# Patient Record
Sex: Male | Born: 1990 | Race: Black or African American | Hispanic: No | Marital: Single
Health system: Southern US, Community
[De-identification: ages and names within clinical notes are randomized; demographics above are authoritative.]

## PROBLEM LIST (undated history)

## (undated) DIAGNOSIS — F1910 Other psychoactive substance abuse, uncomplicated: Secondary | ICD-10-CM

## (undated) DIAGNOSIS — I469 Cardiac arrest, cause unspecified: Secondary | ICD-10-CM

---

## 2019-07-25 ENCOUNTER — Inpatient Hospital Stay (HOSPITAL_COMMUNITY)
Admission: EM | Admit: 2019-07-25 | Discharge: 2019-09-13 | DRG: 004 | Disposition: A | Discharge status: Home / Self Care | Payer: Self-pay | Attending: Internal Medicine | Admitting: Internal Medicine

## 2019-07-25 ENCOUNTER — Inpatient Hospital Stay (HOSPITAL_COMMUNITY): Payer: Self-pay

## 2019-07-25 ENCOUNTER — Emergency Department (HOSPITAL_COMMUNITY): Payer: Self-pay

## 2019-07-25 DIAGNOSIS — A419 Sepsis, unspecified organism: Secondary | ICD-10-CM | POA: Diagnosis present

## 2019-07-25 DIAGNOSIS — T50901A Poisoning by unspecified drugs, medicaments and biological substances, accidental (unintentional), initial encounter: Secondary | ICD-10-CM | POA: Diagnosis present

## 2019-07-25 DIAGNOSIS — Z781 Physical restraint status: Secondary | ICD-10-CM

## 2019-07-25 DIAGNOSIS — Z0189 Encounter for other specified special examinations: Secondary | ICD-10-CM

## 2019-07-25 DIAGNOSIS — N170 Acute kidney failure with tubular necrosis: Secondary | ICD-10-CM | POA: Diagnosis present

## 2019-07-25 DIAGNOSIS — Z20822 Contact with and (suspected) exposure to covid-19: Secondary | ICD-10-CM | POA: Diagnosis present

## 2019-07-25 DIAGNOSIS — J9690 Respiratory failure, unspecified, unspecified whether with hypoxia or hypercapnia: Secondary | ICD-10-CM

## 2019-07-25 DIAGNOSIS — F1210 Cannabis abuse, uncomplicated: Secondary | ICD-10-CM | POA: Diagnosis present

## 2019-07-25 DIAGNOSIS — R4587 Impulsiveness: Secondary | ICD-10-CM | POA: Diagnosis not present

## 2019-07-25 DIAGNOSIS — R159 Full incontinence of feces: Secondary | ICD-10-CM | POA: Diagnosis not present

## 2019-07-25 DIAGNOSIS — J9811 Atelectasis: Secondary | ICD-10-CM | POA: Diagnosis present

## 2019-07-25 DIAGNOSIS — R410 Disorientation, unspecified: Secondary | ICD-10-CM | POA: Diagnosis not present

## 2019-07-25 DIAGNOSIS — Z22322 Carrier or suspected carrier of Methicillin resistant Staphylococcus aureus: Secondary | ICD-10-CM

## 2019-07-25 DIAGNOSIS — Z9911 Dependence on respirator [ventilator] status: Secondary | ICD-10-CM

## 2019-07-25 DIAGNOSIS — D62 Acute posthemorrhagic anemia: Secondary | ICD-10-CM | POA: Diagnosis not present

## 2019-07-25 DIAGNOSIS — R1310 Dysphagia, unspecified: Secondary | ICD-10-CM | POA: Diagnosis present

## 2019-07-25 DIAGNOSIS — E870 Hyperosmolality and hypernatremia: Secondary | ICD-10-CM | POA: Diagnosis present

## 2019-07-25 DIAGNOSIS — Z4659 Encounter for fitting and adjustment of other gastrointestinal appliance and device: Secondary | ICD-10-CM

## 2019-07-25 DIAGNOSIS — Z9689 Presence of other specified functional implants: Secondary | ICD-10-CM

## 2019-07-25 DIAGNOSIS — I468 Cardiac arrest due to other underlying condition: Secondary | ICD-10-CM | POA: Diagnosis present

## 2019-07-25 DIAGNOSIS — Z930 Tracheostomy status: Secondary | ICD-10-CM

## 2019-07-25 DIAGNOSIS — M6282 Rhabdomyolysis: Secondary | ICD-10-CM | POA: Diagnosis present

## 2019-07-25 DIAGNOSIS — E860 Dehydration: Secondary | ICD-10-CM | POA: Diagnosis not present

## 2019-07-25 DIAGNOSIS — I5030 Unspecified diastolic (congestive) heart failure: Secondary | ICD-10-CM | POA: Diagnosis present

## 2019-07-25 DIAGNOSIS — F1020 Alcohol dependence, uncomplicated: Secondary | ICD-10-CM | POA: Diagnosis present

## 2019-07-25 DIAGNOSIS — I4891 Unspecified atrial fibrillation: Secondary | ICD-10-CM | POA: Diagnosis present

## 2019-07-25 DIAGNOSIS — D72829 Elevated white blood cell count, unspecified: Secondary | ICD-10-CM | POA: Diagnosis present

## 2019-07-25 DIAGNOSIS — J939 Pneumothorax, unspecified: Secondary | ICD-10-CM

## 2019-07-25 DIAGNOSIS — R680 Hypothermia, not associated with low environmental temperature: Secondary | ICD-10-CM | POA: Diagnosis present

## 2019-07-25 DIAGNOSIS — D6489 Other specified anemias: Secondary | ICD-10-CM | POA: Diagnosis not present

## 2019-07-25 DIAGNOSIS — E874 Mixed disorder of acid-base balance: Secondary | ICD-10-CM | POA: Diagnosis present

## 2019-07-25 DIAGNOSIS — F1910 Other psychoactive substance abuse, uncomplicated: Secondary | ICD-10-CM | POA: Diagnosis present

## 2019-07-25 DIAGNOSIS — F17210 Nicotine dependence, cigarettes, uncomplicated: Secondary | ICD-10-CM | POA: Diagnosis present

## 2019-07-25 DIAGNOSIS — G931 Anoxic brain damage, not elsewhere classified: Secondary | ICD-10-CM | POA: Diagnosis present

## 2019-07-25 DIAGNOSIS — N179 Acute kidney failure, unspecified: Secondary | ICD-10-CM | POA: Diagnosis present

## 2019-07-25 DIAGNOSIS — R6521 Severe sepsis with septic shock: Secondary | ICD-10-CM | POA: Diagnosis present

## 2019-07-25 DIAGNOSIS — M25511 Pain in right shoulder: Secondary | ICD-10-CM | POA: Diagnosis not present

## 2019-07-25 DIAGNOSIS — J9621 Acute and chronic respiratory failure with hypoxia: Secondary | ICD-10-CM | POA: Diagnosis not present

## 2019-07-25 DIAGNOSIS — K7200 Acute and subacute hepatic failure without coma: Secondary | ICD-10-CM | POA: Diagnosis present

## 2019-07-25 DIAGNOSIS — I469 Cardiac arrest, cause unspecified: Secondary | ICD-10-CM

## 2019-07-25 DIAGNOSIS — J690 Pneumonitis due to inhalation of food and vomit: Secondary | ICD-10-CM | POA: Diagnosis present

## 2019-07-25 DIAGNOSIS — E876 Hypokalemia: Secondary | ICD-10-CM | POA: Diagnosis not present

## 2019-07-25 DIAGNOSIS — T40602A Poisoning by unspecified narcotics, intentional self-harm, initial encounter: Principal | ICD-10-CM | POA: Diagnosis present

## 2019-07-25 DIAGNOSIS — K2970 Gastritis, unspecified, without bleeding: Secondary | ICD-10-CM | POA: Diagnosis present

## 2019-07-25 DIAGNOSIS — Z818 Family history of other mental and behavioral disorders: Secondary | ICD-10-CM

## 2019-07-25 DIAGNOSIS — K5900 Constipation, unspecified: Secondary | ICD-10-CM | POA: Diagnosis not present

## 2019-07-25 DIAGNOSIS — I10 Essential (primary) hypertension: Secondary | ICD-10-CM | POA: Clinically undetermined

## 2019-07-25 DIAGNOSIS — J189 Pneumonia, unspecified organism: Secondary | ICD-10-CM

## 2019-07-25 DIAGNOSIS — E162 Hypoglycemia, unspecified: Secondary | ICD-10-CM | POA: Diagnosis present

## 2019-07-25 DIAGNOSIS — I959 Hypotension, unspecified: Secondary | ICD-10-CM | POA: Diagnosis present

## 2019-07-25 DIAGNOSIS — J95811 Postprocedural pneumothorax: Secondary | ICD-10-CM | POA: Diagnosis present

## 2019-07-25 DIAGNOSIS — J156 Pneumonia due to other aerobic Gram-negative bacteria: Secondary | ICD-10-CM | POA: Diagnosis present

## 2019-07-25 DIAGNOSIS — Z91010 Allergy to peanuts: Secondary | ICD-10-CM

## 2019-07-25 DIAGNOSIS — I472 Ventricular tachycardia: Secondary | ICD-10-CM | POA: Diagnosis present

## 2019-07-25 DIAGNOSIS — R1319 Other dysphagia: Secondary | ICD-10-CM | POA: Diagnosis not present

## 2019-07-25 DIAGNOSIS — I110 Hypertensive heart disease with heart failure: Secondary | ICD-10-CM | POA: Diagnosis present

## 2019-07-25 DIAGNOSIS — F919 Conduct disorder, unspecified: Secondary | ICD-10-CM | POA: Diagnosis not present

## 2019-07-25 DIAGNOSIS — E875 Hyperkalemia: Secondary | ICD-10-CM

## 2019-07-25 DIAGNOSIS — T17890A Other foreign object in other parts of respiratory tract causing asphyxiation, initial encounter: Secondary | ICD-10-CM | POA: Diagnosis present

## 2019-07-25 DIAGNOSIS — Z452 Encounter for adjustment and management of vascular access device: Secondary | ICD-10-CM

## 2019-07-25 DIAGNOSIS — D6959 Other secondary thrombocytopenia: Secondary | ICD-10-CM | POA: Diagnosis present

## 2019-07-25 DIAGNOSIS — F4321 Adjustment disorder with depressed mood: Secondary | ICD-10-CM | POA: Diagnosis present

## 2019-07-25 DIAGNOSIS — J9383 Other pneumothorax: Secondary | ICD-10-CM

## 2019-07-25 DIAGNOSIS — J9601 Acute respiratory failure with hypoxia: Secondary | ICD-10-CM | POA: Diagnosis present

## 2019-07-25 DIAGNOSIS — F1410 Cocaine abuse, uncomplicated: Secondary | ICD-10-CM | POA: Diagnosis present

## 2019-07-25 DIAGNOSIS — R109 Unspecified abdominal pain: Secondary | ICD-10-CM

## 2019-07-25 DIAGNOSIS — G9341 Metabolic encephalopathy: Secondary | ICD-10-CM | POA: Diagnosis present

## 2019-07-25 DIAGNOSIS — R509 Fever, unspecified: Secondary | ICD-10-CM | POA: Diagnosis not present

## 2019-07-25 HISTORY — DX: Other psychoactive substance abuse, uncomplicated: F19.10

## 2019-07-25 HISTORY — DX: Cardiac arrest, cause unspecified: I46.9

## 2019-07-25 LAB — I-STAT VENOUS BLOOD GAS, ED
Acid-base deficit: 18 mmol/L — ABNORMAL HIGH (ref 0.0–2.0)
Bicarbonate: 16.9 mmol/L — ABNORMAL LOW (ref 20.0–28.0)
Calcium, Ion: 0.87 mmol/L — CL (ref 1.15–1.40)
HCT: 56 % — ABNORMAL HIGH (ref 39.0–52.0)
Hemoglobin: 19 g/dL — ABNORMAL HIGH (ref 13.0–17.0)
O2 Saturation: 71 %
Potassium: 6.6 mmol/L (ref 3.5–5.1)
Sodium: 140 mmol/L (ref 135–145)
TCO2: 19 mmol/L — ABNORMAL LOW (ref 22–32)
pCO2, Ven: 78 mmHg (ref 44.0–60.0)
pH, Ven: 6.943 — CL (ref 7.250–7.430)
pO2, Ven: 60 mmHg — ABNORMAL HIGH (ref 32.0–45.0)

## 2019-07-25 LAB — SODIUM, URINE, RANDOM: Sodium, Ur: 76 mmol/L

## 2019-07-25 LAB — BASIC METABOLIC PANEL
Anion gap: 9 (ref 5–15)
Anion gap: 12 (ref 5–15)
BUN: 21 mg/dL — ABNORMAL HIGH (ref 6–20)
BUN: 22 mg/dL — ABNORMAL HIGH (ref 6–20)
CO2: 29 mmol/L (ref 22–32)
CO2: 28 mmol/L (ref 22–32)
Calcium: 6.2 mg/dL — CL (ref 8.9–10.3)
Calcium: 6.6 mg/dL — ABNORMAL LOW (ref 8.9–10.3)
Chloride: 104 mmol/L (ref 98–111)
Chloride: 100 mmol/L (ref 98–111)
Creatinine, Ser: 3.16 mg/dL — ABNORMAL HIGH (ref 0.61–1.24)
Creatinine, Ser: 2.9 mg/dL — ABNORMAL HIGH (ref 0.61–1.24)
GFR calc Af Amer: 29 mL/min — ABNORMAL LOW (ref >60)
GFR calc Af Amer: 33 mL/min — ABNORMAL LOW (ref >60)
GFR calc non Af Amer: 25 mL/min — ABNORMAL LOW (ref >60)
GFR calc non Af Amer: 28 mL/min — ABNORMAL LOW (ref >60)
Glucose, Bld: 154 mg/dL — ABNORMAL HIGH (ref 70–99)
Glucose, Bld: 90 mg/dL (ref 70–99)
Potassium: >7.5 mmol/L (ref 3.5–5.1)
Potassium: 5.7 mmol/L — ABNORMAL HIGH (ref 3.5–5.1)
Sodium: 142 mmol/L (ref 135–145)
Sodium: 140 mmol/L (ref 135–145)

## 2019-07-25 LAB — CBC
HCT: 58.2 % — ABNORMAL HIGH (ref 39.0–52.0)
Hemoglobin: 17.1 g/dL — ABNORMAL HIGH (ref 13.0–17.0)
MCH: 29.3 pg (ref 26.0–34.0)
MCHC: 29.4 g/dL — ABNORMAL LOW (ref 30.0–36.0)
MCV: 99.8 fL (ref 80.0–100.0)
Platelets: 262 10*3/uL (ref 150–400)
RBC: 5.83 MIL/uL — ABNORMAL HIGH (ref 4.22–5.81)
RDW: 16.9 % — ABNORMAL HIGH (ref 11.5–15.5)
WBC: 19 10*3/uL — ABNORMAL HIGH (ref 4.0–10.5)
nRBC: 1.5 % — ABNORMAL HIGH (ref 0.0–0.2)

## 2019-07-25 LAB — URINALYSIS, ROUTINE W REFLEX MICROSCOPIC
Bilirubin Urine: NEGATIVE
Glucose, UA: 50 mg/dL — AB
Ketones, ur: NEGATIVE mg/dL
Leukocytes,Ua: NEGATIVE
Nitrite: NEGATIVE
Protein, ur: >=300 mg/dL — AB
Specific Gravity, Urine: 1.014 (ref 1.005–1.030)
pH: 5 (ref 5.0–8.0)

## 2019-07-25 LAB — HEPATIC FUNCTION PANEL
ALT: 783 U/L — ABNORMAL HIGH (ref 0–44)
AST: 2136 U/L — ABNORMAL HIGH (ref 15–41)
Albumin: 2.7 g/dL — ABNORMAL LOW (ref 3.5–5.0)
Alkaline Phosphatase: 89 U/L (ref 38–126)
Bilirubin, Direct: 0.7 mg/dL — ABNORMAL HIGH (ref 0.0–0.2)
Indirect Bilirubin: 1.7 mg/dL — ABNORMAL HIGH (ref 0.3–0.9)
Total Bilirubin: 2.4 mg/dL — ABNORMAL HIGH (ref 0.3–1.2)
Total Protein: 5.4 g/dL — ABNORMAL LOW (ref 6.5–8.1)

## 2019-07-25 LAB — BLOOD GAS, ARTERIAL
Acid-base deficit: 0.9 mmol/L (ref 0.0–2.0)
Bicarbonate: 29.3 mmol/L — ABNORMAL HIGH (ref 20.0–28.0)
Drawn by: 441371
FIO2: 100
O2 Saturation: 95.1 %
Patient temperature: 35.1
pCO2 arterial: 107 mmHg (ref 32.0–48.0)
pH, Arterial: 7.052 — CL (ref 7.350–7.450)
pO2, Arterial: 83 mmHg (ref 83.0–108.0)

## 2019-07-25 LAB — LACTIC ACID, PLASMA
Lactic Acid, Venous: 4.8 mmol/L (ref 0.5–1.9)
Lactic Acid, Venous: 4.6 mmol/L (ref 0.5–1.9)

## 2019-07-25 LAB — PROTIME-INR
INR: 1.4 — ABNORMAL HIGH (ref 0.8–1.2)
Prothrombin Time: 16.7 seconds — ABNORMAL HIGH (ref 11.4–15.2)

## 2019-07-25 LAB — I-STAT CHEM 8, ED
BUN: 24 mg/dL — ABNORMAL HIGH (ref 6–20)
Calcium, Ion: 0.89 mmol/L — CL (ref 1.15–1.40)
Chloride: 107 mmol/L (ref 98–111)
Creatinine, Ser: 4.8 mg/dL — ABNORMAL HIGH (ref 0.61–1.24)
Glucose, Bld: 55 mg/dL — ABNORMAL LOW (ref 70–99)
HCT: 55 % — ABNORMAL HIGH (ref 39.0–52.0)
Hemoglobin: 18.7 g/dL — ABNORMAL HIGH (ref 13.0–17.0)
Potassium: 6.5 mmol/L (ref 3.5–5.1)
Sodium: 141 mmol/L (ref 135–145)
TCO2: 20 mmol/L — ABNORMAL LOW (ref 22–32)

## 2019-07-25 LAB — GLUCOSE, CAPILLARY
Glucose-Capillary: 189 mg/dL — ABNORMAL HIGH (ref 70–99)
Glucose-Capillary: 142 mg/dL — ABNORMAL HIGH (ref 70–99)
Glucose-Capillary: 85 mg/dL (ref 70–99)

## 2019-07-25 LAB — MAGNESIUM: Magnesium: 2.1 mg/dL (ref 1.7–2.4)

## 2019-07-25 LAB — MRSA PCR SCREENING: MRSA by PCR: POSITIVE — AB

## 2019-07-25 LAB — RAPID URINE DRUG SCREEN, HOSP PERFORMED
Amphetamines: NOT DETECTED
Barbiturates: NOT DETECTED
Benzodiazepines: POSITIVE — AB
Cocaine: POSITIVE — AB
Opiates: NOT DETECTED
Tetrahydrocannabinol: POSITIVE — AB

## 2019-07-25 LAB — TROPONIN I (HIGH SENSITIVITY)
Troponin I (High Sensitivity): 487 ng/L (ref <18)
Troponin I (High Sensitivity): 637 ng/L (ref <18)

## 2019-07-25 LAB — VANCOMYCIN, RANDOM: Vancomycin Rm: <4

## 2019-07-25 LAB — SARS CORONAVIRUS 2 BY RT PCR (HOSPITAL ORDER, PERFORMED IN ~~LOC~~ HOSPITAL LAB): SARS Coronavirus 2: NEGATIVE

## 2019-07-25 LAB — CBG MONITORING, ED: Glucose-Capillary: 137 mg/dL — ABNORMAL HIGH (ref 70–99)

## 2019-07-25 LAB — CK: Total CK: 16274 U/L — ABNORMAL HIGH (ref 49–397)

## 2019-07-25 LAB — APTT: aPTT: 32 seconds (ref 24–36)

## 2019-07-25 LAB — ETHANOL: Alcohol, Ethyl (B): <10 mg/dL (ref <10)

## 2019-07-25 LAB — HIV ANTIBODY (ROUTINE TESTING W REFLEX): HIV Screen 4th Generation wRfx: NONREACTIVE

## 2019-07-25 LAB — CREATININE, URINE, RANDOM: Creatinine, Urine: 96.84 mg/dL

## 2019-07-25 MED ORDER — MIDAZOLAM HCL 2 MG/2ML IJ SOLN
1.0000 mg | INTRAMUSCULAR | Status: DC | PRN
  Administered 2019-07-26: 1 mg via INTRAVENOUS

## 2019-07-25 MED ORDER — HEPARIN BOLUS VIA INFUSION (CRRT)
1000.0000 [IU] | INTRAVENOUS | Status: DC | PRN
  Filled 2019-07-25: qty 1000

## 2019-07-25 MED ORDER — ETOMIDATE 2 MG/ML IV SOLN
INTRAVENOUS | Status: DC | PRN
  Administered 2019-07-25: 25 mg via INTRAVENOUS

## 2019-07-25 MED ORDER — SODIUM BICARBONATE 8.4 % IV SOLN
50.0000 meq | Freq: Once | INTRAVENOUS | Status: AC
  Administered 2019-07-25: 50 meq via INTRAVENOUS
  Filled 2019-07-25: qty 150

## 2019-07-25 MED ORDER — SODIUM CHLORIDE 0.9% FLUSH
3.0000 mL | Freq: Once | INTRAVENOUS | Status: AC
  Administered 2019-07-25: 3 mL via INTRAVENOUS

## 2019-07-25 MED ORDER — INSULIN ASPART 100 UNIT/ML ~~LOC~~ SOLN
10.0000 [IU] | Freq: Once | SUBCUTANEOUS | Status: AC
  Administered 2019-07-25: 10 [IU] via SUBCUTANEOUS

## 2019-07-25 MED ORDER — POLYETHYLENE GLYCOL 3350 17 G PO PACK: 17.0000 g | PACK | Freq: Every day | ORAL | Status: DC | PRN

## 2019-07-25 MED ORDER — HEPARIN (PORCINE) 2000 UNITS/L FOR CRRT: INTRAVENOUS_CENTRAL | Status: DC | PRN

## 2019-07-25 MED ORDER — CALCIUM GLUCONATE-NACL 1-0.675 GM/50ML-% IV SOLN
1.0000 g | Freq: Once | INTRAVENOUS | Status: AC
  Administered 2019-07-25: 1000 mg via INTRAVENOUS
  Filled 2019-07-25: qty 50

## 2019-07-25 MED ORDER — DEXTROSE 50 % IV SOLN
INTRAVENOUS | Status: AC
  Filled 2019-07-25: qty 50

## 2019-07-25 MED ORDER — FENTANYL CITRATE (PF) 100 MCG/2ML IJ SOLN: 50.0000 ug | Freq: Once | INTRAMUSCULAR | Status: DC

## 2019-07-25 MED ORDER — ROCURONIUM BROMIDE 50 MG/5ML IV SOLN
INTRAVENOUS | Status: DC | PRN
  Administered 2019-07-25: 70 mg via INTRAVENOUS

## 2019-07-25 MED ORDER — CHLORHEXIDINE GLUCONATE 0.12% ORAL RINSE (MEDLINE KIT)
15.0000 mL | Freq: Two times a day (BID) | OROMUCOSAL | Status: DC
  Administered 2019-07-25 – 2019-08-21 (×54): 15 mL via OROMUCOSAL

## 2019-07-25 MED ORDER — SODIUM BICARBONATE 8.4 % IV SOLN
INTRAVENOUS | Status: AC
  Administered 2019-07-25: 50 meq
  Filled 2019-07-25: qty 50

## 2019-07-25 MED ORDER — FENTANYL 2500MCG IN NS 250ML (10MCG/ML) PREMIX INFUSION
50.0000 ug/h | INTRAVENOUS | Status: DC
  Administered 2019-07-25 – 2019-07-26 (×2): 100 ug/h via INTRAVENOUS
  Administered 2019-07-27: 200 ug/h via INTRAVENOUS
  Administered 2019-07-27: 300 ug/h via INTRAVENOUS
  Administered 2019-07-28: 400 ug/h via INTRAVENOUS
  Filled 2019-07-25 (×5): qty 250

## 2019-07-25 MED ORDER — NOREPINEPHRINE 16 MG/250ML-% IV SOLN
0.0000 ug/min | INTRAVENOUS | Status: DC
  Administered 2019-07-25: 40 ug/min via INTRAVENOUS
  Administered 2019-07-26: 30 ug/min via INTRAVENOUS
  Administered 2019-07-27: 2 ug/min via INTRAVENOUS
  Administered 2019-07-28: 10 ug/min via INTRAVENOUS
  Administered 2019-07-28: 2 ug/min via INTRAVENOUS
  Filled 2019-07-25 (×4): qty 250

## 2019-07-25 MED ORDER — FENTANYL BOLUS VIA INFUSION
50.0000 ug | INTRAVENOUS | Status: DC | PRN
  Administered 2019-07-25 – 2019-08-03 (×8): 50 ug via INTRAVENOUS
  Filled 2019-07-25: qty 50

## 2019-07-25 MED ORDER — PANTOPRAZOLE SODIUM 40 MG IV SOLR
40.0000 mg | Freq: Every day | INTRAVENOUS | Status: DC
  Administered 2019-07-25: 40 mg via INTRAVENOUS
  Filled 2019-07-25: qty 40

## 2019-07-25 MED ORDER — HEPARIN SODIUM (PORCINE) 1000 UNIT/ML DIALYSIS
1000.0000 [IU] | INTRAMUSCULAR | Status: DC | PRN
  Administered 2019-07-26: 3000 [IU] via INTRAVENOUS_CENTRAL
  Filled 2019-07-25 (×2): qty 6
  Filled 2019-07-25: qty 3

## 2019-07-25 MED ORDER — VANCOMYCIN HCL 1500 MG/300ML IV SOLN
1500.0000 mg | Freq: Once | INTRAVENOUS | Status: AC
  Administered 2019-07-25: 1500 mg via INTRAVENOUS
  Filled 2019-07-25: qty 300

## 2019-07-25 MED ORDER — ONDANSETRON HCL 4 MG/2ML IJ SOLN
4.0000 mg | Freq: Four times a day (QID) | INTRAMUSCULAR | Status: DC | PRN
  Administered 2019-07-27 – 2019-08-17 (×5): 4 mg via INTRAVENOUS
  Filled 2019-07-25 (×5): qty 2

## 2019-07-25 MED ORDER — LACTATED RINGERS IV BOLUS: 1000.0000 mL | Freq: Once | INTRAVENOUS | Status: DC

## 2019-07-25 MED ORDER — ONDANSETRON HCL 4 MG/2ML IJ SOLN
4.0000 mg | Freq: Once | INTRAMUSCULAR | Status: AC
  Administered 2019-07-25: 4 mg via INTRAVENOUS

## 2019-07-25 MED ORDER — HEPARIN SODIUM (PORCINE) 5000 UNIT/ML IJ SOLN
5000.0000 [IU] | Freq: Three times a day (TID) | INTRAMUSCULAR | Status: DC
  Administered 2019-07-25 – 2019-07-28 (×9): 5000 [IU] via SUBCUTANEOUS
  Filled 2019-07-25 (×8): qty 1

## 2019-07-25 MED ORDER — ALTEPLASE 2 MG IJ SOLR
2.0000 mg | Freq: Once | INTRAMUSCULAR | Status: DC
  Filled 2019-07-25: qty 2

## 2019-07-25 MED ORDER — MIDAZOLAM 50MG/50ML (1MG/ML) PREMIX INFUSION
0.5000 mg/h | INTRAVENOUS | Status: DC
  Administered 2019-07-25: 0.5 mg/h via INTRAVENOUS
  Administered 2019-07-26: 1 mg/h via INTRAVENOUS
  Administered 2019-07-28: 2 mg/h via INTRAVENOUS
  Filled 2019-07-25 (×4): qty 50

## 2019-07-25 MED ORDER — SODIUM ZIRCONIUM CYCLOSILICATE 10 G PO PACK
10.0000 g | PACK | Freq: Four times a day (QID) | ORAL | Status: DC
  Administered 2019-07-25: 10 g via ORAL
  Filled 2019-07-25 (×2): qty 1

## 2019-07-25 MED ORDER — PRISMASOL BGK 4/2.5 32-4-2.5 MEQ/L REPLACEMENT SOLN
Status: DC
  Filled 2019-07-25 (×2): qty 5000

## 2019-07-25 MED ORDER — CALCIUM GLUCONATE-NACL 2-0.675 GM/100ML-% IV SOLN
2.0000 g | Freq: Once | INTRAVENOUS | Status: AC
  Administered 2019-07-25: 2000 mg via INTRAVENOUS
  Filled 2019-07-25: qty 100

## 2019-07-25 MED ORDER — STERILE WATER FOR INJECTION IV SOLN
INTRAVENOUS | Status: DC
  Filled 2019-07-25 (×3): qty 850

## 2019-07-25 MED ORDER — SODIUM CHLORIDE 0.9 % IV SOLN
250.0000 [IU]/h | INTRAVENOUS | Status: DC
  Administered 2019-07-25: 500 [IU]/h via INTRAVENOUS_CENTRAL
  Administered 2019-07-26: 700 [IU]/h via INTRAVENOUS_CENTRAL
  Filled 2019-07-25: qty 2

## 2019-07-25 MED ORDER — NOREPINEPHRINE 4 MG/250ML-% IV SOLN
0.0000 ug/min | INTRAVENOUS | Status: DC
  Administered 2019-07-25: 2 ug/min via INTRAVENOUS
  Filled 2019-07-25: qty 250

## 2019-07-25 MED ORDER — SODIUM CHLORIDE 0.9 % IV BOLUS
1000.0000 mL | Freq: Once | INTRAVENOUS | Status: AC
  Administered 2019-07-25: 1000 mL via INTRAVENOUS

## 2019-07-25 MED ORDER — VASOPRESSIN 20 UNIT/ML IV SOLN
0.0300 [IU]/min | INTRAVENOUS | Status: DC
  Administered 2019-07-26: 0.03 [IU]/min via INTRAVENOUS
  Filled 2019-07-25: qty 2

## 2019-07-25 MED ORDER — SODIUM CHLORIDE 0.9 % IV SOLN: INTRAVENOUS | Status: DC

## 2019-07-25 MED ORDER — LACTATED RINGERS IV SOLN: INTRAVENOUS | Status: DC

## 2019-07-25 MED ORDER — PRISMASOL BGK 4/2.5 32-4-2.5 MEQ/L IV SOLN
INTRAVENOUS | Status: DC
  Filled 2019-07-25 (×12): qty 5000

## 2019-07-25 MED ORDER — ORAL CARE MOUTH RINSE
15.0000 mL | OROMUCOSAL | Status: DC
  Administered 2019-07-25 – 2019-08-21 (×265): 15 mL via OROMUCOSAL

## 2019-07-25 MED ORDER — PRISMASOL BGK 4/2.5 32-4-2.5 MEQ/L REPLACEMENT SOLN
Status: DC
  Filled 2019-07-25 (×4): qty 5000

## 2019-07-25 MED ORDER — ALBUTEROL SULFATE (2.5 MG/3ML) 0.083% IN NEBU
2.5000 mg | INHALATION_SOLUTION | RESPIRATORY_TRACT | Status: DC | PRN
  Administered 2019-08-10 – 2019-08-11 (×4): 2.5 mg via RESPIRATORY_TRACT
  Filled 2019-07-25 (×4): qty 3

## 2019-07-25 MED ORDER — DEXTROSE 50 % IV SOLN
50.0000 mL | Freq: Once | INTRAVENOUS | Status: DC
  Filled 2019-07-25: qty 50

## 2019-07-25 MED ORDER — DEXTROSE 50 % IV SOLN
INTRAVENOUS | Status: AC
  Administered 2019-07-25: 50 mL
  Filled 2019-07-25: qty 50

## 2019-07-25 MED ORDER — ASPIRIN 300 MG RE SUPP
300.0000 mg | RECTAL | Status: AC
  Administered 2019-07-25: 300 mg via RECTAL
  Filled 2019-07-25: qty 1

## 2019-07-25 MED ORDER — SODIUM CHLORIDE 0.9 % IV SOLN
2.0000 g | INTRAVENOUS | Status: DC
  Administered 2019-07-25: 2 g via INTRAVENOUS
  Filled 2019-07-25: qty 2

## 2019-07-25 MED ORDER — DOCUSATE SODIUM 100 MG PO CAPS: 100.0000 mg | ORAL_CAPSULE | Freq: Two times a day (BID) | ORAL | Status: DC | PRN

## 2019-07-25 MED FILL — Medication: Qty: 1 | Status: AC

## 2019-07-25 NOTE — Progress Notes (Signed)
CRITICAL VALUE ALERT  Critical Value:  Troponin 637  Date & Time Notied:  07/25/19 1630  Provider Notified: Dr. Myrla Halsted  Orders Received/Actions taken: No new orders. Erick Blinks, RN

## 2019-07-25 NOTE — ED Provider Notes (Signed)
[facility] Provider Note   CSN: [id number] Arrival date & time: 07/25/19  1222     History Chief Complaint  Patient presents with  . Cardiac Arrest    George Kelly is a 29 y.o. male w/ unknown medical hx presenting s/p cardiac arrest in the field.  EMS reports they were called to a house as the patient was unresponsive.  He reportedly had been "drinking and partying" per EMS talking to people in the house, although they state "no one really knew him, or anything about him."  On arrival EMS reported patient had pinpoint pupils, agonal breathing, but has pulses.  They gave narcan.  Subsequently the patient went into PEA arrest and had a code run for about 5 minutes, involving chest compressions, 3 rounds of IV epinephrine, with the patient returning pulses in V Tach, at which point he was defibrillated back into sinus rhythm.  An attempt was made to place a king LMA in the field but the patient "bit down and chewed through it," so it was removed.  A nasal trumpet was placed in the field.  An IO was placed in the patient's right tibia, in addition to his 20 gauge IV in the arm.   He reportedly vomited en route after receiving bag mask ventilations.  On arrival the patient was unresponsive, and there was no immediate history further available.  HPI     No past medical history on file.  Patient Active Problem List   Diagnosis Date Noted  . Cardiac arrest ([place]) 07/25/2019    No family history on file.  Social History   Tobacco Use  . Smoking status: Not on file  Substance Use Topics  . Alcohol use: Not on file  . Drug use: Not on file    Home Medications Prior to Admission medications   Not on File    Allergies    Patient has no allergy information on record.  Review of Systems   Review of Systems  Unable to perform ROS: Acuity of condition (Unresponsive)    Physical Exam Updated Vital Signs BP 116/77   Pulse (!) 106   Temp  (!) 97 F (36.1 C) (Bladder)   Resp (!) 24   Ht '6\' 1"'  (1.854 m)   SpO2 100%   Physical Exam Constitutional:      Comments: Unresponsive, breathing heavily  HENT:     Head:     Comments: Dried blood or vomitus on lower face surrounding mouth Teeth clenched, no visible tongue laceration Pupils 3 mm and reactive Neck:     Comments: C spine collar placed on arrival Cardiovascular:     Rate and Rhythm: Regular rhythm. Tachycardia present.     Pulses: Normal pulses.  Pulmonary:     Comments: 95% with bag mask ventilation Coarse breath sounds bilaterally with rales in lower lung fields  Abdominal:     General: Abdomen is flat.  Musculoskeletal:        General: No swelling or deformity.  Skin:    General: Skin is warm and dry.  Neurological:     GCS: GCS eye subscore is 1. GCS verbal subscore is 1. GCS motor subscore is 1.     ED Results / Procedures / Treatments   Labs (all labs ordered are listed, but only abnormal results are displayed) Labs Reviewed  MRSA PCR SCREENING - Abnormal; Notable for the following components:      Result Value   MRSA by PCR  POSITIVE (*)    All other components within normal limits  CBC - Abnormal; Notable for the following components:   WBC 19.0 (*)    RBC 5.83 (*)    Hemoglobin 17.1 (*)    HCT 58.2 (*)    MCHC 29.4 (*)    RDW 16.9 (*)    nRBC 1.5 (*)    All other components within normal limits  RAPID URINE DRUG SCREEN, HOSP PERFORMED - Abnormal; Notable for the following components:   Cocaine POSITIVE (*)    Benzodiazepines POSITIVE (*)    Tetrahydrocannabinol POSITIVE (*)    All other components within normal limits  URINALYSIS, ROUTINE W REFLEX MICROSCOPIC - Abnormal; Notable for the following components:   APPearance CLOUDY (*)    Glucose, UA 50 (*)    Hgb urine dipstick LARGE (*)    Protein, ur >=300 (*)    Bacteria, UA FEW (*)    All other components within normal limits  LACTIC ACID, PLASMA - Abnormal; Notable for the  following components:   Lactic Acid, Venous 4.8 (*)    All other components within normal limits  PROTIME-INR - Abnormal; Notable for the following components:   Prothrombin Time 16.7 (*)    INR 1.4 (*)    All other components within normal limits  BLOOD GAS, ARTERIAL - Abnormal; Notable for the following components:   pH, Arterial 7.052 (*)    pCO2 arterial 107 (*)    Bicarbonate 29.3 (*)    All other components within normal limits  GLUCOSE, CAPILLARY - Abnormal; Notable for the following components:   Glucose-Capillary 189 (*)    All other components within normal limits  I-STAT VENOUS BLOOD GAS, ED - Abnormal; Notable for the following components:   pH, Ven 6.943 (*)    pCO2, Ven 78.0 (*)    pO2, Ven 60.0 (*)    Bicarbonate 16.9 (*)    TCO2 19 (*)    Acid-base deficit 18.0 (*)    Potassium 6.6 (*)    Calcium, Ion 0.87 (*)    HCT 56.0 (*)    Hemoglobin 19.0 (*)    All other components within normal limits  I-STAT CHEM 8, ED - Abnormal; Notable for the following components:   Potassium 6.5 (*)    BUN 24 (*)    Creatinine, Ser 4.80 (*)    Glucose, Bld 55 (*)    Calcium, Ion 0.89 (*)    TCO2 20 (*)    Hemoglobin 18.7 (*)    HCT 55.0 (*)    All other components within normal limits  CBG MONITORING, ED - Abnormal; Notable for the following components:   Glucose-Capillary 137 (*)    All other components within normal limits  TROPONIN I (HIGH SENSITIVITY) - Abnormal; Notable for the following components:   Troponin I (High Sensitivity) 487 (*)    All other components within normal limits  TROPONIN I (HIGH SENSITIVITY) - Abnormal; Notable for the following components:   Troponin I (High Sensitivity) 637 (*)    All other components within normal limits  SARS CORONAVIRUS 2 BY RT PCR (HOSPITAL ORDER, [place])  CULTURE, BLOOD (ROUTINE X 2)  URINE CULTURE  ETHANOL  HIV ANTIBODY (ROUTINE TESTING W REFLEX)  BASIC METABOLIC PANEL  HEPATIC FUNCTION  PANEL  MAGNESIUM  SODIUM, URINE, RANDOM  CREATININE, URINE, RANDOM  APTT  LACTIC ACID, PLASMA  BASIC METABOLIC PANEL  BASIC METABOLIC PANEL  VANCOMYCIN, RANDOM  BASIC METABOLIC PANEL  CK  BASIC METABOLIC PANEL    EKG None  Radiology CT ABDOMEN PELVIS WO CONTRAST  Result Date: 07/25/2019 CLINICAL DATA:  Chest trauma, mod-severe found down, unknown hx, level 2 trauma - please do not wait on Cr; Abdominal trauma Technologist notes state witnessed cardiac arrest. EXAM: CT CHEST, ABDOMEN AND PELVIS WITHOUT CONTRAST TECHNIQUE: Multidetector CT imaging of the chest, abdomen and pelvis was performed following the standard protocol without IV contrast. COMPARISON:  Chest radiograph earlier this day. FINDINGS: CT CHEST FINDINGS Cardiovascular: The thoracic aorta is normal in caliber. Heart is upper normal in size. No pericardial effusion. There is air in the intravascular structures. Mediastinum/Nodes: Endotracheal tube tip just above the carina. Enteric tube in place with tip in the stomach, however fluid distended esophagus. No evidence of mediastinal hematoma. No obvious adenopathy allowing for lack of IV contrast. Lungs/Pleura: Small anterior right pneumothorax, less than 10%. Diffuse tree in bud and nodular opacities throughout both lungs, pattern typical of aspiration. There is debris within the bilateral mainstem bronchi with diffuse bronchial thickening. Areas of bronchial occlusion and mucous plugging in the lower lobes. No significant pleural effusion. Musculoskeletal: No acute fracture of the ribs, sternum, included clavicles or shoulder girdles. No fracture of the thoracic spine. CT ABDOMEN PELVIS FINDINGS Hepatobiliary: No obvious hepatic injury allowing for lack of IV contrast. There is no perihepatic hematoma. Questionable intraluminal sludge in the gallbladder. No pericholecystic inflammation. Pancreas: No ductal dilatation or inflammation. Spleen: No obvious splenic injury allowing for lack  contrast. No perisplenic hematoma. Adrenals/Urinary Tract: No adrenal nodule or hemorrhage. No hydronephrosis or evidence of renal injury. Kidneys are unremarkable. The urinary bladder is decompressed by Foley catheter. No perivesicular fluid to suggest injury. Stomach/Bowel: Enteric tube tip in the stomach, intraluminal fluid dependently. No gastric thickening. Normal positioning of the ligament of Treitz. No obvious bowel injury or inflammation. No obstruction. Normal appendix. No visualized mesenteric hematoma. Vascular/Lymphatic: No retroperitoneal fluid. Normal caliber abdominal aorta. No bulky adenopathy. Reproductive: Prostate is unremarkable. Other: No free fluid or free air. Musculoskeletal: No fracture of the pelvis or lumbar spine. Hemi transitional lumbosacral anatomy. IMPRESSION: 1. Small anterior right pneumothorax, less than 10%. 2. Diffuse tree in bud and nodular opacities throughout both lungs, pattern typical of aspiration. There is debris within the bilateral mainstem bronchi with diffuse bronchial thickening. Areas of bronchial occlusion and mucous plugging in the lower lobes. 3. No evidence of acute traumatic injury to the abdomen or pelvis allowing for lack of IV contrast. 4. Endotracheal tube tip just above the carina. Enteric tube tip in the stomach, however fluid distended esophagus. Critical Value/emergent results were called by telephone at the time of interpretation on 07/25/2019 at 2:31 pm to Dr Octaviano Glow , who verbally acknowledged these results. Electronically Signed   By: Keith Rake M.D.   On: 07/25/2019 14:31   CT Head Wo Contrast  Result Date: 07/25/2019 CLINICAL DATA:  Witnessed cardiac arrest EXAM: CT HEAD WITHOUT CONTRAST CT CERVICAL SPINE WITHOUT CONTRAST TECHNIQUE: Multidetector CT imaging of the head and cervical spine was performed following the standard protocol without intravenous contrast. Multiplanar CT image reconstructions of the cervical spine were also  generated. COMPARISON:  None. FINDINGS: CT HEAD FINDINGS Brain: No evidence of acute infarction, hemorrhage, hydrocephalus, extra-axial collection or mass lesion/mass effect. Vascular: No hyperdense vessel or unexpected calcification. Skull: Normal. Negative for fracture or focal lesion. Sinuses/Orbits: Air-fluid levels are noted within the sphenoid sinus. Increased mucus is noted in the posterior nasopharynx. Other: None CT CERVICAL  SPINE FINDINGS Alignment: Within normal limits. Skull base and vertebrae: 7 cervical segments are well visualized. Vertebral body height is well maintained. No acute fracture or acute facet abnormality is noted. Endotracheal tube and gastric catheter are noted. The odontoid is within normal limits. Soft tissues and spinal canal: Surrounding soft tissue structures are within normal limits without focal hematoma. Upper chest: There is air within the left innominate vein as well as the right jugular vein likely related to recent IV start. Visualized lung fields demonstrate patchy airspace opacity bilaterally slightly greater on the right than the left this would be better evaluated on upcoming CT of the chest. Other: None IMPRESSION: CT of the head: No acute intracranial abnormality is noted. Air-fluid levels in the sphenoid sinus. CT of cervical spine: No acute bony abnormality is noted. Patchy airspace opacities bilaterally which will be better evaluated on CT of the chest. Electronically Signed   By: Inez Catalina M.D.   On: 07/25/2019 14:26   CT CHEST WO CONTRAST  Result Date: 07/25/2019 CLINICAL DATA:  Chest trauma, mod-severe found down, unknown hx, level 2 trauma - please do not wait on Cr; Abdominal trauma Technologist notes state witnessed cardiac arrest. EXAM: CT CHEST, ABDOMEN AND PELVIS WITHOUT CONTRAST TECHNIQUE: Multidetector CT imaging of the chest, abdomen and pelvis was performed following the standard protocol without IV contrast. COMPARISON:  Chest radiograph earlier  this day. FINDINGS: CT CHEST FINDINGS Cardiovascular: The thoracic aorta is normal in caliber. Heart is upper normal in size. No pericardial effusion. There is air in the intravascular structures. Mediastinum/Nodes: Endotracheal tube tip just above the carina. Enteric tube in place with tip in the stomach, however fluid distended esophagus. No evidence of mediastinal hematoma. No obvious adenopathy allowing for lack of IV contrast. Lungs/Pleura: Small anterior right pneumothorax, less than 10%. Diffuse tree in bud and nodular opacities throughout both lungs, pattern typical of aspiration. There is debris within the bilateral mainstem bronchi with diffuse bronchial thickening. Areas of bronchial occlusion and mucous plugging in the lower lobes. No significant pleural effusion. Musculoskeletal: No acute fracture of the ribs, sternum, included clavicles or shoulder girdles. No fracture of the thoracic spine. CT ABDOMEN PELVIS FINDINGS Hepatobiliary: No obvious hepatic injury allowing for lack of IV contrast. There is no perihepatic hematoma. Questionable intraluminal sludge in the gallbladder. No pericholecystic inflammation. Pancreas: No ductal dilatation or inflammation. Spleen: No obvious splenic injury allowing for lack contrast. No perisplenic hematoma. Adrenals/Urinary Tract: No adrenal nodule or hemorrhage. No hydronephrosis or evidence of renal injury. Kidneys are unremarkable. The urinary bladder is decompressed by Foley catheter. No perivesicular fluid to suggest injury. Stomach/Bowel: Enteric tube tip in the stomach, intraluminal fluid dependently. No gastric thickening. Normal positioning of the ligament of Treitz. No obvious bowel injury or inflammation. No obstruction. Normal appendix. No visualized mesenteric hematoma. Vascular/Lymphatic: No retroperitoneal fluid. Normal caliber abdominal aorta. No bulky adenopathy. Reproductive: Prostate is unremarkable. Other: No free fluid or free air.  Musculoskeletal: No fracture of the pelvis or lumbar spine. Hemi transitional lumbosacral anatomy. IMPRESSION: 1. Small anterior right pneumothorax, less than 10%. 2. Diffuse tree in bud and nodular opacities throughout both lungs, pattern typical of aspiration. There is debris within the bilateral mainstem bronchi with diffuse bronchial thickening. Areas of bronchial occlusion and mucous plugging in the lower lobes. 3. No evidence of acute traumatic injury to the abdomen or pelvis allowing for lack of IV contrast. 4. Endotracheal tube tip just above the carina. Enteric tube tip in the stomach, however fluid  distended esophagus. Critical Value/emergent results were called by telephone at the time of interpretation on 07/25/2019 at 2:31 pm to Dr Octaviano Glow , who verbally acknowledged these results. Electronically Signed   By: Keith Rake M.D.   On: 07/25/2019 14:31   CT Cervical Spine Wo Contrast  Result Date: 07/25/2019 CLINICAL DATA:  Witnessed cardiac arrest EXAM: CT HEAD WITHOUT CONTRAST CT CERVICAL SPINE WITHOUT CONTRAST TECHNIQUE: Multidetector CT imaging of the head and cervical spine was performed following the standard protocol without intravenous contrast. Multiplanar CT image reconstructions of the cervical spine were also generated. COMPARISON:  None. FINDINGS: CT HEAD FINDINGS Brain: No evidence of acute infarction, hemorrhage, hydrocephalus, extra-axial collection or mass lesion/mass effect. Vascular: No hyperdense vessel or unexpected calcification. Skull: Normal. Negative for fracture or focal lesion. Sinuses/Orbits: Air-fluid levels are noted within the sphenoid sinus. Increased mucus is noted in the posterior nasopharynx. Other: None CT CERVICAL SPINE FINDINGS Alignment: Within normal limits. Skull base and vertebrae: 7 cervical segments are well visualized. Vertebral body height is well maintained. No acute fracture or acute facet abnormality is noted. Endotracheal tube and gastric  catheter are noted. The odontoid is within normal limits. Soft tissues and spinal canal: Surrounding soft tissue structures are within normal limits without focal hematoma. Upper chest: There is air within the left innominate vein as well as the right jugular vein likely related to recent IV start. Visualized lung fields demonstrate patchy airspace opacity bilaterally slightly greater on the right than the left this would be better evaluated on upcoming CT of the chest. Other: None IMPRESSION: CT of the head: No acute intracranial abnormality is noted. Air-fluid levels in the sphenoid sinus. CT of cervical spine: No acute bony abnormality is noted. Patchy airspace opacities bilaterally which will be better evaluated on CT of the chest. Electronically Signed   By: Inez Catalina M.D.   On: 07/25/2019 14:26   DG CHEST PORT 1 VIEW  Result Date: 07/25/2019 CLINICAL DATA:  Central line placement. Right chest tube placement. EXAM: PORTABLE CHEST 1 VIEW COMPARISON:  CT and radiograph earlier this day. FINDINGS: Right subclavian central line tip in the lower SVC. New right chest tube with tip directed towards the apex. No visualized pneumothorax. Small amount subcutaneous emphysema in the right chest wall. Endotracheal tube tip remains at the clavicular heads. Enteric tube tip below the diaphragm in the stomach. Patchy reticulonodular opacities throughout both lungs with seen on CT. No significant pleural effusion. Unchanged heart size and mediastinal contours. IMPRESSION: 1. New right chest tube with tip directed towards the apex. No visualized pneumothorax. 2. Right subclavian central line tip in the lower SVC. 3. Patchy reticulonodular opacities throughout both lungs, progressed from radiographs earlier today. Electronically Signed   By: Keith Rake M.D.   On: 07/25/2019 16:25   DG Chest Portable 1 View  Result Date: 07/25/2019 CLINICAL DATA:  ETT placement. EXAM: PORTABLE CHEST 1 VIEW COMPARISON:  Single-view  of the chest earlier today. FINDINGS: Endotracheal tube is in place with the tip in good position just below the clavicular heads. Additional tube overlying the base of the neck seen on the prior examination is no longer present. Lungs clear. Heart size normal. Defibrillator pad in place. No pneumothorax or pleural effusion. IMPRESSION: ETT in good position. Lungs clear. Electronically Signed   By: Inge Rise M.D.   On: 07/25/2019 13:47   DG Chest Portable 1 View  Result Date: 07/25/2019 CLINICAL DATA:  Status post intubation. EXAM: PORTABLE CHEST 1 VIEW  COMPARISON:  None. FINDINGS: Endotracheal tube is in place with the tip in good position at the level the clavicular heads. A second tube is identified projecting over the upper neck. The tube is looped. Defibrillator pad also noted. Lungs clear. No pneumothorax or pleural effusion. Heart size normal. No acute or focal bony abnormality. IMPRESSION: Endotracheal tube in good position. Looped tube in the upper neck could be a malpositioned NG tube. Lungs clear. Electronically Signed   By: Inge Rise M.D.   On: 07/25/2019 13:02    Procedures Procedure Name: Intubation Date/Time: 07/25/2019 5:58 PM Performed by: Wyvonnia Dusky, MD Pre-anesthesia Checklist: Patient identified, Patient being monitored, Emergency Drugs available, Timeout performed and Suction available Oxygen Delivery Method: Non-rebreather mask Preoxygenation: Pre-oxygenation with 100% oxygen Induction Type: Rapid sequence, Cricoid Pressure applied and IV induction Ventilation: Mask ventilation with difficulty Laryngoscope Size: Glidescope Tube size: 7.5 mm Number of attempts: 1 Airway Equipment and Method: Video-laryngoscopy Placement Confirmation: ETT inserted through vocal cords under direct vision,  CO2 detector,  Breath sounds checked- equal and bilateral and Positive ETCO2 Secured at: 23 cm Tube secured with: ETT holder Dental Injury: Bloody posterior oropharynx      Needle decompression  Date/Time: 07/25/2019 5:59 PM Performed by: Wyvonnia Dusky, MD Authorized by: Wyvonnia Dusky, MD  Consent: The procedure was performed in an emergent situation. Site marked: the operative site was marked Required items: required blood products, implants, devices, and special equipment available Preparation: Patient was prepped and draped in the usual sterile fashion. Comments: Attempted emergent needle decompression in right mid-clavicular line with concern for possible pneuomothorax, poor ventilation and severe acidosis on ventilator.  Small amount of bloody return through 14 gauge needle port without significant release of air.    .Critical Care Performed by: Wyvonnia Dusky, MD Authorized by: Wyvonnia Dusky, MD   Critical care provider statement:    Critical care time (minutes):  55   Critical care was necessary to treat or prevent imminent or life-threatening deterioration of the following conditions:  Cardiac failure, respiratory failure and metabolic crisis   Critical care was time spent personally by me on the following activities:  Discussions with consultants, evaluation of patient's response to treatment, examination of patient, ordering and performing treatments and interventions, ordering and review of laboratory studies, ordering and review of radiographic studies, pulse oximetry, re-evaluation of patient's condition, obtaining history from patient or surrogate and review of old charts Comments:     Ventilator management, hyperkalemia treatment   (including critical care time)  Medications Ordered in ED Medications  fentaNYL (SUBLIMAZE) injection 50 mcg (has no administration in time range)  fentaNYL 2578mg in NS 2545m(1022mml) infusion-PREMIX (0 mcg/hr Intravenous Stopped 07/25/19 1542)  fentaNYL (SUBLIMAZE) bolus via infusion 50 mcg (has no administration in time range)  dextrose 50 % solution 50 mL (50 mLs Intravenous Not Given  07/25/19 1701)  dextrose 50 % solution (  Not Given 07/25/19 1702)  docusate sodium (COLACE) capsule 100 mg (has no administration in time range)  polyethylene glycol (MIRALAX / GLYCOLAX) packet 17 g (has no administration in time range)  heparin injection 5,000 Units (5,000 Units Subcutaneous Given 07/25/19 1726)  pantoprazole (PROTONIX) injection 40 mg (has no administration in time range)  ondansetron (ZOFRAN) injection 4 mg (has no administration in time range)  albuterol (PROVENTIL) (2.5 MG/3ML) 0.083% nebulizer solution 2.5 mg (has no administration in time range)  etomidate (AMIDATE) injection (25 mg Intravenous Given 07/25/19 1225)  rocuronium (ZEMURON) injection (70  mg Intravenous Given 07/25/19 1522)  norepinephrine (LEVOPHED) 16 mg in 241m premix infusion (30 mcg/min Intravenous Rate/Dose Verify 07/25/19 1700)  sodium bicarbonate 150 mEq in sterile water 1,000 mL infusion ( Intravenous New Bag/Given 07/25/19 1712)  sodium zirconium cyclosilicate (LOKELMA) packet 10 g (10 g Oral Given 07/25/19 1734)  vancomycin (VANCOREADY) IVPB 1500 mg/300 mL (1,500 mg Intravenous New Bag/Given 07/25/19 1701)  ceFEPIme (MAXIPIME) 2 g in sodium chloride 0.9 % 100 mL IVPB (2 g Intravenous New Bag/Given 07/25/19 1744)  chlorhexidine gluconate (MEDLINE KIT) (PERIDEX) 0.12 % solution 15 mL (has no administration in time range)  MEDLINE mouth rinse (15 mLs Mouth Rinse Given 07/25/19 1733)  sodium chloride flush (NS) 0.9 % injection 3 mL (3 mLs Intravenous Given 07/25/19 1306)  ondansetron (ZOFRAN) injection 4 mg (4 mg Intravenous Given 07/25/19 1255)  calcium gluconate 1 g/ 50 mL sodium chloride IVPB (0 mg Intravenous Stopped 07/25/19 1400)  sodium chloride 0.9 % bolus 1,000 mL (0 mLs Intravenous Stopped 07/25/19 1417)  insulin aspart (novoLOG) injection 10 Units (10 Units Subcutaneous Given 07/25/19 1323)  sodium bicarbonate injection 50 mEq (50 mEq Intravenous Given 07/25/19 1623)  dextrose 50 % solution (50 mLs  Given 07/25/19 1325)   sodium bicarbonate 1 mEq/mL injection (50 mEq  Given 07/25/19 1326)  aspirin suppository 300 mg (300 mg Rectal Given 07/25/19 1638)    ED Course  I have reviewed the triage vital signs and the nursing notes.  Pertinent labs & imaging results that were available during my care of the patient were reviewed by me and considered in my medical decision making (see chart for details).  29yo male presenting s/p PEA arrest in the field.  Unknown medical history as patient is unresponsive and we have no prior records.  There was concern about opioid or drug use with reported pinpoint pupils and agonal breathing on EMS's arrival.  Patient received epi x 3 in the field and defibrillated out of V Tach rhythm into NSR.  On arrival he has GCS 3, and the decision was made to intubate for airway protection.  Etomidate and rocuronium was used for intubation.  The 7.5 cuffed ETT was easily passed through the vocal cords, and positioning was confirmed above the carina on initial xray of the chest.    Subsequently, the patient had difficulties with mechanical ventilation, with Volume Control set at 600 and patient producing less than half this, breath volume output of 200.  I increased his RR from 16 bpm to 28 bpm in an effort to improve his acidosis (pH 6.9 on VBG).  We then  re-visualized the ETT tube in the correct anatomical location and noted that he was oxygenating well at 95%.  His Peak pressure was 40 with a plateau pressure in the mid 20's.  I performed a bedside ultrasound which showed absent lung sliding in the right lung field vis-a-vis the left field, which had normal sliding.  At that point the patient's I-stat venous gas demonstrated a pH of 6.9, and I felt this could very well be a pneumothorax that developed or expanded after intubation.  I attempted a bedside needle decompression with 14 gauge hollow needle at the 3rd intercostal space mid-clavicular, right side.  Small amount of bloody bubbling from  needle without release or hiss of air.  Subsequent xray showed no evidence of PTX.  I then called the ICU and requested an urgent bedside consult.  I was unsure if the patient was stable to travel to CT  with this tenuous ventilation status.    Additionally, his ECG showed peaked T waves and his K+ was elevated on I-Stat, and so I ordered IV calcium gluconate for cardiac stabilization.    The ICU team arrived as noted below and assumed direct care of the patient.  Efforts were being made by ED staff to further identify the patient and family contacts.  No family contacts were available to me at the time of his ED presentation.  Clinical Course as of Jul 24 1808  Wed Jul 25, 2019  1312 ICU team and attending now at bedside.  Tidal volumes remain around 300.  Ppressure was 39, plateau pressure 24.  On bedside ultrasound I did not see lung sliding in the right lung fields (compared to left which had clear sliding).  His pH is 6.9 with pCO2 70's, I felt this was emergent and subsequently attempted a needle decompression for suspected PTX.  Repeat xray showed no developed interval PTX and ETT in correct anatomical location (no left mainstem intubation).  RR increased from 16 bpm to 28 bpm.  ICU team now assuming care, planning to transport to CT.  ED team still attempting to find contact/family information for patient.  He remains sedated on fentanyl   [MT]    Clinical Course User Index [MT] Fintan Grater, Carola Rhine, MD    Final Clinical Impression(s) / ED Diagnoses Final diagnoses:  Acute renal failure (ARF) ([place])  Encounter for central line placement    Rx / DC Orders ED Discharge Orders    None       Wyvonnia Dusky, MD 07/25/19 1811

## 2019-07-25 NOTE — Progress Notes (Deleted)
eLink Physician-Brief Progress Note Patient Name: George Kelly DOB: 06/15/1990 MRN: [id number]   Date of Service  07/25/2019  HPI/Events of Note  20 seconds of seizure-like abnormal movements, bedside RN also requesting a foley catheter for closer monitoring of urine output.  eICU Interventions  Overnight EEG with video ordered, order to insert foley catheter entered.     Intervention Category Major Interventions: Hypotension - evaluation and management;Shock - evaluation and management  Migdalia Dk 07/25/2019, 11:52 PM

## 2019-07-25 NOTE — ED Notes (Signed)
Code cool called. Target temp 36 degreess

## 2019-07-25 NOTE — Procedures (Signed)
°  Procedure: Right femoral Central line insertion/dialysis line/trialysis catheter Indication: Hyperkalemia  Findings: Due to emergency in no contact and under a septic technique and full barrier right femoral dialysis catheter was inserted (due to patient having neck collar)  from first stick there was good blood flow from all 3 ports.  Dialysis catheter was flushed and was sutured in place there is no complications there was no bleeding.Marland Kitchen

## 2019-07-25 NOTE — Progress Notes (Signed)
EEG complete - results pending 

## 2019-07-25 NOTE — Procedures (Addendum)
Patient Name: George Kelly  MRN: [id number]  Epilepsy Attending: Charlsie Quest  Referring Physician/Provider: Raymon Mutton, NP Date: 07/25/2019 Duration: 21.20 mins  Patient history: 29 year old male presented after cardiac arrest,  now on TTM.  EEG to evaluate for seizures.  Level of alertness: Comatose  AEDs during EEG study: Versed  Technical aspects: This EEG study was done with scalp electrodes positioned according to the 10-20 International system of electrode placement. Electrical activity was acquired at a sampling rate of 500Hz  and reviewed with a high frequency filter of 70Hz  and a low frequency filter of 1Hz . EEG data were recorded continuously and digitally stored.   Description: EEG showed continuous generalized 3 to 6 Hz theta-delta slowing. Reactivity was not tested during this study. Hyperventilation and photic stimulation were not performed.     ABNORMALITY -Continuous slow, generalized  IMPRESSION: This study is suggestive of severe diffuse encephalopathy, nonspecific etiology. No seizures or epileptiform discharges were seen throughout the recording.  [name] 
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
## 2019-07-25 NOTE — ED Notes (Signed)
  Pt transported to ct 

## 2019-07-25 NOTE — ED Notes (Signed)
Ccm at bedside to troubleshoot airway.

## 2019-07-25 NOTE — Progress Notes (Signed)
eLink Physician-Brief Progress Note Patient Name: George Kelly DOB: 06/15/1990 MRN: [id number]   Date of Service  07/25/2019  HPI/Events of Note  Pt needs a dialysis catheter for emergent dialysis for hyperkalemia of 7.5 that is resistant to more conservative measures.  eICU Interventions  Bedside informed and will insert catheter.        Thomasene Lot [name] 07/25/2019, 7:48 PM

## 2019-07-25 NOTE — Progress Notes (Signed)
Patient transported to CT, back to trauma A and then to 3M07.

## 2019-07-25 NOTE — Progress Notes (Signed)
Critical ABG values given to Dr. Myrla Halsted.     Results for George Kelly, George Kelly (MRN [id number]) as of 07/25/2019 17:43  Ref. Range 07/25/2019 16:24  pH, Arterial Latest Ref Range: 7.350 - 7.450  7.052 (LL)  pCO2 arterial Latest Ref Range: 32.0 - 48.0 mmHg 107 (HH)  pO2, Arterial Latest Ref Range: 83.0 - 108.0 mmHg 83.0  Acid-base deficit Latest Ref Range: 0.0 - 2.0 mmol/L 0.9  Bicarbonate Latest Ref Range: 20.0 - 28.0 mmol/L 29.3 (H)  O2 Saturation Latest Units: % 95.1  Patient temperature Unknown 35.1

## 2019-07-25 NOTE — Progress Notes (Signed)
CRITICAL VALUE ALERT  Critical Value:  Lactic acid 4.8  Date & Time Notied:  07/25/19 1621  Provider Notified: Dr. Myrla Halsted  Orders Received/Actions taken: No new orders.   Erick Blinks, RN

## 2019-07-25 NOTE — Procedures (Signed)
Arterial Catheter Insertion Procedure Note James Senn [id number] 06/15/1990  Procedure: Insertion of Arterial Catheter  Indications: Blood pressure monitoring  Procedure Details Consent: Unable to obtain consent because of emergent medical necessity. Time Out: Verified patient identification, verified procedure, site/side was marked, verified correct patient position, special equipment/implants available, medications/allergies/relevent history reviewed, required imaging and test results available.  Performed  Maximum sterile technique was used including antiseptics, cap, gloves, gown, hand hygiene, mask and sheet. Skin prep: Chlorhexidine; local anesthetic administered 20 gauge catheter was inserted into left radial artery using the Seldinger technique. ULTRASOUND GUIDANCE USED: YES Evaluation Blood flow good; BP tracing good. Complications: No apparent complications.   Lorin Glass 07/25/2019

## 2019-07-25 NOTE — ED Notes (Signed)
Pt bp dropped to 70's at ct.  Fentanyl stopped.  .9 ns bolus started.  MD notified and pt started on levo drip immediately upon return to room.

## 2019-07-25 NOTE — Progress Notes (Signed)
eLink Physician-Brief Progress Note Patient Name: George Kelly DOB: 06/15/1990 MRN: [id number]   Date of Service  07/25/2019  HPI/Events of Note  Patient is a code cool on Fentanyl alone for sedation due to hypotension on high dose of pressors and need for interval neurological assessment, however he is agitated and dyssynchronous on the ventilator with sub-optimal sedation.  eICU Interventions  Low dose Versed infusion 0.5 - 2 mg / hour ordered with 1-2 mg iv Q 4 hour boluses prn agitation and sub-optimal sedation, hopefully this produces mild to moderate sedation while allowing interval neurological assessment, a secondary consideration was hemodynamic stability with  Propofol of Precedex judged too risky right now.        Thomasene Lot Maleko Greulich 07/25/2019, 9:59 PM

## 2019-07-25 NOTE — Progress Notes (Signed)
CRITICAL VALUE ALERT  Critical Value:  Calcium 6.2 and K > than 7  Date & Time Notied:  07/25/19 1855  Provider Notified: Dr. Katrinka Blazing and Dr. Allena Katz  Orders Received/Actions taken: calcium gluconate 2g ordered.   Erick Blinks, RN

## 2019-07-25 NOTE — Progress Notes (Signed)
Pharmacy Antibiotic Note  George Kelly is a 29 y.o. male admitted on 07/25/2019 with sepsis.  Pharmacy has been consulted for vancomycin and cefepime dosing.  Orders to start CRRT tonight.  Plan: Vancomycin 1500 mg x 1 now. Cefepime 2g IV q 24 hrs F/u in AM - if tolerating CRRT well can increase cefepime dose. Vancomycin random level in AM for determining further dosing.  Height: 6\' 1"  (185.4 cm) Weight: 86.9 kg (191 lb 9.3 oz) IBW/kg (Calculated) : 79.9  Temp (24hrs), Avg:96 F (35.6 C), Min:95 F (35 C), Max:97.2 F (36.2 C)  Recent Labs  Lab 07/25/19 1234 07/25/19 1240 07/25/19 1540 07/25/19 1643 07/25/19 1715 07/25/19 1810  WBC 19.0*  --   --   --   --   --   CREATININE  --  4.80* QUESTIONABLE RESULTS, RECOMMEND RECOLLECT TO VERIFY  --   --  3.16*  LATICACIDVEN  --   --  4.8*  --  4.6*  --   VANCORANDOM  --   --   --  <4  --   --     Estimated Creatinine Clearance: 39.3 mL/min (A) (by C-G formula based on SCr of 3.16 mg/dL (H)).    Not on File  Antimicrobials this admission:  Vancomycin 6/9>  Cefepime 6/9>   Dose adjustments this admission:   Microbiology results:  6/9 BCx x 2:  6/9 COID - Neg 6/9 MRSA PCR:   Thank you for allowing pharmacy to be a part of this patient's care.  8/9, Reece Leader, BCCP Clinical Pharmacist  07/25/2019 8:51 PM   [facility] pharmacy phone numbers are listed on [email or web address]

## 2019-07-25 NOTE — H&P (Addendum)
NAME:  George Kelly, MRN:  [id number], DOB:  06/15/1990, LOS: 0 ADMISSION DATE:  07/25/2019, CONSULTATION DATE:  07/25/2019 REFERRING MD:  Dr. Renaye Rakers, CHIEF COMPLAINT:  Cardiac arrest    Brief History   29 year old male initially found unresponsive in the field and received Narcan due to concerns of drug overdose after which patient suffered a cardiac arrest. Presented with nasal trumpet in place being bagged.   History of present illness   George Kelly is a 29yo male with no known past medical history that presented to ED after witnessed cardiac arrest. Estimated down time of . Per EMS called was made after patient was found unresponsive at a house party. On arrival he was found unresponsive but patient has a heart rate, due to concern for drug overdose patient received narcan at which time he suffered a cardiac arrest. Unknown rhythm at time of arrest. Estimated downtime was 15 mins.   Full workup pending at time of admission but STAT labs revealed potassium of 6.5, creatinine 4.80, ionized calcium 0.89, and hgb 18.7. All other labs currently pending. ABG on arrival 6.9 / 78.0 / 60 / 16.9. EKG with sinus tachycardiac with wide complex QRS. UDS positive for Benzodiazepines, cocaine, and TSH.  PCCM consulted on arrival for further management with concern for ventilations due to mismatch of return volumes. On arrival EDP hade preformed needle decompression due to concerns of a right pneumothorax. Patient placed on pressure control ventilation with slight improvement in return volumes and urgently taken to CT.     Past Medical History  Substance abuse   Significant Hospital Events   Admitted after cardiac arrest 6/9  Consults:  Cardiology  Nephrology  Procedures:  HD cath pending 6/9 A-line pending 6/9  Significant Diagnostic Tests:    Micro Data:  COVID 6/9 > Blood culture 6/9 > Urine culture 6/9 >  Antimicrobials:     Interim history/subjective:  Unresponsive on  vent  Objective   Height 6\' 1"  (1.854 m).       No intake or output data in the 24 hours ending 07/25/19 1321 There were no vitals filed for this visit.  Examination: General: Adult male unresponsive on vent n NAD HEENT: ETT, MM pink/moist, PERRL, sclera non-icteric  Neuro: Unresponsive  CV: s1s2 regular rate and rhythm, no murmur, rubs, or gallops,  PULM:  Clear to ascultation bilaterally, needle decompression site with 3 way dressing in place, tolerating vent but continues with mismatched return volumes  GI: soft, bowel sounds active in all 4 quadrants, non-tender, non-distended Extremities: warm/dry, no edema  Skin: no rashes or lesions   Resolved Hospital Problem list     Assessment & Plan:  Cardiac arrest -likely in the setting of cardiac arrest   Circulatory shock -15 minutes downtime, witnessed arrest with immediate CPR started   P: Start TTM, goal 36 degrees. Place CVL, arterial line. Start levophed PRN, goal MAP > 65 Assess CVP's, echo, UDS. Trend troponin, lactate. Will consult cardiology   Acute hypoxic respiratory failure  - In the setting of cardiac arrest  P: Continue ventilator support with lung protective strategies  Wean PEEP and FiO2 for sats greater than 90%. Head of bed elevated 30 degrees. Plateau pressures less than 30 cm H20.  Follow intermittent chest x-ray and ABG.   Hold SBT until off NMB. Ensure adequate pulmonary hygiene  Follow cultures  VAP bundle in place  PAD protocol  Acute Kidney Injury  -in the setting of cardiac arrest. Baseline creatinine is unknown ,  creatinine on admission 4.80 P: Consult cardiology  Follow renal function / urine output Trend Bmet Avoid nephrotoxins, ensure adequate renal perfusion  IV hydration Obtain urine lytes  Renal ultrasound   At risk for anoxic encephalopathy. -However patient had short down time with immediate invitation of CPR  P: Sedation Propofol and PRN Fentanyl as needed  RASS goal 0  to -1 Assess EEG Neuro consult once rewarmed   At risk for multiple metabolic derangements during cooling. P: NS @ 100 Correct electrolytes as indicated. BMP q12hrs.  Supplement as needed     At risk for hyperglycemia during cooling. P: ICU hyperglycemia protocol. SSI CBG ACHS   Polysubstance abuse  -Found down at house party with high suspicion for substance abuse  -UDS positive for Benzodiazepines, cocaine, and TSH. P: Supportive care  Cessation education when appropriate   Best practice:  Diet: NPO Pain/Anxiety/Delirium protocol (if indicated): PRN Fentamyl VAP protocol (if indicated): In place DVT prophylaxis: Subq heparin  GI prophylaxis: PPI Glucose control: SSI Mobility: Bedrest  Code Status: Full Family Communication: None at bedside will update once identified  Disposition: ICU   Labs   CBC: Recent Labs  Lab 07/25/19 1240 07/25/19 1242  HGB 18.7* 19.0*  HCT 55.0* 56.0*    Basic Metabolic Panel: Recent Labs  Lab 07/25/19 1240 07/25/19 1242  NA 141 140  K 6.5* 6.6*  CL 107  --   GLUCOSE 55*  --   BUN 24*  --   CREATININE 4.80*  --    GFR: CrCl cannot be calculated (Unknown ideal weight.). No results for input(s): PROCALCITON, WBC, LATICACIDVEN in the last 168 hours.  Liver Function Tests: No results for input(s): AST, ALT, ALKPHOS, BILITOT, PROT, ALBUMIN in the last 168 hours. No results for input(s): LIPASE, AMYLASE in the last 168 hours. No results for input(s): AMMONIA in the last 168 hours.  ABG    Component Value Date/Time   HCO3 16.9 (L) 07/25/2019 1242   TCO2 19 (L) 07/25/2019 1242   ACIDBASEDEF 18.0 (H) 07/25/2019 1242   O2SAT 71.0 07/25/2019 1242     Coagulation Profile: No results for input(s): INR, PROTIME in the last 168 hours.  Cardiac Enzymes: No results for input(s): CKTOTAL, CKMB, CKMBINDEX, TROPONINI in the last 168 hours.  HbA1C: No results found for: HGBA1C  CBG: No results for input(s): GLUCAP in the last  168 hours.  Review of Systems:   Unresponsive   Past Medical History  He,  has no past medical history on file.   Surgical History      Social History      Family History   His family history is not on file.   Allergies Not on File   Home Medications  Prior to Admission medications   Not on File     Critical care time:    Performed by: Johnsie Cancel  Total critical care time: 50 minutes  Critical care time was exclusive of separately billable procedures and treating other patients.  Critical care was necessary to treat or prevent imminent or life-threatening deterioration.  Critical care was time spent personally by me on the following activities: development of treatment plan with patient and/or surrogate as well as nursing, discussions with consultants, evaluation of patient's response to treatment, examination of patient, obtaining history from patient or surrogate, ordering and performing treatments and interventions, ordering and review of laboratory studies, ordering and review of radiographic studies, pulse oximetry and re-evaluation of patient's condition.  Johnsie Cancel,  NP-C [facility] Contact / Pager information can be found on Amion  07/25/2019, 2:18 PM

## 2019-07-25 NOTE — Progress Notes (Signed)
vLTM started used same leads for routine and vLTM. Neurology notified.  Event button tested.

## 2019-07-25 NOTE — Procedures (Signed)
Central Venous Catheter Insertion Procedure Note George Kelly [id number] 06/15/1990  Procedure: Insertion of Central Venous Catheter Indications: Assessment of intravascular volume and Drug and/or fluid administration  Procedure Details Consent: Unable to obtain consent because of emergent medical necessity. Time Out: Verified patient identification, verified procedure, site/side was marked, verified correct patient position, special equipment/implants available, medications/allergies/relevent history reviewed, required imaging and test results available.  Performed  Maximum sterile technique was used including antiseptics, cap, gloves, gown, hand hygiene, mask and sheet. Skin prep: Chlorhexidine; local anesthetic administered A antimicrobial bonded/coated triple lumen catheter was placed in the right subclavian vein using the Seldinger technique.  Evaluation Blood flow good Complications: No apparent complications Patient did tolerate procedure well. Chest X-ray ordered to verify placement.  CXR: normal.  Chilton Greathouse MD [facility] Please see [email or web address] for pager details.  07/25/2019, 4:52 PM

## 2019-07-25 NOTE — Procedures (Signed)
Chest Tube Insertion Procedure Note  Indications:  Clinically significant Pneumothorax  Pre-operative Diagnosis: Pneumothorax  Post-operative Diagnosis: Pneumothorax  Procedure Details  Informed consent was obtained for the procedure, including sedation.  Risks of lung perforation, hemorrhage, arrhythmia, and adverse drug reaction were discussed.   After sterile skin prep, using standard technique, a 32 French tube was placed in the right anterior 8th rib space.  Findings: None  Estimated Blood Loss:  Minimal         Specimens:  None              Complications:  None; patient tolerated the procedure well.

## 2019-07-25 NOTE — Progress Notes (Signed)
eLink Physician-Brief Progress Note Patient Name: George Kelly DOB: 06/15/1990 MRN: [id number]   Date of Service  07/25/2019  HPI/Events of Note  Hypotension on CRRT with MAP in the 40's.  eICU Interventions  Vasopressin added.        Thomasene Lot Treyvon Blahut 07/25/2019, 11:48 PM

## 2019-07-25 NOTE — ED Notes (Signed)
Ice placed 1355

## 2019-07-25 NOTE — ED Triage Notes (Signed)
Pt here via [facility] post cardiac arrest.  They were called for an unresponsive at a party.  Pt smelled of etoh with pinpoint pupils.  While ems was giving 2 mg narcan, pt went into asystole.  CPR performed for 5 min with 2 epi's given.  Pt then went into v-tach and returned to nsr.  King airway was placed and pt began biting tube.  Given 2 versed and 50 mcg fentanyl.

## 2019-07-26 ENCOUNTER — Inpatient Hospital Stay (HOSPITAL_COMMUNITY): Payer: Self-pay

## 2019-07-26 ENCOUNTER — Encounter (HOSPITAL_COMMUNITY): Payer: Self-pay | Admitting: Pulmonary Disease

## 2019-07-26 DIAGNOSIS — J9601 Acute respiratory failure with hypoxia: Secondary | ICD-10-CM

## 2019-07-26 DIAGNOSIS — I469 Cardiac arrest, cause unspecified: Secondary | ICD-10-CM

## 2019-07-26 LAB — COMPREHENSIVE METABOLIC PANEL
ALT: 610 U/L — ABNORMAL HIGH (ref 0–44)
AST: 912 U/L — ABNORMAL HIGH (ref 15–41)
Albumin: 2.1 g/dL — ABNORMAL LOW (ref 3.5–5.0)
Alkaline Phosphatase: 54 U/L (ref 38–126)
Anion gap: 13 (ref 5–15)
BUN: 28 mg/dL — ABNORMAL HIGH (ref 6–20)
CO2: 24 mmol/L (ref 22–32)
Calcium: 6 mg/dL — CL (ref 8.9–10.3)
Chloride: 107 mmol/L (ref 98–111)
Creatinine, Ser: 2.72 mg/dL — ABNORMAL HIGH (ref 0.61–1.24)
GFR calc Af Amer: 35 mL/min — ABNORMAL LOW (ref >60)
GFR calc non Af Amer: 30 mL/min — ABNORMAL LOW (ref >60)
Glucose, Bld: 83 mg/dL (ref 70–99)
Potassium: 4.3 mmol/L (ref 3.5–5.1)
Sodium: 144 mmol/L (ref 135–145)
Total Bilirubin: 3.7 mg/dL — ABNORMAL HIGH (ref 0.3–1.2)
Total Protein: 4.4 g/dL — ABNORMAL LOW (ref 6.5–8.1)

## 2019-07-26 LAB — URINE CULTURE: Culture: NO GROWTH

## 2019-07-26 LAB — POCT I-STAT 7, (LYTES, BLD GAS, ICA,H+H)
Acid-base deficit: 6 mmol/L — ABNORMAL HIGH (ref 0.0–2.0)
Bicarbonate: 23.8 mmol/L (ref 20.0–28.0)
Calcium, Ion: 1.03 mmol/L — ABNORMAL LOW (ref 1.15–1.40)
HCT: 53 % — ABNORMAL HIGH (ref 39.0–52.0)
Hemoglobin: 18 g/dL — ABNORMAL HIGH (ref 13.0–17.0)
O2 Saturation: 92 %
Potassium: 4.1 mmol/L (ref 3.5–5.1)
Sodium: 146 mmol/L — ABNORMAL HIGH (ref 135–145)
TCO2: 26 mmol/L (ref 22–32)
pCO2 arterial: 62.1 mmHg — ABNORMAL HIGH (ref 32.0–48.0)
pH, Arterial: 7.192 — CL (ref 7.350–7.450)
pO2, Arterial: 79 mmHg — ABNORMAL LOW (ref 83.0–108.0)

## 2019-07-26 LAB — BASIC METABOLIC PANEL
Anion gap: 11 (ref 5–15)
BUN: 23 mg/dL — ABNORMAL HIGH (ref 6–20)
CO2: 27 mmol/L (ref 22–32)
Calcium: 6.6 mg/dL — ABNORMAL LOW (ref 8.9–10.3)
Chloride: 108 mmol/L (ref 98–111)
Creatinine, Ser: 2.67 mg/dL — ABNORMAL HIGH (ref 0.61–1.24)
GFR calc Af Amer: 36 mL/min — ABNORMAL LOW (ref >60)
GFR calc non Af Amer: 31 mL/min — ABNORMAL LOW (ref >60)
Glucose, Bld: 95 mg/dL (ref 70–99)
Potassium: 4.9 mmol/L (ref 3.5–5.1)
Sodium: 146 mmol/L — ABNORMAL HIGH (ref 135–145)

## 2019-07-26 LAB — RENAL FUNCTION PANEL
Albumin: 2.5 g/dL — ABNORMAL LOW (ref 3.5–5.0)
Anion gap: 12 (ref 5–15)
BUN: 24 mg/dL — ABNORMAL HIGH (ref 6–20)
CO2: 25 mmol/L (ref 22–32)
Calcium: 6.8 mg/dL — ABNORMAL LOW (ref 8.9–10.3)
Chloride: 107 mmol/L (ref 98–111)
Creatinine, Ser: 2.57 mg/dL — ABNORMAL HIGH (ref 0.61–1.24)
GFR calc Af Amer: 38 mL/min — ABNORMAL LOW (ref >60)
GFR calc non Af Amer: 33 mL/min — ABNORMAL LOW (ref >60)
Glucose, Bld: 100 mg/dL — ABNORMAL HIGH (ref 70–99)
Phosphorus: 4.5 mg/dL (ref 2.5–4.6)
Potassium: 3.8 mmol/L (ref 3.5–5.1)
Sodium: 144 mmol/L (ref 135–145)

## 2019-07-26 LAB — GLUCOSE, CAPILLARY
Glucose-Capillary: 102 mg/dL — ABNORMAL HIGH (ref 70–99)
Glucose-Capillary: 118 mg/dL — ABNORMAL HIGH (ref 70–99)
Glucose-Capillary: 67 mg/dL — ABNORMAL LOW (ref 70–99)
Glucose-Capillary: 83 mg/dL (ref 70–99)
Glucose-Capillary: 90 mg/dL (ref 70–99)
Glucose-Capillary: 94 mg/dL (ref 70–99)
Glucose-Capillary: 95 mg/dL (ref 70–99)

## 2019-07-26 LAB — MAGNESIUM
Magnesium: 1.7 mg/dL (ref 1.7–2.4)
Magnesium: 1.7 mg/dL (ref 1.7–2.4)
Magnesium: 1.3 mg/dL — ABNORMAL LOW (ref 1.7–2.4)

## 2019-07-26 LAB — APTT: aPTT: 53 seconds — ABNORMAL HIGH (ref 24–36)

## 2019-07-26 LAB — ECHOCARDIOGRAM COMPLETE
Height: 73 in
Weight: 3068.8 oz

## 2019-07-26 LAB — LACTIC ACID, PLASMA
Lactic Acid, Venous: 4.5 mmol/L (ref 0.5–1.9)
Lactic Acid, Venous: 5.4 mmol/L (ref 0.5–1.9)

## 2019-07-26 LAB — CORTISOL: Cortisol, Plasma: 59.9 ug/dL

## 2019-07-26 LAB — PHOSPHORUS: Phosphorus: 4.7 mg/dL — ABNORMAL HIGH (ref 2.5–4.6)

## 2019-07-26 LAB — VANCOMYCIN, RANDOM: Vancomycin Rm: 9

## 2019-07-26 MED ORDER — PIPERACILLIN-TAZOBACTAM 3.375 G IVPB 30 MIN
3.3750 g | Freq: Four times a day (QID) | INTRAVENOUS | Status: DC
  Administered 2019-07-26: 3.375 g via INTRAVENOUS
  Filled 2019-07-26 (×2): qty 50

## 2019-07-26 MED ORDER — SODIUM CHLORIDE 0.45 % IV SOLN: INTRAVENOUS | Status: DC

## 2019-07-26 MED ORDER — PRO-STAT SUGAR FREE PO LIQD: 30.0000 mL | Freq: Three times a day (TID) | ORAL | Status: DC

## 2019-07-26 MED ORDER — VANCOMYCIN VARIABLE DOSE PER UNSTABLE RENAL FUNCTION (PHARMACIST DOSING): Status: DC

## 2019-07-26 MED ORDER — PANTOPRAZOLE SODIUM 40 MG PO PACK: 40.0000 mg | PACK | ORAL | Status: DC

## 2019-07-26 MED ORDER — STERILE WATER FOR INJECTION IV SOLN
INTRAVENOUS | Status: DC
  Filled 2019-07-26 (×3): qty 850

## 2019-07-26 MED ORDER — VITAL HIGH PROTEIN PO LIQD: 1000.0000 mL | ORAL | Status: DC

## 2019-07-26 MED ORDER — MUPIROCIN 2 % EX OINT
1.0000 "application " | TOPICAL_OINTMENT | Freq: Two times a day (BID) | CUTANEOUS | Status: AC
  Administered 2019-07-26 – 2019-07-30 (×10): 1 via NASAL
  Filled 2019-07-26 (×3): qty 22

## 2019-07-26 MED ORDER — PANTOPRAZOLE SODIUM 40 MG IV SOLR
40.0000 mg | INTRAVENOUS | Status: DC
  Administered 2019-07-26: 40 mg via INTRAVENOUS
  Filled 2019-07-26: qty 40

## 2019-07-26 MED ORDER — DEXTROSE 50 % IV SOLN
12.5000 g | INTRAVENOUS | Status: AC
  Filled 2019-07-26: qty 50

## 2019-07-26 MED ORDER — STERILE WATER FOR INJECTION IV SOLN: INTRAVENOUS | Status: DC

## 2019-07-26 MED ORDER — PIPERACILLIN-TAZOBACTAM IN DEX 2-0.25 GM/50ML IV SOLN
2.2500 g | Freq: Three times a day (TID) | INTRAVENOUS | Status: DC
  Administered 2019-07-26 – 2019-07-28 (×5): 2.25 g via INTRAVENOUS
  Filled 2019-07-26 (×6): qty 50

## 2019-07-26 MED ORDER — MAGNESIUM SULFATE 4 GM/100ML IV SOLN
4.0000 g | Freq: Once | INTRAVENOUS | Status: AC
  Administered 2019-07-27: 4 g via INTRAVENOUS
  Filled 2019-07-26: qty 100

## 2019-07-26 MED ORDER — VANCOMYCIN HCL IN DEXTROSE 1-5 GM/200ML-% IV SOLN
1000.0000 mg | Freq: Once | INTRAVENOUS | Status: AC
  Administered 2019-07-26: 1000 mg via INTRAVENOUS
  Filled 2019-07-26: qty 200

## 2019-07-26 MED ORDER — VITAL 1.5 CAL PO LIQD
1000.0000 mL | ORAL | Status: DC
  Filled 2019-07-26: qty 1000

## 2019-07-26 MED ORDER — CALCIUM GLUCONATE-NACL 2-0.675 GM/100ML-% IV SOLN
2.0000 g | Freq: Once | INTRAVENOUS | Status: AC
  Administered 2019-07-27: 2000 mg via INTRAVENOUS
  Filled 2019-07-26: qty 100

## 2019-07-26 MED ORDER — MAGNESIUM SULFATE 2 GM/50ML IV SOLN
2.0000 g | Freq: Once | INTRAVENOUS | Status: DC
  Filled 2019-07-26: qty 50

## 2019-07-26 MED ORDER — ALBUMIN HUMAN 5 % IV SOLN
25.0000 g | Freq: Once | INTRAVENOUS | Status: AC
  Administered 2019-07-27: 25 g via INTRAVENOUS
  Filled 2019-07-26: qty 500

## 2019-07-26 MED ORDER — CHLORHEXIDINE GLUCONATE CLOTH 2 % EX PADS
6.0000 | MEDICATED_PAD | Freq: Every day | CUTANEOUS | Status: DC
  Administered 2019-07-26 – 2019-09-13 (×48): 6 via TOPICAL

## 2019-07-26 MED ORDER — DEXTROSE 50 % IV SOLN
INTRAVENOUS | Status: AC
  Administered 2019-07-26: 12.5 g via INTRAVENOUS
  Filled 2019-07-26: qty 50

## 2019-07-26 MED ORDER — PRO-STAT SUGAR FREE PO LIQD: 30.0000 mL | Freq: Two times a day (BID) | ORAL | Status: DC

## 2019-07-26 NOTE — Progress Notes (Signed)
eLink Physician-Brief Progress Note Patient Name: George Kelly DOB: 06/15/1990 MRN: [id number]   Date of Service  07/26/2019  HPI/Events of Note  Patient needs follow up labs.  eICU Interventions  CMET and Mg++ ordered.        Migdalia Dk 07/26/2019, 8:44 PM

## 2019-07-26 NOTE — Progress Notes (Signed)
EEG maint complete. No skin breakdown at Fp1 Fp2 F7 P3

## 2019-07-26 NOTE — Progress Notes (Signed)
CRITICAL VALUE ALERT  Critical Value:  Calcium 6.0  Date & Time Notied:  07/26/19 2305   Provider Notified: Warrick Parisian, MD   Orders Received/Actions taken: Acknowledged. Awaiting orders.   Barbaraann Cao, RN  07/26/19 2310

## 2019-07-26 NOTE — Progress Notes (Signed)
NAME:  George Kelly, MRN:  [id number], DOB:  06/15/1990, LOS: 1 ADMISSION DATE:  07/25/2019, CONSULTATION DATE:  07/25/2019 REFERRING MD:  Dr. Renaye Rakers, CHIEF COMPLAINT:  Cardiac arrest    Brief History   29 yo male found unresponsive with probable drug overdose.  PEA arrest with ROSC after about 15 minutes.  Found to have lactic acidosis, AKI from rhabdomyolysis, aspiration pneumonia, shock liver, hypothermia.  UDS positive for cocaine, benzo's, THC.       Past Medical History  Substance abuse   Significant Hospital Events   6/09 Admit, start TTM and LTM  Consults:  Nephrology  Procedures:  ETT 6/09 >>  Rt chest tube 6/09 >>  Lt radial a line 6/09 >>  Rt [place] CVL 6/09 >>  Rt femoral HD cath 6/09 >>   Significant Diagnostic Tests:  CT head 6/09 >> no acute findings CT chest 6/09 >> 10% Rt PTX, diffuse tree in bud and nodular opacities, debris within b/l mainstem bronchi, mucous plugging lower lobes CT abd/pelvis 6/09 >> normal Renal u/s 6/10 >> echogenic kidney b/l  Micro Data:  COVID 6/9 > negative Blood culture 6/9 > Urine culture 6/9 >  Antimicrobials:   Zosyn 6/09 >>  Vancomycin 6/09 >>   Interim history/subjective:  Remains on LTM, TTM, pressors.  Objective   Blood pressure 124/69, pulse (!) 107, temperature (!) 96.4 F (35.8 C), resp. rate (!) 24, height 6\' 1"  (1.854 m), weight 87 kg, SpO2 98 %.    Vent Mode: PCV FiO2 (%):  [80 %-100 %] 100 % Set Rate:  [20 bmp-28 bmp] 24 bmp PEEP:  [5 cmH20] 5 cmH20 Plateau Pressure:  [26 cmH20-37 cmH20] 31 cmH20   Intake/Output Summary (Last 24 hours) at 07/26/2019 0705 Last data filed at 07/26/2019 0600 Gross per 24 hour  Intake 4018.76 ml  Output 2660 ml  Net 1358.76 ml   Filed Weights   07/25/19 1945 07/26/19 0500  Weight: 86.9 kg 87 kg    Examination:  General - sedated Eyes - pupils pinpoint ENT - ETT in place Cardiac - regular rate/rhythm, no murmur Chest - scattered rhonchi, Rt chest tube in place Abdomen  - soft, non tender Extremities - no cyanosis, clubbing, or edema Skin - no rashes Neuro - RASS -4   Resolved Hospital Problem list   Hyperkalemia,   Assessment & Plan:   Acute hypoxic respiratory failure from cardiac arrest and aspiration pneumonia. - full vent support - f/u CXR, ABG  Aspiration pneumonia. - day 2 of ABx  Shock from cardiac arrest and sepsis 2nd to pneumonia. - pressors to keep MAP > 65 - check cortisol - f/u Echo  AKI from rhabdomyolysis. Anion gap metabolic acidosis with lactic acidosis. - CRRT   Acute metabolic encephalopathy from anoxia, sepsis, renal failure. - TTM >> rewarming in afternoon of 6/10 - LTM  Elevated LFT's from shock. - f/u LFTs  Best practice:  Diet: tube feeds DVT prophylaxis: Subq heparin  GI prophylaxis: protonix Mobility: Bedrest  Code Status: Full Disposition: ICU   Labs    CMP Latest Ref Rng & Units 07/26/2019 07/26/2019 07/25/2019  Glucose 70 - 99 mg/dL 09/24/2019) 95 90  BUN 6 - 20 mg/dL 263(Z) 85(Y) 85(O)  Creatinine 0.61 - 1.24 mg/dL 27(X) 4.12(I) 7.86(V)  Sodium 135 - 145 mmol/L 144 146(H) 140  Potassium 3.5 - 5.1 mmol/L 3.8 4.9 5.7(H)  Chloride 98 - 111 mmol/L 107 108 100  CO2 22 - 32 mmol/L 25 27 28   Calcium  8.9 - 10.3 mg/dL 6.8(L) 6.6(L) 6.6(L)  Total Protein 6.5 - 8.1 g/dL - - -  Total Bilirubin 0.3 - 1.2 mg/dL - - -  Alkaline Phos 38 - 126 U/L - - -  AST 15 - 41 U/L - - -  ALT 0 - 44 U/L - - -    CBC Latest Ref Rng & Units 07/25/2019 07/25/2019 07/25/2019  WBC 4.0 - 10.5 K/uL - - 19.0(H)  Hemoglobin 13.0 - 17.0 g/dL 19.0(H) 18.7(H) 17.1(H)  Hematocrit 39 - 52 % 56.0(H) 55.0(H) 58.2(H)  Platelets 150 - 400 K/uL - - 262    ABG    Component Value Date/Time   PHART 7.052 (LL) 07/25/2019 1624   PCO2ART 107 (HH) 07/25/2019 1624   PO2ART 83.0 07/25/2019 1624   HCO3 29.3 (H) 07/25/2019 1624   TCO2 19 (L) 07/25/2019 1242   ACIDBASEDEF 0.9 07/25/2019 1624   O2SAT 95.1 07/25/2019 1624    CBG (last 3)  Recent  Labs    07/25/19 2238 07/26/19 0020 07/26/19 0425  GLUCAP 85 90 102*    Critical care time: 39 minutes.  Chesley Mires, MD [place] Pager - [phone] 07/26/2019, 7:31 AM

## 2019-07-26 NOTE — Significant Event (Signed)
George Kelly 07/26/19 1055 am. Called mother [name] and informed her of her sons condition and admission to ICU. She is in route with her daughter.   [name] Valeree Leidy ACNP Acute Kelly Nurse Practitioner [facility] Please consult Amion 07/26/2019, 10:57 AM

## 2019-07-26 NOTE — Progress Notes (Signed)
Initial Nutrition Assessment  DOCUMENTATION CODES:   Not applicable  INTERVENTION:   Tube Feeding:  Vital 1.5 at 55 ml/hr Pro-Stat 30 mL TID Provides 2280 kcals, 134 g of protein and 1003 mL of free water Meets 100% estimated calorie and protein needs   NUTRITION DIAGNOSIS:   Inadequate oral intake related to acute illness as evidenced by NPO status.  GOAL:   Patient will meet greater than or equal to 90% of their needs  MONITOR:   Vent status, Labs, Weight trends, TF tolerance  REASON FOR ASSESSMENT:   Consult, Ventilator Enteral/tube feeding initiation and management  ASSESSMENT:   29 yo male found unresponsive with possible drug OD, admitted post cardiac arrest to ICU on vent with severe shock, AKI with hyperkalemia and acidosis. PMH includes polysubstance abuse. . RD working remotely.  6/09 CRRT initiated  Issues with circuit clotting overnight. Plan to d/c CRRT today  Patient is currently intubated on ventilator support, fentanyl/versed, requiring levophed and vasopressin MV: 17.7  L/min Temp (24hrs), Avg:96.3 F (35.7 C), Min:95 F (35 C), Max:97.9 F (36.6 C)  Propofol: none  OG tube with tip in stomach  Unable to obtain diet and weight history at this time  Labs: reviewed; sodium 146 (H), Creatinine 2.57, BuN 24 Meds: miralax prn  Diet Order:   Diet Order            Diet NPO time specified  Diet effective now                 EDUCATION NEEDS:   Not appropriate for education at this time  Skin:  Skin Assessment: Reviewed RN Assessment  Last BM:  no documented BM  Height:   Ht Readings from Last 1 Encounters:  07/25/19 6\' 1"  (1.854 m)    Weight:   Wt Readings from Last 1 Encounters:  07/26/19 87 kg    BMI:  Body mass index is 25.3 kg/m.  Estimated Nutritional Needs:   Kcal:  2268 kcals  Protein:  130-170 g  Fluid:  >/= 2 L   2269 MS, RDN, LDN, CNSC Registered Dietitian III RD Pager Number and RD On-Call  Pager Number Located in [place]

## 2019-07-26 NOTE — Procedures (Addendum)
Patient Name: George Kelly  MRN: [id number]  Epilepsy Attending: Charlsie Kelly  Referring Physician/Provider: Raymon Mutton, NP Duration: 07/25/2019 1854 to 07/26/2019 1854  Patient history: 29 year old male presented after cardiac arrest,  now on TTM.  EEG to evaluate for seizures.  Level of alertness: Comatose  AEDs during EEG study: Versed  Technical aspects: This EEG study was done with scalp electrodes positioned according to the 10-20 International system of electrode placement. Electrical activity was acquired at a sampling rate of 500Hz  and reviewed with a high frequency filter of 70Hz  and a low frequency filter of 1Hz . EEG data were recorded continuously and digitally stored.   Description: EEG showed continuous generalized 3 to 6 Hz theta-delta slowing. EEG was reactive to noxious stimulation Hyperventilation and photic stimulation were not performed.     ABNORMALITY -Continuous slow, generalized  IMPRESSION: This study is suggestive of severe diffuse encephalopathy, nonspecific etiology. No seizures or epileptiform discharges were seen throughout the recording.  [name] 

## 2019-07-26 NOTE — Progress Notes (Signed)
  Echocardiogram 2D Echocardiogram has been performed.  George Kelly 07/26/2019, 2:34 PM

## 2019-07-26 NOTE — Progress Notes (Signed)
CRITICAL VALUE ALERT  Critical Value:  Lactic Acid 5.4  Date & Time Notied: 07/25/19 2130  Provider Notified: Warrick Parisian, MD  Orders Received/Actions taken: Acknowledged. Awaiting orders.   Barbaraann Cao, RN  07/25/19 2130

## 2019-07-26 NOTE — Progress Notes (Signed)
Renal MD in to see patient this am states that when system clots d/c CRRT. System clotted and increased return pressures increased. MD notified

## 2019-07-26 NOTE — Progress Notes (Signed)
Pharmacy Antibiotic Note  George Kelly is a 29 y.o. male admitted on 07/25/2019 with sepsis, to transition from cefepime to Zosyn to also cover for aspiration pneumonia.  Plan: Zosyn 3.375g IV Q6H.  Height: 6\' 1"  (185.4 cm) Weight: 87 kg (191 lb 12.8 oz) IBW/kg (Calculated) : 79.9  Temp (24hrs), Avg:96.2 F (35.7 C), Min:95 F (35 C), Max:97.2 F (36.2 C)  Recent Labs  Lab 07/25/19 1234 07/25/19 1240 07/25/19 1540 07/25/19 1643 07/25/19 1715 07/25/19 1810 07/25/19 2136 07/26/19 0018 07/26/19 0425  WBC 19.0*  --   --   --   --   --   --   --   --   CREATININE  --    < > QUESTIONABLE RESULTS, RECOMMEND RECOLLECT TO VERIFY  --   --  3.16* 2.90* 2.67* 2.57*  LATICACIDVEN  --   --  4.8*  --  4.6*  --   --   --   --   VANCORANDOM  --   --   --  <4  --   --   --   --   --    < > = values in this interval not displayed.    Estimated Creatinine Clearance: 48.4 mL/min (A) (by C-G formula based on SCr of 2.57 mg/dL (H)).     Thank you for allowing pharmacy to be a part of this patient's care.  09/25/19, PharmD, BCPS  07/26/2019 6:54 AM

## 2019-07-26 NOTE — Progress Notes (Signed)
Patient ID: George Kelly, male   DOB: 06/15/1990, 29 y.o.   MRN: [id number] [facility] Progress Note   Assessment/ Plan:   1.  Acute kidney injury (unknown renal baseline):  Suspected to be hemodynamically mediated following alcohol intoxication and with possible superimposed rhabdomyolysis.    Started emergently on hemodialysis overnight after subsequent labs following efforts at medical management showed persistent hyperkalemia-labs this morning permissive to discontinuation of CRRT and orders provided to nurse.  Nonoliguric overnight with 2.2 L urine output; recommend maintenance IV fluid to be started when CRRT is discontinued. 2.  Hyperkalemia: Secondary to acute kidney injury, unresponsive to medical management following which CRRT was started last night. 3.  Status post cardiac arrest: Status post acute resuscitation, now intubated with ventilator dependent respiratory failure. 4.  Mixed respiratory and metabolic acidosis:  Metabolic panel this morning shows appropriate sodium bicarbonate level with arterial blood gas pending.  Subjective:   Problems with CRRT circuit clotting overnight noted.   Objective:   BP 124/69   Pulse (!) 107   Temp (!) 96.4 F (35.8 C)   Resp (!) 24   Ht '6\' 1"'  (1.854 m)   Wt 87 kg   SpO2 98%   BMI 25.30 kg/m   Intake/Output Summary (Last 24 hours) at 07/26/2019 0719 Last data filed at 07/26/2019 0600 Gross per 24 hour  Intake 4018.76 ml  Output 2660 ml  Net 1358.76 ml   Weight change:   Physical Exam: Gen: Intubated, sedated, appears comfortable CVS: Pulse regular tachycardia, S1 and S2 normal Resp: Anteriorly clear to auscultation, no rales/rhonchi Abd: Soft, flat, nontender Ext: No lower extremity edema.  Right femoral dialysis catheter  Imaging: CT ABDOMEN PELVIS WO CONTRAST  Result Date: 07/25/2019 CLINICAL DATA:  Chest trauma, mod-severe found down, unknown hx, level 2 trauma - please do not wait on Cr; Abdominal trauma  Technologist notes state witnessed cardiac arrest. EXAM: CT CHEST, ABDOMEN AND PELVIS WITHOUT CONTRAST TECHNIQUE: Multidetector CT imaging of the chest, abdomen and pelvis was performed following the standard protocol without IV contrast. COMPARISON:  Chest radiograph earlier this day. FINDINGS: CT CHEST FINDINGS Cardiovascular: The thoracic aorta is normal in caliber. Heart is upper normal in size. No pericardial effusion. There is air in the intravascular structures. Mediastinum/Nodes: Endotracheal tube tip just above the carina. Enteric tube in place with tip in the stomach, however fluid distended esophagus. No evidence of mediastinal hematoma. No obvious adenopathy allowing for lack of IV contrast. Lungs/Pleura: Small anterior right pneumothorax, less than 10%. Diffuse tree in bud and nodular opacities throughout both lungs, pattern typical of aspiration. There is debris within the bilateral mainstem bronchi with diffuse bronchial thickening. Areas of bronchial occlusion and mucous plugging in the lower lobes. No significant pleural effusion. Musculoskeletal: No acute fracture of the ribs, sternum, included clavicles or shoulder girdles. No fracture of the thoracic spine. CT ABDOMEN PELVIS FINDINGS Hepatobiliary: No obvious hepatic injury allowing for lack of IV contrast. There is no perihepatic hematoma. Questionable intraluminal sludge in the gallbladder. No pericholecystic inflammation. Pancreas: No ductal dilatation or inflammation. Spleen: No obvious splenic injury allowing for lack contrast. No perisplenic hematoma. Adrenals/Urinary Tract: No adrenal nodule or hemorrhage. No hydronephrosis or evidence of renal injury. Kidneys are unremarkable. The urinary bladder is decompressed by Foley catheter. No perivesicular fluid to suggest injury. Stomach/Bowel: Enteric tube tip in the stomach, intraluminal fluid dependently. No gastric thickening. Normal positioning of the ligament of Treitz. No obvious bowel  injury or inflammation. No obstruction. Normal  appendix. No visualized mesenteric hematoma. Vascular/Lymphatic: No retroperitoneal fluid. Normal caliber abdominal aorta. No bulky adenopathy. Reproductive: Prostate is unremarkable. Other: No free fluid or free air. Musculoskeletal: No fracture of the pelvis or lumbar spine. Hemi transitional lumbosacral anatomy. IMPRESSION: 1. Small anterior right pneumothorax, less than 10%. 2. Diffuse tree in bud and nodular opacities throughout both lungs, pattern typical of aspiration. There is debris within the bilateral mainstem bronchi with diffuse bronchial thickening. Areas of bronchial occlusion and mucous plugging in the lower lobes. 3. No evidence of acute traumatic injury to the abdomen or pelvis allowing for lack of IV contrast. 4. Endotracheal tube tip just above the carina. Enteric tube tip in the stomach, however fluid distended esophagus. Critical Value/emergent results were called by telephone at the time of interpretation on 07/25/2019 at 2:31 pm to Dr Octaviano Glow , who verbally acknowledged these results. Electronically Signed   By: Keith Rake M.D.   On: 07/25/2019 14:31   CT Head Wo Contrast  Result Date: 07/25/2019 CLINICAL DATA:  Witnessed cardiac arrest EXAM: CT HEAD WITHOUT CONTRAST CT CERVICAL SPINE WITHOUT CONTRAST TECHNIQUE: Multidetector CT imaging of the head and cervical spine was performed following the standard protocol without intravenous contrast. Multiplanar CT image reconstructions of the cervical spine were also generated. COMPARISON:  None. FINDINGS: CT HEAD FINDINGS Brain: No evidence of acute infarction, hemorrhage, hydrocephalus, extra-axial collection or mass lesion/mass effect. Vascular: No hyperdense vessel or unexpected calcification. Skull: Normal. Negative for fracture or focal lesion. Sinuses/Orbits: Air-fluid levels are noted within the sphenoid sinus. Increased mucus is noted in the posterior nasopharynx. Other: None CT  CERVICAL SPINE FINDINGS Alignment: Within normal limits. Skull base and vertebrae: 7 cervical segments are well visualized. Vertebral body height is well maintained. No acute fracture or acute facet abnormality is noted. Endotracheal tube and gastric catheter are noted. The odontoid is within normal limits. Soft tissues and spinal canal: Surrounding soft tissue structures are within normal limits without focal hematoma. Upper chest: There is air within the left innominate vein as well as the right jugular vein likely related to recent IV start. Visualized lung fields demonstrate patchy airspace opacity bilaterally slightly greater on the right than the left this would be better evaluated on upcoming CT of the chest. Other: None IMPRESSION: CT of the head: No acute intracranial abnormality is noted. Air-fluid levels in the sphenoid sinus. CT of cervical spine: No acute bony abnormality is noted. Patchy airspace opacities bilaterally which will be better evaluated on CT of the chest. Electronically Signed   By: Inez Catalina M.D.   On: 07/25/2019 14:26   CT CHEST WO CONTRAST  Result Date: 07/25/2019 CLINICAL DATA:  Chest trauma, mod-severe found down, unknown hx, level 2 trauma - please do not wait on Cr; Abdominal trauma Technologist notes state witnessed cardiac arrest. EXAM: CT CHEST, ABDOMEN AND PELVIS WITHOUT CONTRAST TECHNIQUE: Multidetector CT imaging of the chest, abdomen and pelvis was performed following the standard protocol without IV contrast. COMPARISON:  Chest radiograph earlier this day. FINDINGS: CT CHEST FINDINGS Cardiovascular: The thoracic aorta is normal in caliber. Heart is upper normal in size. No pericardial effusion. There is air in the intravascular structures. Mediastinum/Nodes: Endotracheal tube tip just above the carina. Enteric tube in place with tip in the stomach, however fluid distended esophagus. No evidence of mediastinal hematoma. No obvious adenopathy allowing for lack of IV  contrast. Lungs/Pleura: Small anterior right pneumothorax, less than 10%. Diffuse tree in bud and nodular opacities throughout both lungs, pattern  typical of aspiration. There is debris within the bilateral mainstem bronchi with diffuse bronchial thickening. Areas of bronchial occlusion and mucous plugging in the lower lobes. No significant pleural effusion. Musculoskeletal: No acute fracture of the ribs, sternum, included clavicles or shoulder girdles. No fracture of the thoracic spine. CT ABDOMEN PELVIS FINDINGS Hepatobiliary: No obvious hepatic injury allowing for lack of IV contrast. There is no perihepatic hematoma. Questionable intraluminal sludge in the gallbladder. No pericholecystic inflammation. Pancreas: No ductal dilatation or inflammation. Spleen: No obvious splenic injury allowing for lack contrast. No perisplenic hematoma. Adrenals/Urinary Tract: No adrenal nodule or hemorrhage. No hydronephrosis or evidence of renal injury. Kidneys are unremarkable. The urinary bladder is decompressed by Foley catheter. No perivesicular fluid to suggest injury. Stomach/Bowel: Enteric tube tip in the stomach, intraluminal fluid dependently. No gastric thickening. Normal positioning of the ligament of Treitz. No obvious bowel injury or inflammation. No obstruction. Normal appendix. No visualized mesenteric hematoma. Vascular/Lymphatic: No retroperitoneal fluid. Normal caliber abdominal aorta. No bulky adenopathy. Reproductive: Prostate is unremarkable. Other: No free fluid or free air. Musculoskeletal: No fracture of the pelvis or lumbar spine. Hemi transitional lumbosacral anatomy. IMPRESSION: 1. Small anterior right pneumothorax, less than 10%. 2. Diffuse tree in bud and nodular opacities throughout both lungs, pattern typical of aspiration. There is debris within the bilateral mainstem bronchi with diffuse bronchial thickening. Areas of bronchial occlusion and mucous plugging in the lower lobes. 3. No evidence of  acute traumatic injury to the abdomen or pelvis allowing for lack of IV contrast. 4. Endotracheal tube tip just above the carina. Enteric tube tip in the stomach, however fluid distended esophagus. Critical Value/emergent results were called by telephone at the time of interpretation on 07/25/2019 at 2:31 pm to Dr Octaviano Glow , who verbally acknowledged these results. Electronically Signed   By: Keith Rake M.D.   On: 07/25/2019 14:31   CT Cervical Spine Wo Contrast  Result Date: 07/25/2019 CLINICAL DATA:  Witnessed cardiac arrest EXAM: CT HEAD WITHOUT CONTRAST CT CERVICAL SPINE WITHOUT CONTRAST TECHNIQUE: Multidetector CT imaging of the head and cervical spine was performed following the standard protocol without intravenous contrast. Multiplanar CT image reconstructions of the cervical spine were also generated. COMPARISON:  None. FINDINGS: CT HEAD FINDINGS Brain: No evidence of acute infarction, hemorrhage, hydrocephalus, extra-axial collection or mass lesion/mass effect. Vascular: No hyperdense vessel or unexpected calcification. Skull: Normal. Negative for fracture or focal lesion. Sinuses/Orbits: Air-fluid levels are noted within the sphenoid sinus. Increased mucus is noted in the posterior nasopharynx. Other: None CT CERVICAL SPINE FINDINGS Alignment: Within normal limits. Skull base and vertebrae: 7 cervical segments are well visualized. Vertebral body height is well maintained. No acute fracture or acute facet abnormality is noted. Endotracheal tube and gastric catheter are noted. The odontoid is within normal limits. Soft tissues and spinal canal: Surrounding soft tissue structures are within normal limits without focal hematoma. Upper chest: There is air within the left innominate vein as well as the right jugular vein likely related to recent IV start. Visualized lung fields demonstrate patchy airspace opacity bilaterally slightly greater on the right than the left this would be better evaluated  on upcoming CT of the chest. Other: None IMPRESSION: CT of the head: No acute intracranial abnormality is noted. Air-fluid levels in the sphenoid sinus. CT of cervical spine: No acute bony abnormality is noted. Patchy airspace opacities bilaterally which will be better evaluated on CT of the chest. Electronically Signed   By: Linus Mako.D.  On: 07/25/2019 14:26   US RENAL  Result Date: 07/26/2019 CLINICAL DATA:  Acute renal failure EXAM: RENAL / URINARY TRACT ULTRASOUND COMPLETE COMPARISON:  CT dated July 25, 2019 FINDINGS: Right Kidney: Renal measurements: 9.5 x 3.8 x 6 cm = volume: 76 mL. There is increased cortical echogenicity without evidence for hydronephrosis. Left Kidney: Renal measurements: 11.6 x 4.7 x 4.17 = volume: 117 mL. There is increased cortical echogenicity without evidence for hydronephrosis. Bladder: Bladder is decompressed with a Foley catheter and therefore is poorly evaluated Other: Incidentally noted is diffuse gallbladder wall thickening. IMPRESSION: 1. Echogenic kidneys bilaterally which can be seen in patients with medical renal disease. 2. No hydronephrosis. 3. Incidentally noted is gallbladder wall thickening. This is a nonspecific finding may be secondary to the patient's volume status. If there is clinical concern for acute cholecystitis, follow-up with a dedicated right upper quadrant ultrasound is recommended. Electronically Signed   By: Constance Holster M.D.   On: 07/26/2019 03:05   DG CHEST PORT 1 VIEW  Result Date: 07/25/2019 CLINICAL DATA:  Central line placement. Right chest tube placement. EXAM: PORTABLE CHEST 1 VIEW COMPARISON:  CT and radiograph earlier this day. FINDINGS: Right subclavian central line tip in the lower SVC. New right chest tube with tip directed towards the apex. No visualized pneumothorax. Small amount subcutaneous emphysema in the right chest wall. Endotracheal tube tip remains at the clavicular heads. Enteric tube tip below the diaphragm in  the stomach. Patchy reticulonodular opacities throughout both lungs with seen on CT. No significant pleural effusion. Unchanged heart size and mediastinal contours. IMPRESSION: 1. New right chest tube with tip directed towards the apex. No visualized pneumothorax. 2. Right subclavian central line tip in the lower SVC. 3. Patchy reticulonodular opacities throughout both lungs, progressed from radiographs earlier today. Electronically Signed   By: Keith Rake M.D.   On: 07/25/2019 16:25   DG Chest Portable 1 View  Result Date: 07/25/2019 CLINICAL DATA:  ETT placement. EXAM: PORTABLE CHEST 1 VIEW COMPARISON:  Single-view of the chest earlier today. FINDINGS: Endotracheal tube is in place with the tip in good position just below the clavicular heads. Additional tube overlying the base of the neck seen on the prior examination is no longer present. Lungs clear. Heart size normal. Defibrillator pad in place. No pneumothorax or pleural effusion. IMPRESSION: ETT in good position. Lungs clear. Electronically Signed   By: Inge Rise M.D.   On: 07/25/2019 13:47   DG Chest Portable 1 View  Result Date: 07/25/2019 CLINICAL DATA:  Status post intubation. EXAM: PORTABLE CHEST 1 VIEW COMPARISON:  None. FINDINGS: Endotracheal tube is in place with the tip in good position at the level the clavicular heads. A second tube is identified projecting over the upper neck. The tube is looped. Defibrillator pad also noted. Lungs clear. No pneumothorax or pleural effusion. Heart size normal. No acute or focal bony abnormality. IMPRESSION: Endotracheal tube in good position. Looped tube in the upper neck could be a malpositioned NG tube. Lungs clear. Electronically Signed   By: Inge Rise M.D.   On: 07/25/2019 13:02   DG Abd Portable 1V  Result Date: 07/25/2019 CLINICAL DATA:  Orogastric tube placement EXAM: PORTABLE ABDOMEN - 1 VIEW COMPARISON:  None. FINDINGS: Tip and side port of the orogastric tube project over  the stomach. There are hazy bibasilar opacities. Right-sided chest tube tip is near the right lung apex. IMPRESSION: Orogastric tube tip and side port project over the stomach. Electronically Signed  By: Ulyses Jarred M.D.   On: 07/25/2019 22:38    Labs: BMP Latest Ref Rng & Units 07/26/2019 07/26/2019 07/25/2019  Glucose 70 - 99 mg/dL 100(H) 95 90  BUN 6 - 20 mg/dL 24(H) 23(H) 22(H)  Creatinine 0.61 - 1.24 mg/dL 2.57(H) 2.67(H) 2.90(H)  Sodium 135 - 145 mmol/L 144 146(H) 140  Potassium 3.5 - 5.1 mmol/L 3.8 4.9 5.7(H)  Chloride 98 - 111 mmol/L 107 108 100  CO2 22 - 32 mmol/L '25 27 28  ' Calcium 8.9 - 10.3 mg/dL 6.8(L) 6.6(L) 6.6(L)    CBC Recent Labs  Lab 07/25/19 1234 07/25/19 1240 07/25/19 1242  WBC 19.0*  --   --   HGB 17.1* 18.7* 19.0*  HCT 58.2* 55.0* 56.0*  MCV 99.8  --   --   PLT 262  --   --     Medications:    . alteplase  2 mg Intracatheter Once  . alteplase  2 mg Intracatheter Once  . chlorhexidine gluconate (MEDLINE KIT)  15 mL Mouth Rinse BID  . Chlorhexidine Gluconate Cloth  6 each Topical Daily  . dextrose  50 mL Intravenous Once  . fentaNYL (SUBLIMAZE) injection  50 mcg Intravenous Once  . heparin  5,000 Units Subcutaneous Q8H  . mouth rinse  15 mL Mouth Rinse 10 times per day  . mupirocin ointment  1 application Nasal BID  . pantoprazole (PROTONIX) IV  40 mg Intravenous QHS   Elmarie Shiley, MD 07/26/2019, 7:19 AM

## 2019-07-26 NOTE — Progress Notes (Signed)
eLink Physician-Brief Progress Note Patient Name: George Kelly DOB: 06/15/1990 MRN: [id number]   Date of Service  07/26/2019  HPI/Events of Note  Corrected Calcium  7.5 mg / dl, Mg++ 1.3, lactic acid 5.4  eICU Interventions  Calcium gluconate 2 gm iv x 1,  Magnesium correction per electrolyte protocol, Albumin 500 ml iv x 1 for lactic acidosis.        Thomasene Lot Fatina Sprankle 07/26/2019, 11:43 PM

## 2019-07-26 NOTE — Progress Notes (Signed)
Pharmacy Antibiotic Note  George Kelly is a 29 y.o. male admitted on 07/25/2019 with sepsis on vancomycin, transitioned from cefepime to Zosyn to also cover for aspiration pneumonia. CRRT started 6/9 PM, stopped 6/10 AM.  Vancomycin random this this AM is 9 after vancomycin 1500mg  IV loading dose 6/9 PM.  Plan: Adjust Zosyn to 2.25g IV q8h now that off CRRT Re-dose vancomycin 1g IV x 1 - f/u SCr trend off CRRT to assess clearance for further doses Monitor clinical progress, c/s F/u de-escalation plan/LOT, Nephrology plans, vancomycin levels as indicated   Height: 6\' 1"  (185.4 cm) Weight: 87 kg (191 lb 12.8 oz) IBW/kg (Calculated) : 79.9  Temp (24hrs), Avg:96.3 F (35.7 C), Min:95 F (35 C), Max:97.9 F (36.6 C)  Recent Labs  Lab 07/25/19 1234 07/25/19 1240 07/25/19 1540 07/25/19 1643 07/25/19 1715 07/25/19 1810 07/25/19 2136 07/26/19 0018 07/26/19 0425 07/26/19 0905  WBC 19.0*  --   --   --   --   --   --   --   --   --   CREATININE  --    < > QUESTIONABLE RESULTS, RECOMMEND RECOLLECT TO VERIFY  --   --  3.16* 2.90* 2.67* 2.57*  --   LATICACIDVEN  --   --  4.8*  --  4.6*  --   --   --   --   --   VANCORANDOM  --   --   --  <4  --   --   --   --   --  9   < > = values in this interval not displayed.    Estimated Creatinine Clearance: 48.4 mL/min (A) (by C-G formula based on SCr of 2.57 mg/dL (H)).     09/25/19, PharmD, BCPS Please check AMION for all [facility] contact numbers Clinical Pharmacist 07/26/2019 10:23 AM

## 2019-07-27 ENCOUNTER — Inpatient Hospital Stay (HOSPITAL_COMMUNITY): Payer: Self-pay

## 2019-07-27 LAB — GLUCOSE, CAPILLARY
Glucose-Capillary: 106 mg/dL — ABNORMAL HIGH (ref 70–99)
Glucose-Capillary: 107 mg/dL — ABNORMAL HIGH (ref 70–99)
Glucose-Capillary: 141 mg/dL — ABNORMAL HIGH (ref 70–99)
Glucose-Capillary: 65 mg/dL — ABNORMAL LOW (ref 70–99)
Glucose-Capillary: 69 mg/dL — ABNORMAL LOW (ref 70–99)
Glucose-Capillary: 79 mg/dL (ref 70–99)
Glucose-Capillary: 83 mg/dL (ref 70–99)
Glucose-Capillary: 93 mg/dL (ref 70–99)

## 2019-07-27 LAB — POCT I-STAT 7, (LYTES, BLD GAS, ICA,H+H)
Acid-Base Excess: 6 mmol/L — ABNORMAL HIGH (ref 0.0–2.0)
Acid-Base Excess: 8 mmol/L — ABNORMAL HIGH (ref 0.0–2.0)
Acid-Base Excess: 8 mmol/L — ABNORMAL HIGH (ref 0.0–2.0)
Bicarbonate: 32 mmol/L — ABNORMAL HIGH (ref 20.0–28.0)
Bicarbonate: 36.3 mmol/L — ABNORMAL HIGH (ref 20.0–28.0)
Bicarbonate: 33.2 mmol/L — ABNORMAL HIGH (ref 20.0–28.0)
Calcium, Ion: 1 mmol/L — ABNORMAL LOW (ref 1.15–1.40)
Calcium, Ion: 1.09 mmol/L — ABNORMAL LOW (ref 1.15–1.40)
Calcium, Ion: 1.04 mmol/L — ABNORMAL LOW (ref 1.15–1.40)
HCT: 39 % (ref 39.0–52.0)
HCT: 40 % (ref 39.0–52.0)
HCT: 38 % — ABNORMAL LOW (ref 39.0–52.0)
Hemoglobin: 13.3 g/dL (ref 13.0–17.0)
Hemoglobin: 13.6 g/dL (ref 13.0–17.0)
Hemoglobin: 12.9 g/dL — ABNORMAL LOW (ref 13.0–17.0)
O2 Saturation: 84 %
O2 Saturation: 90 %
O2 Saturation: 96 %
Patient temperature: 98.7
Patient temperature: 99.1
Potassium: 3.7 mmol/L (ref 3.5–5.1)
Potassium: 4.1 mmol/L (ref 3.5–5.1)
Potassium: 3.8 mmol/L (ref 3.5–5.1)
Sodium: 143 mmol/L (ref 135–145)
Sodium: 143 mmol/L (ref 135–145)
Sodium: 141 mmol/L (ref 135–145)
TCO2: 33 mmol/L — ABNORMAL HIGH (ref 22–32)
TCO2: 38 mmol/L — ABNORMAL HIGH (ref 22–32)
TCO2: 35 mmol/L — ABNORMAL HIGH (ref 22–32)
pCO2 arterial: 49.6 mmHg — ABNORMAL HIGH (ref 32.0–48.0)
pCO2 arterial: 65.5 mmHg (ref 32.0–48.0)
pCO2 arterial: 48.6 mmHg — ABNORMAL HIGH (ref 32.0–48.0)
pH, Arterial: 7.417 (ref 7.350–7.450)
pH, Arterial: 7.352 (ref 7.350–7.450)
pH, Arterial: 7.443 (ref 7.350–7.450)
pO2, Arterial: 48 mmHg — ABNORMAL LOW (ref 83.0–108.0)
pO2, Arterial: 63 mmHg — ABNORMAL LOW (ref 83.0–108.0)
pO2, Arterial: 79 mmHg — ABNORMAL LOW (ref 83.0–108.0)

## 2019-07-27 LAB — COMPREHENSIVE METABOLIC PANEL
ALT: 483 U/L — ABNORMAL HIGH (ref 0–44)
AST: 675 U/L — ABNORMAL HIGH (ref 15–41)
Albumin: 2.4 g/dL — ABNORMAL LOW (ref 3.5–5.0)
Alkaline Phosphatase: 49 U/L (ref 38–126)
Anion gap: 12 (ref 5–15)
BUN: 31 mg/dL — ABNORMAL HIGH (ref 6–20)
CO2: 31 mmol/L (ref 22–32)
Calcium: 7.3 mg/dL — ABNORMAL LOW (ref 8.9–10.3)
Chloride: 101 mmol/L (ref 98–111)
Creatinine, Ser: 2.76 mg/dL — ABNORMAL HIGH (ref 0.61–1.24)
GFR calc Af Amer: 35 mL/min — ABNORMAL LOW (ref >60)
GFR calc non Af Amer: 30 mL/min — ABNORMAL LOW (ref >60)
Glucose, Bld: 94 mg/dL (ref 70–99)
Potassium: 3.9 mmol/L (ref 3.5–5.1)
Sodium: 144 mmol/L (ref 135–145)
Total Bilirubin: 4.2 mg/dL — ABNORMAL HIGH (ref 0.3–1.2)
Total Protein: 5 g/dL — ABNORMAL LOW (ref 6.5–8.1)

## 2019-07-27 LAB — HEPATIC FUNCTION PANEL
ALT: 542 U/L — ABNORMAL HIGH (ref 0–44)
AST: 760 U/L — ABNORMAL HIGH (ref 15–41)
Albumin: 2.4 g/dL — ABNORMAL LOW (ref 3.5–5.0)
Alkaline Phosphatase: 48 U/L (ref 38–126)
Bilirubin, Direct: 1.2 mg/dL — ABNORMAL HIGH (ref 0.0–0.2)
Indirect Bilirubin: 2.9 mg/dL — ABNORMAL HIGH (ref 0.3–0.9)
Total Bilirubin: 4.1 mg/dL — ABNORMAL HIGH (ref 0.3–1.2)
Total Protein: 4.8 g/dL — ABNORMAL LOW (ref 6.5–8.1)

## 2019-07-27 LAB — CBC
HCT: 40.9 % (ref 39.0–52.0)
Hemoglobin: 13.6 g/dL (ref 13.0–17.0)
MCH: 28.9 pg (ref 26.0–34.0)
MCHC: 33.3 g/dL (ref 30.0–36.0)
MCV: 86.8 fL (ref 80.0–100.0)
Platelets: UNDETERMINED 10*3/uL (ref 150–400)
RBC: 4.71 MIL/uL (ref 4.22–5.81)
RDW: 15.5 % (ref 11.5–15.5)
WBC: 10.6 10*3/uL — ABNORMAL HIGH (ref 4.0–10.5)
nRBC: 1.7 % — ABNORMAL HIGH (ref 0.0–0.2)

## 2019-07-27 LAB — MAGNESIUM: Magnesium: 2.6 mg/dL — ABNORMAL HIGH (ref 1.7–2.4)

## 2019-07-27 LAB — CK: Total CK: 16632 U/L — ABNORMAL HIGH (ref 49–397)

## 2019-07-27 LAB — LACTIC ACID, PLASMA: Lactic Acid, Venous: 4.3 mmol/L (ref 0.5–1.9)

## 2019-07-27 MED ORDER — VITAL 1.5 CAL PO LIQD
1000.0000 mL | ORAL | Status: DC
  Administered 2019-07-27 – 2019-07-31 (×4): 1000 mL
  Filled 2019-07-27 (×6): qty 1000

## 2019-07-27 MED ORDER — AMIODARONE HCL IN DEXTROSE 360-4.14 MG/200ML-% IV SOLN
60.0000 mg/h | INTRAVENOUS | Status: DC
  Administered 2019-07-27 (×2): 60 mg/h via INTRAVENOUS
  Filled 2019-07-27: qty 200

## 2019-07-27 MED ORDER — AMIODARONE HCL IN DEXTROSE 360-4.14 MG/200ML-% IV SOLN
30.0000 mg/h | INTRAVENOUS | Status: DC
  Administered 2019-07-28 – 2019-07-30 (×6): 30 mg/h via INTRAVENOUS
  Filled 2019-07-27 (×5): qty 200

## 2019-07-27 MED ORDER — PANTOPRAZOLE SODIUM 40 MG IV SOLR
40.0000 mg | Freq: Two times a day (BID) | INTRAVENOUS | Status: DC
  Administered 2019-07-27 (×2): 40 mg via INTRAVENOUS
  Filled 2019-07-27 (×2): qty 40

## 2019-07-27 MED ORDER — DEXTROSE 50 % IV SOLN: 12.5000 g | INTRAVENOUS | Status: AC

## 2019-07-27 MED ORDER — LINEZOLID 600 MG/300ML IV SOLN
600.0000 mg | Freq: Two times a day (BID) | INTRAVENOUS | Status: DC
  Administered 2019-07-27 (×2): 600 mg via INTRAVENOUS
  Filled 2019-07-27 (×3): qty 300

## 2019-07-27 MED ORDER — DEXTROSE 50 % IV SOLN
INTRAVENOUS | Status: AC
  Administered 2019-07-27: 12.5 g via INTRAVENOUS
  Filled 2019-07-27: qty 50

## 2019-07-27 MED ORDER — ACETAMINOPHEN 325 MG PO TABS
650.0000 mg | ORAL_TABLET | Freq: Once | ORAL | Status: AC
  Administered 2019-07-27: 650 mg via ORAL
  Filled 2019-07-27: qty 2

## 2019-07-27 MED ORDER — DEXTROSE 10 % IV SOLN: INTRAVENOUS | Status: DC

## 2019-07-27 MED ORDER — AMIODARONE LOAD VIA INFUSION
150.0000 mg | Freq: Once | INTRAVENOUS | Status: AC
  Administered 2019-07-27: 150 mg via INTRAVENOUS
  Filled 2019-07-27: qty 83.34

## 2019-07-27 MED ORDER — AMIODARONE HCL IN DEXTROSE 360-4.14 MG/200ML-% IV SOLN: 60.0000 mg/h | INTRAVENOUS | Status: DC

## 2019-07-27 MED ORDER — AMIODARONE HCL IN DEXTROSE 360-4.14 MG/200ML-% IV SOLN
INTRAVENOUS | Status: AC
  Administered 2019-07-27: 150 mg via INTRAVENOUS
  Filled 2019-07-27: qty 200

## 2019-07-27 MED ORDER — MEPERIDINE HCL 25 MG/ML IJ SOLN
25.0000 mg | Freq: Once | INTRAMUSCULAR | Status: AC
  Administered 2019-07-27: 25 mg via INTRAVENOUS
  Filled 2019-07-27: qty 1

## 2019-07-27 MED ORDER — AMIODARONE LOAD VIA INFUSION: 150.0000 mg | Freq: Once | INTRAVENOUS | Status: DC

## 2019-07-27 MED ORDER — AMIODARONE HCL IN DEXTROSE 360-4.14 MG/200ML-% IV SOLN: 30.0000 mg/h | INTRAVENOUS | Status: DC

## 2019-07-27 NOTE — Progress Notes (Signed)
NAME:  George Kelly, MRN:  [id number], DOB:  06/15/1990, LOS: 2 ADMISSION DATE:  07/25/2019, CONSULTATION DATE:  07/25/2019 REFERRING MD:  Dr. Renaye Rakers, CHIEF COMPLAINT:  Cardiac arrest    Brief History   29 yo male found unresponsive with probable drug overdose.  PEA arrest with ROSC after about 15 minutes.  Found to have lactic acidosis, AKI from rhabdomyolysis, aspiration pneumonia, shock liver, hypothermia.  UDS positive for cocaine, benzo's, THC.       Past Medical History  Substance abuse   Significant Hospital Events   6/09 Admit, start TTM and LTM  Consults:  Nephrology  Procedures:  ETT 6/09 >>  Rt chest tube 6/09 >>  Lt radial a line 6/09 >>  Rt [place] CVL 6/09 >>  Rt femoral HD cath 6/09 >>   Significant Diagnostic Tests:  CT head 6/09 >> no acute findings CT chest 6/09 >> 10% Rt PTX, diffuse tree in bud and nodular opacities, debris within b/l mainstem bronchi, mucous plugging lower lobes CT abd/pelvis 6/09 >> normal Renal u/s 6/10 >> echogenic kidney b/l 6/10 echo: LVEF 55-60%, grade 2 diastolic dysfunction  Micro Data:  COVID 6/9 > negative Blood culture 6/9 >ngtd Urine culture 6/9 >ngtd  Antimicrobials:   Zosyn 6/09 >>  Vancomycin 6/09 >>   Interim history/subjective:  6/11: off crrt since 0900 6/10 and tolerating. Off pressors as of 0430 this am. Not following commands but purposeful and withdraws, rn reports sitting up in bed periodically, will place restraints. abg with hypoxia on 80% fio2 at this time and 5 peep with ptx and ct in place (air leak present) will keep minimal despite oxygen (awaiting cxr this am). hypoglcyemic overnight warranting. d10 with emesis and unable to start tf. Lastly, cont with bloody og contents 6/10:Remains on LTM, TTM, pressors.  Objective   Blood pressure 118/68, pulse (!) 119, temperature 98.6 F (37 C), resp. rate (!) 24, height 6\' 1"  (1.854 m), weight 87 kg, SpO2 98 %.    Vent Mode: PCV FiO2 (%):  [60 %-90 %] 80 % Set Rate:   [24 bmp] 24 bmp PEEP:  [5 cmH20] 5 cmH20 Plateau Pressure:  [27 cmH20-36 cmH20] 30 cmH20   Intake/Output Summary (Last 24 hours) at 07/27/2019 0527 Last data filed at 07/27/2019 0400 Gross per 24 hour  Intake 3009.27 ml  Output 2188 ml  Net 821.27 ml   Filed Weights   07/25/19 1945 07/26/19 0500  Weight: 86.9 kg 87 kg    Examination:  General - sedated, purposeful, in nad on sedation on vent Eyes - pupils pinpoint but reactive, sclera injected but anicteric ENT - ETT in place, mmmp Cardiac - regular  Rhythm/tachy, no murmur Chest - scattered rhonchi, Rt chest tube in place Abdomen - soft, non tender bs + Extremities - no cyanosis, clubbing, or edema, cooling pads in place Skin - no rashes Neuro - RASS -3   Resolved Hospital Problem list   Hyperkalemia,   Assessment & Plan:   Acute hypoxic respiratory failure from cardiac arrest and aspiration pneumonia. - full vent support -sat/sbt when able -minimize peep with ptx - f/u CXR daily OOHCA:  -s/p rosc HFpEF:  -suspected new finding Prolonged QT:  -will recheck ekg today -avoid prolonging agents -goal mag >2 and K >4  Aspiration pneumonia. - day 2 of ABx R ptx:  -s/p chest tube -awaiting cxr this am -maintain -20cmh2o for now, still with airleak.   Shock from cardiac arrest and sepsis 2nd to pneumonia. -  off as of this am, cont to follow -cortisol 59.9 - HFpEF, no systolic dysfunction noted  AKI from rhabdomyolysis. Anion gap metabolic acidosis with lactic acidosis. - CRRT stopped at 0900 6/10 -recheck lactate -pH improved on abg -can stop bicarb infusion -trend CK -FENa 1.36.. most likely atn 2/2 rhabdo  Acute metabolic encephalopathy from anoxia, sepsis, renal failure. - TTM >> normothermic arm at this time - LTM ongoing Polysubstance abuse:  -uds positive for THC, cocaine and benzo  Elevated LFT's from shock. - f/u LFTs, improving  Hypoglycemia:  -monitor closely  -d10  For now -not on tf  2/2 emesis without. Have d/w RN to give zofran for improvement of emesis and then hopefully can start tf and wean off d10  Hypomagnesemia:  -replaced  Bloody GI contents:  -change protonix to BID -follow am hgb Emesis:  -zofran prn, will give now to see if improves so we can start tf.   Best practice:  Diet: tube feeds once emesis controlled DVT prophylaxis: Subq heparin  GI prophylaxis: protonix Mobility: Bedrest  Code Status: Full Disposition: ICU   Labs    CMP Latest Ref Rng & Units 07/27/2019 07/26/2019 07/26/2019  Glucose 70 - 99 mg/dL - 83 -  BUN 6 - 20 mg/dL - 28(H) -  Creatinine 0.61 - 1.24 mg/dL - 2.72(H) -  Sodium 135 - 145 mmol/L 143 144 146(H)  Potassium 3.5 - 5.1 mmol/L 3.7 4.3 4.1  Chloride 98 - 111 mmol/L - 107 -  CO2 22 - 32 mmol/L - 24 -  Calcium 8.9 - 10.3 mg/dL - 6.0(LL) -  Total Protein 6.5 - 8.1 g/dL - 4.4(L) -  Total Bilirubin 0.3 - 1.2 mg/dL - 3.7(H) -  Alkaline Phos 38 - 126 U/L - 54 -  AST 15 - 41 U/L - 912(H) -  ALT 0 - 44 U/L - 610(H) -    CBC Latest Ref Rng & Units 07/27/2019 07/26/2019 07/25/2019  WBC 4.0 - 10.5 K/uL - - -  Hemoglobin 13.0 - 17.0 g/dL 13.3 18.0(H) 19.0(H)  Hematocrit 39 - 52 % 39.0 53.0(H) 56.0(H)  Platelets 150 - 400 K/uL - - -    ABG    Component Value Date/Time   PHART 7.417 07/27/2019 0412   PCO2ART 49.6 (H) 07/27/2019 0412   PO2ART 48 (L) 07/27/2019 0412   HCO3 32.0 (H) 07/27/2019 0412   TCO2 33 (H) 07/27/2019 0412   ACIDBASEDEF 6.0 (H) 07/26/2019 1121   O2SAT 84.0 07/27/2019 0412    CBG (last 3)  Recent Labs    07/27/19 0347 07/27/19 0350 07/27/19 0417  GLUCAP 19* 65* 106*    Critical care time: The patient is critically ill with multiple organ systems failure and requires high complexity decision making for assessment and support, frequent evaluation and titration of therapies, application of advanced monitoring technologies and extensive interpretation of multiple databases.  Critical care time 33 mins.  This represents my time independent of the NPs time taking care of the pt. This is excluding procedures.    Audria Nine DO [facility] 07/27/2019, 5:27 AM

## 2019-07-27 NOTE — Progress Notes (Signed)
Looks like afib RVR HD borderline Start amio  Myrla Halsted MD [facility]

## 2019-07-27 NOTE — Progress Notes (Signed)
eLink Physician-Brief Progress Note Patient Name: George Kelly DOB: 06/15/1990 MRN: [id number]   Date of Service  07/27/2019  HPI/Events of Note  Hypoglycemia  eICU Interventions  D 10 W started at 75 ml / hour        Migdalia Dk 07/27/2019, 4:24 AM

## 2019-07-27 NOTE — Progress Notes (Signed)
LTM maint complete - no skin breakdown under: GND,F4,FP2

## 2019-07-27 NOTE — Progress Notes (Signed)
vLTM EEG complete, No skin breakdwon

## 2019-07-27 NOTE — Procedures (Addendum)
Patient Name:Zaven Fredericks VPC:[id number] Epilepsy Attending:Chanell Nadeau Annabelle Harman Referring Physician/Provider:Whitney Earlene Plater, NP Duration:07/26/2019 [phone] to 07/27/2019 9311  Patient history:29 year old male presented after cardiac arrest, now on TTM. EEG to evaluate for seizures.  Level of alertness:Comatose  AEDs during EEG study:None  Technical aspects: This EEG study was done with scalp electrodes positioned according to the 10-20 International system of electrode placement. Electrical activity was acquired at a sampling rate of 500Hz  and reviewed with a high frequency filter of 70Hz  and a low frequency filter of 1Hz . EEG data were recorded continuously and digitally stored.   Description: EEG showed continuous generalized low amplitude 2 to 3 Hz delta slowing.Hyperventilation and photic stimulation were not performed.   EEG was technically difficult due to significant electrode and myogenic artifact.  ABNORMALITY -Continuousslow, generalized  IMPRESSION: This technically difficult study is suggestive of severe diffuse encephalopathy, nonspecific etiology.No seizures or epileptiform discharges were seen throughout the recording.  Miya Luviano 

## 2019-07-27 NOTE — Progress Notes (Signed)
eLink Physician-Brief Progress Note Patient Name: George Kelly DOB: 06/15/1990 MRN: [id number]   Date of Service  07/27/2019  HPI/Events of Note  Patient is shivering while on TTM, narrow complex tachycardia of unclear etiology, Pt is hemodynamically stable.  eICU Interventions  Demerol 25 mg iv x 1 for shivering, 12 Lead EKG ordered.        Thomasene Lot [name] 07/27/2019, 9:25 PM

## 2019-07-27 NOTE — Progress Notes (Signed)
[facility] NEPHROLOGY PROGRESS NOTE  Assessment/ Plan: Pt is a 29 y.o. yo male with history of substance abuse who was found unresponsive with probable drug overdose, PEA arrest with ROSC after about 15 minutes found to have lactic acidosis, AKI, rhabdomyolysis, aspiration pneumonia, shock liver.  Urine toxicology positive for cocaine and benzo and THC.  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  Received CRRT initially which was discontinued on 6/10.  Urine output is increasing, nonoliguric and creatinine level remains stable today.  Potassium level acceptable.  Continue to hold off on dialysis and watch for renal recovery.  We will keep the dialysis catheter for now.  Discussed with the patient's fianc and ICU team.  #Hyperkalemia: Improved after CRRT.  Monitor potassium level.  #PEA cardiac arrest now status post resuscitation.  Currently in ICU.  Off pressors.  #Ventilator dependent respiratory failure: As per PCCM team.  #Shock liver: Liver enzyme remains elevated.  Subjective: Seen and examined ICU.  Remains intubated.  Urine output around 2.4 L.  Labs acceptable.  Off of pressors.  Patient's fianc at bedside.  No other new event. Objective Vital signs in last 24 hours: Vitals:   07/27/19 0900 07/27/19 1000 07/27/19 1100 07/27/19 1135  BP:      Pulse: (!) 125 (!) 117 (!) 116   Resp: (!) 24 (!) 24 (!) 24   Temp: 99 F (37.2 C) 98.8 F (37.1 C) 98.6 F (37 C) 98.4 F (36.9 C)  TempSrc:    Core  SpO2: 97% 99% 100%   Weight:      Height:       Weight change:   Intake/Output Summary (Last 24 hours) at 07/27/2019 1205 Last data filed at 07/27/2019 1100 Gross per 24 hour  Intake 3653.35 ml  Output 3180 ml  Net 473.35 ml       Labs: Basic Metabolic Panel: Recent Labs  Lab 07/26/19 0425 07/26/19 0800 07/26/19 1121 07/26/19 2039 07/27/19 0412  07/27/19 1030  NA 144  --    < > 144 143 144  K 3.8  --    < > 4.3 3.7 3.9  CL 107  --   --  107  --  101  CO2 25  --   --  24  --  31  GLUCOSE 100*  --   --  83  --  94  BUN 24*  --   --  28*  --  31*  CREATININE 2.57*  --   --  2.72*  --  2.76*  CALCIUM 6.8*  --   --  6.0*  --  7.3*  PHOS 4.5 4.7*  --   --   --   --    < > = values in this interval not displayed.   Liver Function Tests: Recent Labs  Lab 07/26/19 2039 07/27/19 0436 07/27/19 1030  AST 912* 760* 675*  ALT 610* 542* 483*  ALKPHOS 54 48 49  BILITOT 3.7* 4.1* 4.2*  PROT 4.4* 4.8* 5.0*  ALBUMIN 2.1* 2.4* 2.4*   No results for input(s): LIPASE, AMYLASE in the last 168 hours. No results for input(s): AMMONIA in the last 168 hours. CBC: Recent Labs  Lab 07/25/19 1234 07/25/19 1240 07/26/19 1121 07/27/19 0412 07/27/19 0436  WBC 19.0*  --   --   --  10.6*  HGB 17.1*   < > 18.0* 13.3 13.6  HCT 58.2*   < >  53.0* 39.0 40.9  MCV 99.8  --   --   --  86.8  PLT 262  --   --   --  PLATELET CLUMPS NOTED ON SMEAR, UNABLE TO ESTIMATE   < > = values in this interval not displayed.   Cardiac Enzymes: Recent Labs  Lab 07/25/19 2136 07/27/19 0436  CKTOTAL 16,274* 16,632*   CBG: Recent Labs  Lab 07/27/19 0347 07/27/19 0350 07/27/19 0417 07/27/19 0758 07/27/19 1140  GLUCAP 19* 65* 106* 83 79    Iron Studies: No results for input(s): IRON, TIBC, TRANSFERRIN, FERRITIN in the last 72 hours. Studies/Results: CT ABDOMEN PELVIS WO CONTRAST  Result Date: 07/25/2019 CLINICAL DATA:  Chest trauma, mod-severe found down, unknown hx, level 2 trauma - please do not wait on Cr; Abdominal trauma Technologist notes state witnessed cardiac arrest. EXAM: CT CHEST, ABDOMEN AND PELVIS WITHOUT CONTRAST TECHNIQUE: Multidetector CT imaging of the chest, abdomen and pelvis was performed following the standard protocol without IV contrast. COMPARISON:  Chest radiograph earlier this day. FINDINGS: CT CHEST FINDINGS Cardiovascular: The  thoracic aorta is normal in caliber. Heart is upper normal in size. No pericardial effusion. There is air in the intravascular structures. Mediastinum/Nodes: Endotracheal tube tip just above the carina. Enteric tube in place with tip in the stomach, however fluid distended esophagus. No evidence of mediastinal hematoma. No obvious adenopathy allowing for lack of IV contrast. Lungs/Pleura: Small anterior right pneumothorax, less than 10%. Diffuse tree in bud and nodular opacities throughout both lungs, pattern typical of aspiration. There is debris within the bilateral mainstem bronchi with diffuse bronchial thickening. Areas of bronchial occlusion and mucous plugging in the lower lobes. No significant pleural effusion. Musculoskeletal: No acute fracture of the ribs, sternum, included clavicles or shoulder girdles. No fracture of the thoracic spine. CT ABDOMEN PELVIS FINDINGS Hepatobiliary: No obvious hepatic injury allowing for lack of IV contrast. There is no perihepatic hematoma. Questionable intraluminal sludge in the gallbladder. No pericholecystic inflammation. Pancreas: No ductal dilatation or inflammation. Spleen: No obvious splenic injury allowing for lack contrast. No perisplenic hematoma. Adrenals/Urinary Tract: No adrenal nodule or hemorrhage. No hydronephrosis or evidence of renal injury. Kidneys are unremarkable. The urinary bladder is decompressed by Foley catheter. No perivesicular fluid to suggest injury. Stomach/Bowel: Enteric tube tip in the stomach, intraluminal fluid dependently. No gastric thickening. Normal positioning of the ligament of Treitz. No obvious bowel injury or inflammation. No obstruction. Normal appendix. No visualized mesenteric hematoma. Vascular/Lymphatic: No retroperitoneal fluid. Normal caliber abdominal aorta. No bulky adenopathy. Reproductive: Prostate is unremarkable. Other: No free fluid or free air. Musculoskeletal: No fracture of the pelvis or lumbar spine. Hemi  transitional lumbosacral anatomy. IMPRESSION: 1. Small anterior right pneumothorax, less than 10%. 2. Diffuse tree in bud and nodular opacities throughout both lungs, pattern typical of aspiration. There is debris within the bilateral mainstem bronchi with diffuse bronchial thickening. Areas of bronchial occlusion and mucous plugging in the lower lobes. 3. No evidence of acute traumatic injury to the abdomen or pelvis allowing for lack of IV contrast. 4. Endotracheal tube tip just above the carina. Enteric tube tip in the stomach, however fluid distended esophagus. Critical Value/emergent results were called by telephone at the time of interpretation on 07/25/2019 at 2:31 pm to Dr Octaviano Glow , who verbally acknowledged these results. Electronically Signed   By: Keith Rake M.D.   On: 07/25/2019 14:31   CT Head Wo Contrast  Result Date: 07/25/2019 CLINICAL DATA:  Witnessed cardiac arrest EXAM: CT HEAD  WITHOUT CONTRAST CT CERVICAL SPINE WITHOUT CONTRAST TECHNIQUE: Multidetector CT imaging of the head and cervical spine was performed following the standard protocol without intravenous contrast. Multiplanar CT image reconstructions of the cervical spine were also generated. COMPARISON:  None. FINDINGS: CT HEAD FINDINGS Brain: No evidence of acute infarction, hemorrhage, hydrocephalus, extra-axial collection or mass lesion/mass effect. Vascular: No hyperdense vessel or unexpected calcification. Skull: Normal. Negative for fracture or focal lesion. Sinuses/Orbits: Air-fluid levels are noted within the sphenoid sinus. Increased mucus is noted in the posterior nasopharynx. Other: None CT CERVICAL SPINE FINDINGS Alignment: Within normal limits. Skull base and vertebrae: 7 cervical segments are well visualized. Vertebral body height is well maintained. No acute fracture or acute facet abnormality is noted. Endotracheal tube and gastric catheter are noted. The odontoid is within normal limits. Soft tissues and spinal  canal: Surrounding soft tissue structures are within normal limits without focal hematoma. Upper chest: There is air within the left innominate vein as well as the right jugular vein likely related to recent IV start. Visualized lung fields demonstrate patchy airspace opacity bilaterally slightly greater on the right than the left this would be better evaluated on upcoming CT of the chest. Other: None IMPRESSION: CT of the head: No acute intracranial abnormality is noted. Air-fluid levels in the sphenoid sinus. CT of cervical spine: No acute bony abnormality is noted. Patchy airspace opacities bilaterally which will be better evaluated on CT of the chest. Electronically Signed   By: Inez Catalina M.D.   On: 07/25/2019 14:26   CT CHEST WO CONTRAST  Result Date: 07/25/2019 CLINICAL DATA:  Chest trauma, mod-severe found down, unknown hx, level 2 trauma - please do not wait on Cr; Abdominal trauma Technologist notes state witnessed cardiac arrest. EXAM: CT CHEST, ABDOMEN AND PELVIS WITHOUT CONTRAST TECHNIQUE: Multidetector CT imaging of the chest, abdomen and pelvis was performed following the standard protocol without IV contrast. COMPARISON:  Chest radiograph earlier this day. FINDINGS: CT CHEST FINDINGS Cardiovascular: The thoracic aorta is normal in caliber. Heart is upper normal in size. No pericardial effusion. There is air in the intravascular structures. Mediastinum/Nodes: Endotracheal tube tip just above the carina. Enteric tube in place with tip in the stomach, however fluid distended esophagus. No evidence of mediastinal hematoma. No obvious adenopathy allowing for lack of IV contrast. Lungs/Pleura: Small anterior right pneumothorax, less than 10%. Diffuse tree in bud and nodular opacities throughout both lungs, pattern typical of aspiration. There is debris within the bilateral mainstem bronchi with diffuse bronchial thickening. Areas of bronchial occlusion and mucous plugging in the lower lobes. No  significant pleural effusion. Musculoskeletal: No acute fracture of the ribs, sternum, included clavicles or shoulder girdles. No fracture of the thoracic spine. CT ABDOMEN PELVIS FINDINGS Hepatobiliary: No obvious hepatic injury allowing for lack of IV contrast. There is no perihepatic hematoma. Questionable intraluminal sludge in the gallbladder. No pericholecystic inflammation. Pancreas: No ductal dilatation or inflammation. Spleen: No obvious splenic injury allowing for lack contrast. No perisplenic hematoma. Adrenals/Urinary Tract: No adrenal nodule or hemorrhage. No hydronephrosis or evidence of renal injury. Kidneys are unremarkable. The urinary bladder is decompressed by Foley catheter. No perivesicular fluid to suggest injury. Stomach/Bowel: Enteric tube tip in the stomach, intraluminal fluid dependently. No gastric thickening. Normal positioning of the ligament of Treitz. No obvious bowel injury or inflammation. No obstruction. Normal appendix. No visualized mesenteric hematoma. Vascular/Lymphatic: No retroperitoneal fluid. Normal caliber abdominal aorta. No bulky adenopathy. Reproductive: Prostate is unremarkable. Other: No free fluid or free air.  Musculoskeletal: No fracture of the pelvis or lumbar spine. Hemi transitional lumbosacral anatomy. IMPRESSION: 1. Small anterior right pneumothorax, less than 10%. 2. Diffuse tree in bud and nodular opacities throughout both lungs, pattern typical of aspiration. There is debris within the bilateral mainstem bronchi with diffuse bronchial thickening. Areas of bronchial occlusion and mucous plugging in the lower lobes. 3. No evidence of acute traumatic injury to the abdomen or pelvis allowing for lack of IV contrast. 4. Endotracheal tube tip just above the carina. Enteric tube tip in the stomach, however fluid distended esophagus. Critical Value/emergent results were called by telephone at the time of interpretation on 07/25/2019 at 2:31 pm to Dr Octaviano Glow ,  who verbally acknowledged these results. Electronically Signed   By: Keith Rake M.D.   On: 07/25/2019 14:31   CT Cervical Spine Wo Contrast  Result Date: 07/25/2019 CLINICAL DATA:  Witnessed cardiac arrest EXAM: CT HEAD WITHOUT CONTRAST CT CERVICAL SPINE WITHOUT CONTRAST TECHNIQUE: Multidetector CT imaging of the head and cervical spine was performed following the standard protocol without intravenous contrast. Multiplanar CT image reconstructions of the cervical spine were also generated. COMPARISON:  None. FINDINGS: CT HEAD FINDINGS Brain: No evidence of acute infarction, hemorrhage, hydrocephalus, extra-axial collection or mass lesion/mass effect. Vascular: No hyperdense vessel or unexpected calcification. Skull: Normal. Negative for fracture or focal lesion. Sinuses/Orbits: Air-fluid levels are noted within the sphenoid sinus. Increased mucus is noted in the posterior nasopharynx. Other: None CT CERVICAL SPINE FINDINGS Alignment: Within normal limits. Skull base and vertebrae: 7 cervical segments are well visualized. Vertebral body height is well maintained. No acute fracture or acute facet abnormality is noted. Endotracheal tube and gastric catheter are noted. The odontoid is within normal limits. Soft tissues and spinal canal: Surrounding soft tissue structures are within normal limits without focal hematoma. Upper chest: There is air within the left innominate vein as well as the right jugular vein likely related to recent IV start. Visualized lung fields demonstrate patchy airspace opacity bilaterally slightly greater on the right than the left this would be better evaluated on upcoming CT of the chest. Other: None IMPRESSION: CT of the head: No acute intracranial abnormality is noted. Air-fluid levels in the sphenoid sinus. CT of cervical spine: No acute bony abnormality is noted. Patchy airspace opacities bilaterally which will be better evaluated on CT of the chest. Electronically Signed   By:  Inez Catalina M.D.   On: 07/25/2019 14:26   US RENAL  Result Date: 07/26/2019 CLINICAL DATA:  Acute renal failure EXAM: RENAL / URINARY TRACT ULTRASOUND COMPLETE COMPARISON:  CT dated July 25, 2019 FINDINGS: Right Kidney: Renal measurements: 9.5 x 3.8 x 6 cm = volume: 76 mL. There is increased cortical echogenicity without evidence for hydronephrosis. Left Kidney: Renal measurements: 11.6 x 4.7 x 4.17 = volume: 117 mL. There is increased cortical echogenicity without evidence for hydronephrosis. Bladder: Bladder is decompressed with a Foley catheter and therefore is poorly evaluated Other: Incidentally noted is diffuse gallbladder wall thickening. IMPRESSION: 1. Echogenic kidneys bilaterally which can be seen in patients with medical renal disease. 2. No hydronephrosis. 3. Incidentally noted is gallbladder wall thickening. This is a nonspecific finding may be secondary to the patient's volume status. If there is clinical concern for acute cholecystitis, follow-up with a dedicated right upper quadrant ultrasound is recommended. Electronically Signed   By: Constance Holster M.D.   On: 07/26/2019 03:05   DG Chest Port 1 View  Result Date: 07/27/2019 CLINICAL DATA:  Respiratory  failure, cardiac arrest EXAM: PORTABLE CHEST 1 VIEW COMPARISON:  Chest radiograph from one day prior. FINDINGS: Endotracheal tube tip is 3.4 cm above the carina. Enteric tube enters stomach with the tip not seen on this image. Right apical chest tube is stable in position. Right subclavian central venous catheter terminates at the cavoatrial junction. Pacer pads overlie the left lower chest. Stable cardiomediastinal silhouette with normal heart size. Stable small right apical pneumothorax. No left pneumothorax. No pleural effusion. Patchy bibasilar lung opacities are similar. IMPRESSION: 1. Well-positioned support structures. 2. Stable small right apical pneumothorax. 3. Stable patchy bibasilar lung opacities. Electronically Signed   By:  Ilona Sorrel M.D.   On: 07/27/2019 09:15   DG Chest Port 1 View  Result Date: 07/26/2019 CLINICAL DATA:  Follow-up right pneumothorax. Acute respiratory failure. Endotracheally intubated. EXAM: PORTABLE CHEST 1 VIEW COMPARISON:  07/25/2019 FINDINGS: Endotracheal tube, and nasogastric tube, and right subclavian central venous catheter remain in appropriate position. Right-sided chest tube remains in place. A tiny less than 5% right apical pneumothorax is seen today's study. Heart size remains normal. Worsening pulmonary airspace disease is seen both lower lung zones. Mild right upper lobe airspace disease shows no significant change. IMPRESSION: Tiny less than 5% right apical pneumothorax, with right chest tube in place. Worsening airspace disease in both lower lungs. Electronically Signed   By: Marlaine Hind M.D.   On: 07/26/2019 12:59   DG CHEST PORT 1 VIEW  Result Date: 07/25/2019 CLINICAL DATA:  Central line placement. Right chest tube placement. EXAM: PORTABLE CHEST 1 VIEW COMPARISON:  CT and radiograph earlier this day. FINDINGS: Right subclavian central line tip in the lower SVC. New right chest tube with tip directed towards the apex. No visualized pneumothorax. Small amount subcutaneous emphysema in the right chest wall. Endotracheal tube tip remains at the clavicular heads. Enteric tube tip below the diaphragm in the stomach. Patchy reticulonodular opacities throughout both lungs with seen on CT. No significant pleural effusion. Unchanged heart size and mediastinal contours. IMPRESSION: 1. New right chest tube with tip directed towards the apex. No visualized pneumothorax. 2. Right subclavian central line tip in the lower SVC. 3. Patchy reticulonodular opacities throughout both lungs, progressed from radiographs earlier today. Electronically Signed   By: Keith Rake M.D.   On: 07/25/2019 16:25   DG Chest Portable 1 View  Result Date: 07/25/2019 CLINICAL DATA:  ETT placement. EXAM: PORTABLE  CHEST 1 VIEW COMPARISON:  Single-view of the chest earlier today. FINDINGS: Endotracheal tube is in place with the tip in good position just below the clavicular heads. Additional tube overlying the base of the neck seen on the prior examination is no longer present. Lungs clear. Heart size normal. Defibrillator pad in place. No pneumothorax or pleural effusion. IMPRESSION: ETT in good position. Lungs clear. Electronically Signed   By: Inge Rise M.D.   On: 07/25/2019 13:47   DG Chest Portable 1 View  Result Date: 07/25/2019 CLINICAL DATA:  Status post intubation. EXAM: PORTABLE CHEST 1 VIEW COMPARISON:  None. FINDINGS: Endotracheal tube is in place with the tip in good position at the level the clavicular heads. A second tube is identified projecting over the upper neck. The tube is looped. Defibrillator pad also noted. Lungs clear. No pneumothorax or pleural effusion. Heart size normal. No acute or focal bony abnormality. IMPRESSION: Endotracheal tube in good position. Looped tube in the upper neck could be a malpositioned NG tube. Lungs clear. Electronically Signed   By: Inge Rise  M.D.   On: 07/25/2019 13:02   DG Abd Portable 1V  Result Date: 07/25/2019 CLINICAL DATA:  Orogastric tube placement EXAM: PORTABLE ABDOMEN - 1 VIEW COMPARISON:  None. FINDINGS: Tip and side port of the orogastric tube project over the stomach. There are hazy bibasilar opacities. Right-sided chest tube tip is near the right lung apex. IMPRESSION: Orogastric tube tip and side port project over the stomach. Electronically Signed   By: Ulyses Jarred M.D.   On: 07/25/2019 22:38   EEG adult  Result Date: 07/25/2019 Lora Havens, MD     07/26/2019  9:06 AM Patient Name: George Kelly MRN: [id number] Epilepsy Attending: Lora Havens Referring Physician/Provider: Merlene Laughter, NP Date: 07/25/2019 Duration: 21.20 mins Patient history: 29 year old male presented after cardiac arrest,  now on TTM.  EEG to evaluate for  seizures. Level of alertness: Comatose AEDs during EEG study: Versed Technical aspects: This EEG study was done with scalp electrodes positioned according to the 10-20 International system of electrode placement. Electrical activity was acquired at a sampling rate of 500Hz and reviewed with a high frequency filter of 70Hz and a low frequency filter of 1Hz. EEG data were recorded continuously and digitally stored. Description: EEG showed continuous generalized 3 to 6 Hz theta-delta slowing. Reactivity was not tested during this study. Hyperventilation and photic stimulation were not performed.   ABNORMALITY -Continuous slow, generalized IMPRESSION: This study is suggestive of severe diffuse encephalopathy, nonspecific etiology. No seizures or epileptiform discharges were seen throughout the recording. Priyanka Barbra Sarks   Overnight EEG with video  Result Date: 07/26/2019 Lora Havens, MD     07/27/2019  8:47 AM Patient Name: George Kelly MRN: [id number] Epilepsy Attending: Lora Havens Referring Physician/Provider: Merlene Laughter, NP Duration: 07/25/2019 1854 to 07/26/2019 1854  Patient history: 29 year old male presented after cardiac arrest,  now on TTM.  EEG to evaluate for seizures.  Level of alertness: Comatose  AEDs during EEG study: Versed  Technical aspects: This EEG study was done with scalp electrodes positioned according to the 10-20 International system of electrode placement. Electrical activity was acquired at a sampling rate of 500Hz and reviewed with a high frequency filter of 70Hz and a low frequency filter of 1Hz. EEG data were recorded continuously and digitally stored.  Description: EEG showed continuous generalized 3 to 6 Hz theta-delta slowing. EEG was reactive to noxious stimulation Hyperventilation and photic stimulation were not performed.    ABNORMALITY -Continuous slow, generalized  IMPRESSION: This study is suggestive of severe diffuse encephalopathy, nonspecific etiology. No  seizures or epileptiform discharges were seen throughout the recording.  Lora Havens   ECHOCARDIOGRAM COMPLETE  Result Date: 07/26/2019    ECHOCARDIOGRAM REPORT   Patient Name:   [facility] Date of Exam: 07/26/2019 Medical Rec #:  [id number]      Height:       73.0 in Accession #:    [id number]     Weight:       191.8 lb Date of Birth:  06/15/1990      BSA:          2.114 m Patient Age:    29 years       BP:           111/80 mmHg Patient Gender: M              HR:           103 bpm. Exam Location:  Inpatient Procedure: 2D Echo, Cardiac Doppler and  Color Doppler Indications:    Cardiac arrest  History:        Patient has no prior history of Echocardiogram examinations.                 Arrythmias:Cardiac Arrest. Drug overdose, polysubstance abuse.  Sonographer:    Dustin Flock Referring Phys: [place]  1. Left ventricular ejection fraction, by estimation, is 55 to 60%. The left ventricle has normal function. The left ventricle has no regional wall motion abnormalities. There is moderate concentric left ventricular hypertrophy. Left ventricular diastolic parameters are consistent with Grade II diastolic dysfunction (pseudonormalization).  2. Right ventricular systolic function is normal. The right ventricular size is normal.  3. The mitral valve is normal in structure. No evidence of mitral valve regurgitation. No evidence of mitral stenosis.  4. The aortic valve is normal in structure. Aortic valve regurgitation is not visualized. No aortic stenosis is present.  5. The inferior vena cava is normal in size with greater than 50% respiratory variability, suggesting right atrial pressure of 3 mmHg. FINDINGS  Left Ventricle: Left ventricular ejection fraction, by estimation, is 55 to 60%. The left ventricle has normal function. The left ventricle has no regional wall motion abnormalities. The left ventricular internal cavity size was normal in size. There is  moderate concentric  left ventricular hypertrophy. Left ventricular diastolic parameters are consistent with Grade II diastolic dysfunction (pseudonormalization). Indeterminate filling pressures. Right Ventricle: The right ventricular size is normal. No increase in right ventricular wall thickness. Right ventricular systolic function is normal. Left Atrium: Left atrial size was normal in size. Right Atrium: Right atrial size was normal in size. Pericardium: There is no evidence of pericardial effusion. Mitral Valve: The mitral valve is normal in structure. Normal mobility of the mitral valve leaflets. No evidence of mitral valve regurgitation. No evidence of mitral valve stenosis. Tricuspid Valve: The tricuspid valve is normal in structure. Tricuspid valve regurgitation is not demonstrated. No evidence of tricuspid stenosis. Aortic Valve: The aortic valve is normal in structure. Aortic valve regurgitation is not visualized. No aortic stenosis is present. Pulmonic Valve: The pulmonic valve was normal in structure. Pulmonic valve regurgitation is not visualized. No evidence of pulmonic stenosis. Aorta: The aortic root is normal in size and structure. Venous: The inferior vena cava is normal in size with greater than 50% respiratory variability, suggesting right atrial pressure of 3 mmHg. IAS/Shunts: No atrial level shunt detected by color flow Doppler.  LEFT VENTRICLE PLAX 2D LVIDd:         3.50 cm  Diastology LVIDs:         2.90 cm  LV e' lateral:   7.62 cm/s LV PW:         1.30 cm  LV E/e' lateral: 9.0 LV IVS:        1.40 cm  LV e' medial:    6.42 cm/s LVOT diam:     2.10 cm  LV E/e' medial:  10.7 LV SV:         38 LV SV Index:   18 LVOT Area:     3.46 cm  RIGHT VENTRICLE RV Basal diam:  2.60 cm RV S prime:     7.72 cm/s TAPSE (M-mode): 2.4 cm LEFT ATRIUM             Index       RIGHT ATRIUM          Index LA diam:  2.60 cm 1.23 cm/m  RA Area:     7.48 cm LA Vol (A2C):   29.9 ml 14.15 ml/m RA Volume:   11.60 ml 5.49 ml/m LA  Vol (A4C):   34.4 ml 16.27 ml/m LA Biplane Vol: 32.6 ml 15.42 ml/m  AORTIC VALVE LVOT Vmax:   91.40 cm/s LVOT Vmean:  57.400 cm/s LVOT VTI:    0.110 m  AORTA Ao Root diam: 2.90 cm MITRAL VALVE MV Area (PHT): 5.38 cm    SHUNTS MV Decel Time: 141 msec    Systemic VTI:  0.11 m MV E velocity: 68.60 cm/s  Systemic Diam: 2.10 cm MV A velocity: 55.30 cm/s MV E/A ratio:  1.24 Skeet Latch MD Electronically signed by Skeet Latch MD Signature Date/Time: 07/26/2019/4:47:39 PM    Final     Medications: Infusions: . dextrose 75 mL/hr at 07/27/19 1100  . fentaNYL infusion INTRAVENOUS 200 mcg/hr (07/27/19 1100)  . magnesium sulfate bolus IVPB    . midazolam 1.5 mg/hr (07/27/19 1100)  . norepinephrine (LEVOPHED) Adult infusion Stopped (07/27/19 0437)  . piperacillin-tazobactam (ZOSYN)  IV Stopped (07/27/19 0857)    Scheduled Medications: . chlorhexidine gluconate (MEDLINE KIT)  15 mL Mouth Rinse BID  . Chlorhexidine Gluconate Cloth  6 each Topical Daily  . fentaNYL (SUBLIMAZE) injection  50 mcg Intravenous Once  . heparin  5,000 Units Subcutaneous Q8H  . mouth rinse  15 mL Mouth Rinse 10 times per day  . mupirocin ointment  1 application Nasal BID  . pantoprazole (PROTONIX) IV  40 mg Intravenous Q12H  . vancomycin variable dose per unstable renal function (pharmacist dosing)   Does not apply See admin instructions    have reviewed scheduled and prn medications.  Physical Exam: General: Sedated, intubated Heart:RRR, s1s2 nl, no rubs Lungs: Coarse breath sound bilateral Abdomen:soft,  non-distended Extremities:No LE edema Dialysis Access: Right femoral temporary HD catheter  Dekendrick Uzelac Tanna Furry 07/27/2019,12:05 PM  LOS: 2 days  Pager: [phone]

## 2019-07-27 NOTE — Progress Notes (Signed)
PHARMACY - PHYSICIAN COMMUNICATION CRITICAL VALUE ALERT - BLOOD CULTURE IDENTIFICATION (BCID)  George Kelly is an 29 y.o. male who presented to [facility] on 07/25/2019 with a chief complaint of unresponsiveness due to drug overdose.   Assessment:  Lab called with culture report. Blood culture (2/2 bottles) positive for Lactobacillus species.This organism is usually a contaminant, however only one set of cultures were drawn.   Name of physician (or Provider) Contacted: Dr. Isaiah Serge  Current antibiotics: Zosyn 2.25 g IV Q8H and Vancomycin   Changes to prescribed antibiotics recommended:  Patient is on recommended antibiotics - No changes needed. Recommend obtaining repeat blood cultures because one set was drawn.   Fabio Neighbors, PharmD PGY1 Ambulatory Care Resident

## 2019-07-27 NOTE — Progress Notes (Addendum)
  Amiodarone Drug - Drug Interaction Consult Note  Recommendations: Monitor QTc while on ondansetron  Amiodarone is metabolized by the cytochrome P450 system and therefore has the potential to cause many drug interactions. Amiodarone has an average plasma half-life of 50 days (range 20 to 100 days).   There is potential for drug interactions to occur several weeks or months after stopping treatment and the onset of drug interactions may be slow after initiating amiodarone.   []  Statins: Increased risk of myopathy. Simvastatin- restrict dose to 20mg  daily. Other statins: counsel patients to report any muscle pain or weakness immediately.  []  Anticoagulants: Amiodarone can increase anticoagulant effect. Consider warfarin dose reduction. Patients should be monitored closely and the dose of anticoagulant altered accordingly, remembering that amiodarone levels take several weeks to stabilize.  []  Antiepileptics: Amiodarone can increase plasma concentration of phenytoin, the dose should be reduced. Note that small changes in phenytoin dose can result in large changes in levels. Monitor patient and counsel on signs of toxicity.  []  Beta blockers: increased risk of bradycardia, AV block and myocardial depression. Sotalol - avoid concomitant use.  []   Calcium channel blockers (diltiazem and verapamil): increased risk of bradycardia, AV block and myocardial depression.  []   Cyclosporine: Amiodarone increases levels of cyclosporine. Reduced dose of cyclosporine is recommended.  []  Digoxin dose should be halved when amiodarone is started.  []  Diuretics: increased risk of cardiotoxicity if hypokalemia occurs.  []  Oral hypoglycemic agents (glyburide, glipizide, glimepiride): increased risk of hypoglycemia. Patient's glucose levels should be monitored closely when initiating amiodarone therapy.   [x]  Drugs that prolong the QT interval:  Torsades de pointes risk may be increased with concurrent use -  avoid if possible.  Monitor QTc, also keep magnesium/potassium WNL if concurrent therapy can't be avoided. Antibiotics: e.g. fluoroquinolones, erythromycin. . Antiarrhythmics: e.g. quinidine, procainamide, disopyramide, sotalol. . Antipsychotics: e.g. phenothiazines, haloperidol.  . Lithium, tricyclic antidepressants, and methadone.  Ondansetron  , PharmD, BCPS, [facility] Clinical Pharmacist  07/27/2019 6:35 PM

## 2019-07-27 NOTE — Progress Notes (Signed)
eLink Physician-Brief Progress Note Patient Name: George Kelly DOB: 06/15/1990 MRN: [id number]   Date of Service  07/27/2019  HPI/Events of Note  Atrial fibrillation.  eICU Interventions  Patient is on an Amiodarone infusion.        Thomasene Lot Nayvie Lips 07/27/2019, 10:44 PM

## 2019-07-28 ENCOUNTER — Inpatient Hospital Stay (HOSPITAL_COMMUNITY): Payer: Self-pay

## 2019-07-28 LAB — CULTURE, BLOOD (ROUTINE X 2): Special Requests: ADEQUATE

## 2019-07-28 LAB — POCT I-STAT 7, (LYTES, BLD GAS, ICA,H+H)
Acid-Base Excess: 8 mmol/L — ABNORMAL HIGH (ref 0.0–2.0)
Acid-Base Excess: 7 mmol/L — ABNORMAL HIGH (ref 0.0–2.0)
Bicarbonate: 34.8 mmol/L — ABNORMAL HIGH (ref 20.0–28.0)
Bicarbonate: 34.2 mmol/L — ABNORMAL HIGH (ref 20.0–28.0)
Calcium, Ion: 1.13 mmol/L — ABNORMAL LOW (ref 1.15–1.40)
Calcium, Ion: 1.13 mmol/L — ABNORMAL LOW (ref 1.15–1.40)
HCT: 38 % — ABNORMAL LOW (ref 39.0–52.0)
HCT: 37 % — ABNORMAL LOW (ref 39.0–52.0)
Hemoglobin: 12.9 g/dL — ABNORMAL LOW (ref 13.0–17.0)
Hemoglobin: 12.6 g/dL — ABNORMAL LOW (ref 13.0–17.0)
O2 Saturation: 92 %
O2 Saturation: 96 %
Potassium: 3.3 mmol/L — ABNORMAL LOW (ref 3.5–5.1)
Potassium: 3.1 mmol/L — ABNORMAL LOW (ref 3.5–5.1)
Sodium: 140 mmol/L (ref 135–145)
Sodium: 141 mmol/L (ref 135–145)
TCO2: 36 mmol/L — ABNORMAL HIGH (ref 22–32)
TCO2: 36 mmol/L — ABNORMAL HIGH (ref 22–32)
pCO2 arterial: 56.4 mmHg — ABNORMAL HIGH (ref 32.0–48.0)
pCO2 arterial: 61.3 mmHg — ABNORMAL HIGH (ref 32.0–48.0)
pH, Arterial: 7.398 (ref 7.350–7.450)
pH, Arterial: 7.355 (ref 7.350–7.450)
pO2, Arterial: 67 mmHg — ABNORMAL LOW (ref 83.0–108.0)
pO2, Arterial: 85 mmHg (ref 83.0–108.0)

## 2019-07-28 LAB — COMPREHENSIVE METABOLIC PANEL
ALT: 356 U/L — ABNORMAL HIGH (ref 0–44)
AST: 434 U/L — ABNORMAL HIGH (ref 15–41)
Albumin: 2.1 g/dL — ABNORMAL LOW (ref 3.5–5.0)
Alkaline Phosphatase: 64 U/L (ref 38–126)
Anion gap: 11 (ref 5–15)
BUN: 33 mg/dL — ABNORMAL HIGH (ref 6–20)
CO2: 31 mmol/L (ref 22–32)
Calcium: 7.5 mg/dL — ABNORMAL LOW (ref 8.9–10.3)
Chloride: 96 mmol/L — ABNORMAL LOW (ref 98–111)
Creatinine, Ser: 2.56 mg/dL — ABNORMAL HIGH (ref 0.61–1.24)
GFR calc Af Amer: 38 mL/min — ABNORMAL LOW (ref >60)
GFR calc non Af Amer: 33 mL/min — ABNORMAL LOW (ref >60)
Glucose, Bld: 122 mg/dL — ABNORMAL HIGH (ref 70–99)
Potassium: 3.7 mmol/L (ref 3.5–5.1)
Sodium: 138 mmol/L (ref 135–145)
Total Bilirubin: 4 mg/dL — ABNORMAL HIGH (ref 0.3–1.2)
Total Protein: 5.1 g/dL — ABNORMAL LOW (ref 6.5–8.1)

## 2019-07-28 LAB — CBC
HCT: 37.5 % — ABNORMAL LOW (ref 39.0–52.0)
Hemoglobin: 12.7 g/dL — ABNORMAL LOW (ref 13.0–17.0)
MCH: 29.3 pg (ref 26.0–34.0)
MCHC: 33.9 g/dL (ref 30.0–36.0)
MCV: 86.4 fL (ref 80.0–100.0)
Platelets: 61 10*3/uL — ABNORMAL LOW (ref 150–400)
RBC: 4.34 MIL/uL (ref 4.22–5.81)
RDW: 15.6 % — ABNORMAL HIGH (ref 11.5–15.5)
WBC: 13.5 10*3/uL — ABNORMAL HIGH (ref 4.0–10.5)
nRBC: 1.1 % — ABNORMAL HIGH (ref 0.0–0.2)

## 2019-07-28 LAB — GLUCOSE, CAPILLARY
Glucose-Capillary: 120 mg/dL — ABNORMAL HIGH (ref 70–99)
Glucose-Capillary: 102 mg/dL — ABNORMAL HIGH (ref 70–99)
Glucose-Capillary: 107 mg/dL — ABNORMAL HIGH (ref 70–99)
Glucose-Capillary: 132 mg/dL — ABNORMAL HIGH (ref 70–99)
Glucose-Capillary: 104 mg/dL — ABNORMAL HIGH (ref 70–99)

## 2019-07-28 LAB — CK: Total CK: 8171 U/L — ABNORMAL HIGH (ref 49–397)

## 2019-07-28 LAB — LACTIC ACID, PLASMA: Lactic Acid, Venous: 2 mmol/L (ref 0.5–1.9)

## 2019-07-28 LAB — MAGNESIUM: Magnesium: 2.3 mg/dL (ref 1.7–2.4)

## 2019-07-28 MED ORDER — PROPOFOL 1000 MG/100ML IV EMUL
0.0000 ug/kg/min | INTRAVENOUS | Status: DC
  Administered 2019-07-28: 50 ug/kg/min via INTRAVENOUS
  Administered 2019-07-28: 40 ug/kg/min via INTRAVENOUS
  Administered 2019-07-28: 5 ug/kg/min via INTRAVENOUS
  Administered 2019-07-29 (×2): 50 ug/kg/min via INTRAVENOUS
  Filled 2019-07-28 (×6): qty 100

## 2019-07-28 MED ORDER — PANTOPRAZOLE SODIUM 40 MG IV SOLR
40.0000 mg | INTRAVENOUS | Status: DC
  Administered 2019-07-28: 40 mg via INTRAVENOUS
  Filled 2019-07-28: qty 40

## 2019-07-28 MED ORDER — PIPERACILLIN-TAZOBACTAM 3.375 G IVPB
3.3750 g | Freq: Three times a day (TID) | INTRAVENOUS | Status: DC
  Administered 2019-07-28 – 2019-07-31 (×10): 3.375 g via INTRAVENOUS
  Filled 2019-07-28 (×10): qty 50

## 2019-07-28 MED ORDER — DEXTROSE-NACL 5-0.9 % IV SOLN: INTRAVENOUS | Status: DC

## 2019-07-28 MED ORDER — FENTANYL CITRATE (PF) 100 MCG/2ML IJ SOLN
50.0000 ug | INTRAMUSCULAR | Status: DC | PRN
  Administered 2019-07-28 – 2019-07-29 (×2): 100 ug via INTRAVENOUS
  Filled 2019-07-28 (×2): qty 2

## 2019-07-28 MED ORDER — MIDAZOLAM HCL 2 MG/2ML IJ SOLN
2.0000 mg | INTRAMUSCULAR | Status: DC | PRN
  Administered 2019-07-28 – 2019-07-29 (×4): 2 mg via INTRAVENOUS
  Filled 2019-07-28 (×5): qty 2

## 2019-07-28 NOTE — Progress Notes (Signed)
Spoke with Suzette Battiest RN re d/c cvc order, she is aware and it has been removed.

## 2019-07-28 NOTE — Progress Notes (Signed)
eLink Physician-Brief Progress Note Patient Name: George Kelly DOB: 06/15/1990 MRN: [id number]   Date of Service  07/28/2019  HPI/Events of Note  Patient's soft wrist restraints needs to be reordered as he is still at high risk for self extubation.  eICU Interventions  Restraints re-ordered.        Thomasene Lot Myrla Malanowski 07/28/2019, 5:49 AM

## 2019-07-28 NOTE — Progress Notes (Signed)
NAME:  George Kelly, MRN:  [id number], DOB:  06/15/1990, LOS: 3 ADMISSION DATE:  07/25/2019, CONSULTATION DATE:  07/25/2019 REFERRING MD:  Dr. Langston Masker, CHIEF COMPLAINT:  Cardiac arrest    Brief History   29 yo male found unresponsive with probable drug overdose.  PEA arrest with ROSC after about 15 minutes.  Found to have lactic acidosis, AKI from rhabdomyolysis, aspiration pneumonia, shock liver, hypothermia.  UDS positive for cocaine, benzo's, THC.       Past Medical History  Substance abuse   Significant Hospital Events   6/09 Admit, start TTM and LTM 6/10 CRRT 6/11 off CRRT, rewarming from TTM 6/12 A fib with RVR >> amiodarone  Consults:  Nephrology  Procedures:  ETT 6/09 >>  Rt chest tube 6/09 >>  Lt radial a line 6/09 >>  Rt [place] CVL 6/09 >>  Rt femoral HD cath 6/09 >>   Significant Diagnostic Tests:   CT head 6/09 >> no acute findings  CT chest 6/09 >> 10% Rt PTX, diffuse tree in bud and nodular opacities, debris within b/l mainstem bronchi, mucous plugging lower lobes  CT abd/pelvis 6/09 >> normal  Renal u/s 6/10 >> echogenic kidney b/l  Echo 6/10 >> EF 55 to 60%, grade 2 DD  Micro Data:  COVID 6/9 > negative Blood culture 6/9 > lactobacillus Urine culture 6/9 > negative  Antimicrobials:  Zosyn 6/09 >>  Vancomycin 6/09 >> 6/10 Zyvox 6/11 >>   Interim history/subjective:  A fib overnight.  Back in sinus.  Remains on sedation, vent support.  Objective   Blood pressure (!) 147/68, pulse 95, temperature 99 F (37.2 C), resp. rate (!) 25, height 6\' 1"  (1.854 m), weight 89.2 kg, SpO2 97 %.    Vent Mode: PCV FiO2 (%):  [70 %-80 %] 70 % Set Rate:  [24 bmp-28 bmp] 28 bmp PEEP:  [5 cmH20-10 cmH20] 10 cmH20 Plateau Pressure:  [25 cmH20-40 cmH20] 39 cmH20   Intake/Output Summary (Last 24 hours) at 07/28/2019 0737 Last data filed at 07/28/2019 0600 Gross per 24 hour  Intake 3967.81 ml  Output 2600 ml  Net 1367.81 ml   Filed Weights   07/26/19 0500 07/27/19  1800 07/28/19 0322  Weight: 87 kg 88.3 kg 89.2 kg    Examination:  General - sedated Eyes - pupils pinpoint ENT - ETT in place Cardiac - regular rate/rhythm, no murmur Chest - b/l rhonchi, Rt chest tube with 1+ air leak Abdomen - soft, non tender, decreased bowel sounds Extremities - no cyanosis, clubbing, or edema Skin - no rashes Neuro - RASS -4  Resolved Hospital Problem list   Hyperkalemia, Anion gap metabolic acidosis with lactic acidosis, Hypoglycemia  Assessment & Plan:   Acute hypoxic respiratory failure from cardiac arrest and aspiration pneumonia. - pressure control - goal SpO2 > 90% - f/u ABG, CXR  Aspiration pneumonia. - day 4 of ABx - continue zosyn, d/c zyvox  Rt pneumothorax after CPR. - chest tube to -20 cm suction - f/u CXR  Shock from cardiac arrest and sepsis 2nd to pneumonia. - goal MAP > 65, SBP > 90  A fib with RVR new onset. - back in sinus rhythm on 6/12 - continue amiodarone gtt for now  AKI from rhabdomyolysis. - renal fx improving - f/u BMET, monitor urine outpt  Acute metabolic encephalopathy from anoxia, sepsis, renal failure. - adjust sedation to keep RASS goal 0 to -1  Polysubstance abuse. -uds positive for THC, cocaine and benzo  Elevated LFT's from  shock. - f/u LFTs, improving - f/u intermittently  Gastritis. - change protonix to daily  Thrombocytopenia. - f/u CBC - hold SQ heparin for now  Best practice:  Diet: tube feeds DVT prophylaxis: SCDs GI prophylaxis: protonix Mobility: Bedrest  Code Status: Full Disposition: ICU   Labs    CMP Latest Ref Rng & Units 07/28/2019 07/27/2019 07/27/2019  Glucose 70 - 99 mg/dL 742(V) - -  BUN 6 - 20 mg/dL 95(G) - -  Creatinine 3.87 - 1.24 mg/dL 5.64(P) - -  Sodium 329 - 145 mmol/L 138 141 143  Potassium 3.5 - 5.1 mmol/L 3.7 3.8 4.1  Chloride 98 - 111 mmol/L 96(L) - -  CO2 22 - 32 mmol/L 31 - -  Calcium 8.9 - 10.3 mg/dL 7.5(L) - -  Total Protein 6.5 - 8.1 g/dL 5.1(L) - -    Total Bilirubin 0.3 - 1.2 mg/dL 4.0(H) - -  Alkaline Phos 38 - 126 U/L 64 - -  AST 15 - 41 U/L 434(H) - -  ALT 0 - 44 U/L 356(H) - -    CBC Latest Ref Rng & Units 07/28/2019 07/27/2019 07/27/2019  WBC 4.0 - 10.5 K/uL 13.5(H) - -  Hemoglobin 13.0 - 17.0 g/dL 12.7(L) 12.9(L) 13.6  Hematocrit 39 - 52 % 37.5(L) 38.0(L) 40.0  Platelets 150 - 400 K/uL 61(L) - -    ABG    Component Value Date/Time   PHART 7.443 07/27/2019 1919   PCO2ART 48.6 (H) 07/27/2019 1919   PO2ART 79 (L) 07/27/2019 1919   HCO3 33.2 (H) 07/27/2019 1919   TCO2 35 (H) 07/27/2019 1919   ACIDBASEDEF 6.0 (H) 07/26/2019 1121   O2SAT 96.0 07/27/2019 1919    CBG (last 3)  Recent Labs    07/27/19 1944 07/27/19 2319 07/28/19 0338  GLUCAP 93 141* 120*    Critical care time: 34 minutes  Coralyn Helling, MD [place] [facility] Pager - [phone] 07/28/2019, 7:45 AM

## 2019-07-28 NOTE — Plan of Care (Signed)

## 2019-07-28 NOTE — Progress Notes (Addendum)
[facility] NEPHROLOGY PROGRESS NOTE  Assessment/ Plan: Pt is a 29 y.o. yo male with history of substance abuse who was found unresponsive with probable drug overdose, PEA arrest with ROSC after about 15 minutes found to have lactic acidosis, AKI, rhabdomyolysis, aspiration pneumonia, shock liver.  Urine toxicology positive for cocaine and benzo and THC.  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  Received CRRT initially which was discontinued on 6/10.  Urine output is increasing, nonoliguric and creatinine level gradually trending down.  Potassium level acceptable.  He is having renal recovery.  No need for dialysis therefore we will plan to remove the dialysis catheter.  Expect renal recovery to his baseline.  #Hyperkalemia: Improved after CRRT.  Monitor potassium level.  #PEA cardiac arrest now status post resuscitation.  Currently in ICU.  Off pressors.  #Ventilator dependent respiratory failure: As per PCCM team.  #Shock liver: Liver enzyme remains elevated.  Plan as outlined above.  Nothing further to add therefore I will sign off.  Please call back with question.  Subjective: Seen and examined ICU.  Urine output of around 2.5 L.  Clinically remains stable.  No new event. Objective Vital signs in last 24 hours: Vitals:   07/28/19 0429 07/28/19 0500 07/28/19 0600 07/28/19 0749  BP:    133/69  Pulse: 94 92 95 92  Resp: (!) 27 (!) 25 (!) 25 (!) 21  Temp: 99 F (37.2 C) 99.3 F (37.4 C) 99 F (37.2 C) 98.2 F (36.8 C)  TempSrc: Core Core    SpO2: 100% 95% 97% 96%  Weight:      Height:       Weight change:   Intake/Output Summary (Last 24 hours) at 07/28/2019 0820 Last data filed at 07/28/2019 0600 Gross per 24 hour  Intake 3768.13 ml  Output 2375 ml  Net 1393.13 ml       Labs: Basic Metabolic Panel: Recent Labs  Lab  0000 07/26/19 0425 07/26/19 0800  07/26/19 1121 07/26/19 2039 07/27/19 0412 07/27/19 1030 07/27/19 1030 07/27/19 1548 07/27/19 1919 07/28/19 0334  NA  --  144  --    < > 144   < > 144   < > 143 141 138  K  --  3.8  --    < > 4.3   < > 3.9   < > 4.1 3.8 3.7  CL   < > 107  --   --  107  --  101  --   --   --  96*  CO2   < > 25  --   --  24  --  31  --   --   --  31  GLUCOSE   < > 100*  --   --  83  --  94  --   --   --  122*  BUN   < > 24*  --   --  28*  --  31*  --   --   --  33*  CREATININE   < > 2.57*  --   --  2.72*  --  2.76*  --   --   --  2.56*  CALCIUM   < > 6.8*  --   --  6.0*  --  7.3*  --   --   --  7.5*  PHOS  --  4.5 4.7*  --   --   --   --   --   --   --   --    < > =  values in this interval not displayed.   Liver Function Tests: Recent Labs  Lab 07/27/19 0436 07/27/19 1030 07/28/19 0334  AST 760* 675* 434*  ALT 542* 483* 356*  ALKPHOS 48 49 64  BILITOT 4.1* 4.2* 4.0*  PROT 4.8* 5.0* 5.1*  ALBUMIN 2.4* 2.4* 2.1*   No results for input(s): LIPASE, AMYLASE in the last 168 hours. No results for input(s): AMMONIA in the last 168 hours. CBC: Recent Labs  Lab 07/25/19 1234 07/25/19 1240 07/27/19 0436 07/27/19 0436 07/27/19 1548 07/27/19 1919 07/28/19 0334  WBC 19.0*  --  10.6*  --   --   --  13.5*  HGB 17.1*   < > 13.6   < > 13.6 12.9* 12.7*  HCT 58.2*   < > 40.9   < > 40.0 38.0* 37.5*  MCV 99.8  --  86.8  --   --   --  86.4  PLT 262  --  PLATELET CLUMPS NOTED ON SMEAR, UNABLE TO ESTIMATE  --   --   --  61*   < > = values in this interval not displayed.   Cardiac Enzymes: Recent Labs  Lab 07/25/19 2136 07/27/19 0436 07/28/19 0334  CKTOTAL 16,274* 16,632* 8,171*   CBG: Recent Labs  Lab 07/27/19 1541 07/27/19 1944 07/27/19 2319 07/28/19 0338 07/28/19 0758  GLUCAP 69* 93 141* 120* 102*    Iron Studies: No results for input(s): IRON, TIBC, TRANSFERRIN, FERRITIN in the last 72 hours. Studies/Results: DG Chest Port 1 View  Result Date: 07/28/2019 CLINICAL DATA:  Acute hypoxic  respiratory failure. EXAM: PORTABLE CHEST 1 VIEW COMPARISON:  07/27/2019; 07/26/2019 FINDINGS: Grossly unchanged cardiac silhouette and mediastinal contours. Stable positioning of support apparatus with interval reduction in persistent trace right apical pneumothorax. Bilateral infrahilar heterogeneous/consolidative opacities are unchanged to slightly progressed. Pulmonary vasculature remains indistinct. Query trace left-sided pleural effusion. No right-sided pleural effusion. No acute osseous abnormalities. IMPRESSION: 1. Stable positioning of support apparatus with interval reduction in persistent trace right apical pneumothorax. 2. Suspected pulmonary edema with no change to slight worsening of bilateral infrahilar heterogeneous/consolidative opacities, with differential considerations including atelectasis, alveolar pulmonary edema and/or infection/aspiration. Electronically Signed   By: John  Watts M.D.   On: 07/28/2019 05:46   DG Chest Port 1 View  Result Date: 07/27/2019 CLINICAL DATA:  Respiratory failure, cardiac arrest EXAM: PORTABLE CHEST 1 VIEW COMPARISON:  Chest radiograph from one day prior. FINDINGS: Endotracheal tube tip is 3.4 cm above the carina. Enteric tube enters stomach with the tip not seen on this image. Right apical chest tube is stable in position. Right subclavian central venous catheter terminates at the cavoatrial junction. Pacer pads overlie the left lower chest. Stable cardiomediastinal silhouette with normal heart size. Stable small right apical pneumothorax. No left pneumothorax. No pleural effusion. Patchy bibasilar lung opacities are similar. IMPRESSION: 1. Well-positioned support structures. 2. Stable small right apical pneumothorax. 3. Stable patchy bibasilar lung opacities. Electronically Signed   By: Jason A Poff M.D.   On: 07/27/2019 09:15   DG Chest Port 1 View  Result Date: 07/26/2019 CLINICAL DATA:  Follow-up right pneumothorax. Acute respiratory failure.  Endotracheally intubated. EXAM: PORTABLE CHEST 1 VIEW COMPARISON:  07/25/2019 FINDINGS: Endotracheal tube, and nasogastric tube, and right subclavian central venous catheter remain in appropriate position. Right-sided chest tube remains in place. A tiny less than 5% right apical pneumothorax is seen today's study. Heart size remains normal. Worsening pulmonary airspace disease is seen both lower lung zones. Mild right upper lobe airspace   disease shows no significant change. IMPRESSION: Tiny less than 5% right apical pneumothorax, with right chest tube in place. Worsening airspace disease in both lower lungs. Electronically Signed   By: John A Stahl M.D.   On: 07/26/2019 12:59   Overnight EEG with video  Result Date: 07/26/2019 Yadav, Priyanka O, MD     07/27/2019  8:47 AM Patient Name: George Kelly MRN: [id number] Epilepsy Attending: Priyanka O Yadav Referring Physician/Provider: Whitney Davis, NP Duration: 07/25/2019 1854 to 07/26/2019 1854  Patient history: 29-year-old male presented after cardiac arrest,  now on TTM.  EEG to evaluate for seizures.  Level of alertness: Comatose  AEDs during EEG study: Versed  Technical aspects: This EEG study was done with scalp electrodes positioned according to the 10-20 International system of electrode placement. Electrical activity was acquired at a sampling rate of 500Hz and reviewed with a high frequency filter of 70Hz and a low frequency filter of 1Hz. EEG data were recorded continuously and digitally stored.  Description: EEG showed continuous generalized 3 to 6 Hz theta-delta slowing. EEG was reactive to noxious stimulation Hyperventilation and photic stimulation were not performed.    ABNORMALITY -Continuous slow, generalized  IMPRESSION: This study is suggestive of severe diffuse encephalopathy, nonspecific etiology. No seizures or epileptiform discharges were seen throughout the recording.  Priyanka O Yadav   ECHOCARDIOGRAM COMPLETE  Result Date:  07/26/2019    ECHOCARDIOGRAM REPORT   Patient Name:   George Kelly Date of Exam: 07/26/2019 Medical Rec #:  [id number]      Height:       73.0 in Accession #:    [id number]     Weight:       191.8 lb Date of Birth:  06/15/1990      BSA:          2.114 m Patient Age:    29 years       BP:           111/80 mmHg Patient Gender: M              HR:           103 bpm. Exam Location:  Inpatient Procedure: 2D Echo, Cardiac Doppler and Color Doppler Indications:    Cardiac arrest  History:        Patient has no prior history of Echocardiogram examinations.                 Arrythmias:Cardiac Arrest. Drug overdose, polysubstance abuse.  Sonographer:    Brooke Strickland Referring Phys: [id number] WHITNEY F DAVIS IMPRESSIONS  1. Left ventricular ejection fraction, by estimation, is 55 to 60%. The left ventricle has normal function. The left ventricle has no regional wall motion abnormalities. There is moderate concentric left ventricular hypertrophy. Left ventricular diastolic parameters are consistent with Grade II diastolic dysfunction (pseudonormalization).  2. Right ventricular systolic function is normal. The right ventricular size is normal.  3. The mitral valve is normal in structure. No evidence of mitral valve regurgitation. No evidence of mitral stenosis.  4. The aortic valve is normal in structure. Aortic valve regurgitation is not visualized. No aortic stenosis is present.  5. The inferior vena cava is normal in size with greater than 50% respiratory variability, suggesting right atrial pressure of 3 mmHg. FINDINGS  Left Ventricle: Left ventricular ejection fraction, by estimation, is 55 to 60%. The left ventricle has normal function. The left ventricle has no regional wall motion abnormalities. The left ventricular internal cavity size was normal in   size. There is  moderate concentric left ventricular hypertrophy. Left ventricular diastolic parameters are consistent with Grade II diastolic dysfunction  (pseudonormalization). Indeterminate filling pressures. Right Ventricle: The right ventricular size is normal. No increase in right ventricular wall thickness. Right ventricular systolic function is normal. Left Atrium: Left atrial size was normal in size. Right Atrium: Right atrial size was normal in size. Pericardium: There is no evidence of pericardial effusion. Mitral Valve: The mitral valve is normal in structure. Normal mobility of the mitral valve leaflets. No evidence of mitral valve regurgitation. No evidence of mitral valve stenosis. Tricuspid Valve: The tricuspid valve is normal in structure. Tricuspid valve regurgitation is not demonstrated. No evidence of tricuspid stenosis. Aortic Valve: The aortic valve is normal in structure. Aortic valve regurgitation is not visualized. No aortic stenosis is present. Pulmonic Valve: The pulmonic valve was normal in structure. Pulmonic valve regurgitation is not visualized. No evidence of pulmonic stenosis. Aorta: The aortic root is normal in size and structure. Venous: The inferior vena cava is normal in size with greater than 50% respiratory variability, suggesting right atrial pressure of 3 mmHg. IAS/Shunts: No atrial level shunt detected by color flow Doppler.  LEFT VENTRICLE PLAX 2D LVIDd:         3.50 cm  Diastology LVIDs:         2.90 cm  LV e' lateral:   7.62 cm/s LV PW:         1.30 cm  LV E/e' lateral: 9.0 LV IVS:        1.40 cm  LV e' medial:    6.42 cm/s LVOT diam:     2.10 cm  LV E/e' medial:  10.7 LV SV:         38 LV SV Index:   18 LVOT Area:     3.46 cm  RIGHT VENTRICLE RV Basal diam:  2.60 cm RV S prime:     7.72 cm/s TAPSE (M-mode): 2.4 cm LEFT ATRIUM             Index       RIGHT ATRIUM          Index LA diam:        2.60 cm 1.23 cm/m  RA Area:     7.48 cm LA Vol (A2C):   29.9 ml 14.15 ml/m RA Volume:   11.60 ml 5.49 ml/m LA Vol (A4C):   34.4 ml 16.27 ml/m LA Biplane Vol: 32.6 ml 15.42 ml/m  AORTIC VALVE LVOT Vmax:   91.40 cm/s LVOT Vmean:   57.400 cm/s LVOT VTI:    0.110 m  AORTA Ao Root diam: 2.90 cm MITRAL VALVE MV Area (PHT): 5.38 cm    SHUNTS MV Decel Time: 141 msec    Systemic VTI:  0.11 m MV E velocity: 68.60 cm/s  Systemic Diam: 2.10 cm MV A velocity: 55.30 cm/s MV E/A ratio:  1.24 Skeet Latch MD Electronically signed by Skeet Latch MD Signature Date/Time: 07/26/2019/4:47:39 PM    Final     Medications: Infusions: . amiodarone 30 mg/hr (07/28/19 0609)  . dextrose 5 % and 0.9% NaCl 50 mL/hr at 07/28/19 0815  . feeding supplement (VITAL 1.5 CAL) 20 mL/hr at 07/28/19 0500  . magnesium sulfate bolus IVPB    . norepinephrine (LEVOPHED) Adult infusion Stopped (07/28/19 0545)  . piperacillin-tazobactam (ZOSYN)  IV    . propofol (DIPRIVAN) infusion      Scheduled Medications: . chlorhexidine gluconate (MEDLINE KIT)  15 mL Mouth Rinse BID  .  Chlorhexidine Gluconate Cloth  6 each Topical Daily  . mouth rinse  15 mL Mouth Rinse 10 times per day  . mupirocin ointment  1 application Nasal BID  . pantoprazole (PROTONIX) IV  40 mg Intravenous Q24H    have reviewed scheduled and prn medications.  Physical Exam: Unchanged from yesterday General: Sedated, intubated Heart:RRR, s1s2 nl, no rubs Lungs: Coarse breath sound bilateral Abdomen:soft,  non-distended Extremities:No LE edema Dialysis Access: Right femoral temporary HD catheter  Trinette Vera Tanna Furry 07/28/2019,8:20 AM  LOS: 3 days  Pager: [phone]

## 2019-07-29 LAB — CBC
HCT: 34 % — ABNORMAL LOW (ref 39.0–52.0)
Hemoglobin: 11.6 g/dL — ABNORMAL LOW (ref 13.0–17.0)
MCH: 29.3 pg (ref 26.0–34.0)
MCHC: 34.1 g/dL (ref 30.0–36.0)
MCV: 85.9 fL (ref 80.0–100.0)
Platelets: 73 10*3/uL — ABNORMAL LOW (ref 150–400)
RBC: 3.96 MIL/uL — ABNORMAL LOW (ref 4.22–5.81)
RDW: 15.9 % — ABNORMAL HIGH (ref 11.5–15.5)
WBC: 9.8 10*3/uL (ref 4.0–10.5)
nRBC: 1.1 % — ABNORMAL HIGH (ref 0.0–0.2)

## 2019-07-29 LAB — COMPREHENSIVE METABOLIC PANEL
ALT: 248 U/L — ABNORMAL HIGH (ref 0–44)
AST: 212 U/L — ABNORMAL HIGH (ref 15–41)
Albumin: 1.9 g/dL — ABNORMAL LOW (ref 3.5–5.0)
Alkaline Phosphatase: 70 U/L (ref 38–126)
Anion gap: 9 (ref 5–15)
BUN: 28 mg/dL — ABNORMAL HIGH (ref 6–20)
CO2: 31 mmol/L (ref 22–32)
Calcium: 8.1 mg/dL — ABNORMAL LOW (ref 8.9–10.3)
Chloride: 104 mmol/L (ref 98–111)
Creatinine, Ser: 2.28 mg/dL — ABNORMAL HIGH (ref 0.61–1.24)
GFR calc Af Amer: 44 mL/min — ABNORMAL LOW (ref >60)
GFR calc non Af Amer: 38 mL/min — ABNORMAL LOW (ref >60)
Glucose, Bld: 108 mg/dL — ABNORMAL HIGH (ref 70–99)
Potassium: 3.1 mmol/L — ABNORMAL LOW (ref 3.5–5.1)
Sodium: 144 mmol/L (ref 135–145)
Total Bilirubin: 2.4 mg/dL — ABNORMAL HIGH (ref 0.3–1.2)
Total Protein: 5 g/dL — ABNORMAL LOW (ref 6.5–8.1)

## 2019-07-29 LAB — POCT I-STAT 7, (LYTES, BLD GAS, ICA,H+H)
Acid-Base Excess: 8 mmol/L — ABNORMAL HIGH (ref 0.0–2.0)
Bicarbonate: 33.2 mmol/L — ABNORMAL HIGH (ref 20.0–28.0)
Calcium, Ion: 1.13 mmol/L — ABNORMAL LOW (ref 1.15–1.40)
HCT: 33 % — ABNORMAL LOW (ref 39.0–52.0)
Hemoglobin: 11.2 g/dL — ABNORMAL LOW (ref 13.0–17.0)
O2 Saturation: 96 %
Patient temperature: 98.1
Potassium: 3 mmol/L — ABNORMAL LOW (ref 3.5–5.1)
Sodium: 144 mmol/L (ref 135–145)
TCO2: 35 mmol/L — ABNORMAL HIGH (ref 22–32)
pCO2 arterial: 45.5 mmHg (ref 32.0–48.0)
pH, Arterial: 7.47 — ABNORMAL HIGH (ref 7.350–7.450)
pO2, Arterial: 77 mmHg — ABNORMAL LOW (ref 83.0–108.0)

## 2019-07-29 LAB — BASIC METABOLIC PANEL
Anion gap: 11 (ref 5–15)
BUN: 30 mg/dL — ABNORMAL HIGH (ref 6–20)
CO2: 31 mmol/L (ref 22–32)
Calcium: 8 mg/dL — ABNORMAL LOW (ref 8.9–10.3)
Chloride: 100 mmol/L (ref 98–111)
Creatinine, Ser: 2.24 mg/dL — ABNORMAL HIGH (ref 0.61–1.24)
GFR calc Af Amer: 45 mL/min — ABNORMAL LOW (ref >60)
GFR calc non Af Amer: 38 mL/min — ABNORMAL LOW (ref >60)
Glucose, Bld: 108 mg/dL — ABNORMAL HIGH (ref 70–99)
Potassium: 3 mmol/L — ABNORMAL LOW (ref 3.5–5.1)
Sodium: 142 mmol/L (ref 135–145)

## 2019-07-29 LAB — GLUCOSE, CAPILLARY
Glucose-Capillary: 102 mg/dL — ABNORMAL HIGH (ref 70–99)
Glucose-Capillary: 106 mg/dL — ABNORMAL HIGH (ref 70–99)
Glucose-Capillary: 108 mg/dL — ABNORMAL HIGH (ref 70–99)
Glucose-Capillary: 110 mg/dL — ABNORMAL HIGH (ref 70–99)
Glucose-Capillary: 111 mg/dL — ABNORMAL HIGH (ref 70–99)
Glucose-Capillary: 99 mg/dL (ref 70–99)

## 2019-07-29 LAB — MAGNESIUM
Magnesium: 2.2 mg/dL (ref 1.7–2.4)
Magnesium: 2.4 mg/dL (ref 1.7–2.4)

## 2019-07-29 LAB — PHOSPHORUS: Phosphorus: 1.9 mg/dL — ABNORMAL LOW (ref 2.5–4.6)

## 2019-07-29 LAB — CK: Total CK: 3560 U/L — ABNORMAL HIGH (ref 49–397)

## 2019-07-29 LAB — TRIGLYCERIDES: Triglycerides: 414 mg/dL — ABNORMAL HIGH (ref <150)

## 2019-07-29 MED ORDER — POTASSIUM CHLORIDE 20 MEQ/15ML (10%) PO SOLN: 20.0000 meq | ORAL | Status: DC

## 2019-07-29 MED ORDER — PANTOPRAZOLE SODIUM 40 MG PO PACK
40.0000 mg | PACK | ORAL | Status: DC
  Administered 2019-07-29 – 2019-08-31 (×33): 40 mg
  Filled 2019-07-29 (×33): qty 20

## 2019-07-29 MED ORDER — DOCUSATE SODIUM 50 MG/5ML PO LIQD
100.0000 mg | Freq: Two times a day (BID) | ORAL | Status: DC
  Administered 2019-07-29 – 2019-08-30 (×44): 100 mg
  Filled 2019-07-29 (×47): qty 10

## 2019-07-29 MED ORDER — POLYETHYLENE GLYCOL 3350 17 G PO PACK
17.0000 g | PACK | Freq: Every day | ORAL | Status: DC
  Filled 2019-07-29: qty 1

## 2019-07-29 MED ORDER — FENTANYL CITRATE (PF) 100 MCG/2ML IJ SOLN
50.0000 ug | INTRAMUSCULAR | Status: DC | PRN
  Administered 2019-07-29: 100 ug via INTRAVENOUS
  Administered 2019-07-29: 200 ug via INTRAVENOUS
  Administered 2019-07-29: 100 ug via INTRAVENOUS
  Administered 2019-07-30: 50 ug via INTRAVENOUS
  Administered 2019-07-30: 100 ug via INTRAVENOUS
  Administered 2019-07-30: 50 ug via INTRAVENOUS
  Administered 2019-07-30 – 2019-07-31 (×5): 200 ug via INTRAVENOUS
  Filled 2019-07-29 (×2): qty 4
  Filled 2019-07-29: qty 2
  Filled 2019-07-29: qty 4
  Filled 2019-07-29 (×2): qty 2
  Filled 2019-07-29: qty 4
  Filled 2019-07-29: qty 2
  Filled 2019-07-29: qty 4

## 2019-07-29 MED ORDER — LACTATED RINGERS IV SOLN: INTRAVENOUS | Status: DC

## 2019-07-29 MED ORDER — POTASSIUM CHLORIDE 20 MEQ/15ML (10%) PO SOLN
20.0000 meq | ORAL | Status: AC
  Administered 2019-07-29 (×2): 20 meq
  Filled 2019-07-29 (×3): qty 15

## 2019-07-29 MED ORDER — MIDAZOLAM 50MG/50ML (1MG/ML) PREMIX INFUSION
0.0000 mg/h | INTRAVENOUS | Status: DC
  Administered 2019-07-29: 2 mg/h via INTRAVENOUS
  Administered 2019-07-29: 4 mg/h via INTRAVENOUS
  Administered 2019-07-30: 10 mg/h via INTRAVENOUS
  Administered 2019-07-30: 6 mg/h via INTRAVENOUS
  Administered 2019-07-30 (×2): 10 mg/h via INTRAVENOUS
  Administered 2019-07-31: 6 mg/h via INTRAVENOUS
  Administered 2019-08-01 – 2019-08-02 (×3): 8 mg/h via INTRAVENOUS
  Filled 2019-07-29 (×11): qty 50

## 2019-07-29 MED ORDER — POLYETHYLENE GLYCOL 3350 17 G PO PACK
17.0000 g | PACK | Freq: Every day | ORAL | Status: DC
  Administered 2019-07-29 – 2019-08-30 (×21): 17 g
  Filled 2019-07-29 (×23): qty 1

## 2019-07-29 MED ORDER — MIDAZOLAM BOLUS VIA INFUSION
1.0000 mg | INTRAVENOUS | Status: DC | PRN
  Administered 2019-07-29 – 2019-07-30 (×3): 2 mg via INTRAVENOUS
  Administered 2019-07-30: 1 mg via INTRAVENOUS
  Administered 2019-07-30: 2 mg via INTRAVENOUS
  Filled 2019-07-29: qty 2

## 2019-07-29 MED ORDER — DOCUSATE SODIUM 50 MG/5ML PO LIQD
100.0000 mg | Freq: Two times a day (BID) | ORAL | Status: DC
  Filled 2019-07-29: qty 10

## 2019-07-29 MED ORDER — MIDAZOLAM HCL 2 MG/2ML IJ SOLN
2.0000 mg | INTRAMUSCULAR | Status: DC | PRN
  Administered 2019-07-29 – 2019-08-21 (×37): 2 mg via INTRAVENOUS
  Filled 2019-07-29 (×41): qty 2

## 2019-07-29 NOTE — Progress Notes (Signed)
[facility] ADULT ICU REPLACEMENT PROTOCOL FOR AM LAB REPLACEMENT ONLY  The patient does apply for the [facility] Adult ICU Electrolyte Replacment Protocol based on the criteria listed below:  Abnormal electrolyte(s): K (3.1)  Lakely Elmendorf DNP Elink RN

## 2019-07-29 NOTE — Progress Notes (Signed)
NAME:  George Kelly, MRN:  [id number], DOB:  06/15/1990, LOS: 4 ADMISSION DATE:  07/25/2019, CONSULTATION DATE:  07/25/2019 REFERRING MD:  Dr. Langston Masker, CHIEF COMPLAINT:  Cardiac arrest    Brief History   29 yo male found unresponsive with probable drug overdose.  PEA arrest with ROSC after about 15 minutes.  Found to have lactic acidosis, AKI from rhabdomyolysis, aspiration pneumonia, shock liver, hypothermia.  UDS positive for cocaine, benzo's, THC.       Past Medical History  Substance abuse   Significant Hospital Events   6/09 Admit, start TTM and LTM 6/10 CRRT 6/11 off CRRT, rewarming from TTM 6/12 A fib with RVR >> amiodarone  Consults:  Nephrology  Procedures:  ETT 6/09 >>  Rt chest tube 6/09 >>  Lt radial a line 6/09 >>  Rt [place] CVL 6/09 >>  Rt femoral HD cath 6/09 >> 6/12  Significant Diagnostic Tests:   CT head 6/09 >> no acute findings  CT chest 6/09 >> 10% Rt PTX, diffuse tree in bud and nodular opacities, debris within b/l mainstem bronchi, mucous plugging lower lobes  CT abd/pelvis 6/09 >> normal  Renal u/s 6/10 >> echogenic kidney b/l  Echo 6/10 >> EF 55 to 60%, grade 2 DD  Micro Data:  COVID 6/9 > negative Blood culture 6/9 > lactobacillus Urine culture 6/9 > negative  Antimicrobials:  Zosyn 6/09 >>  Vancomycin 6/09 >> 6/10 Zyvox 6/11  Interim history/subjective:  Did some pressure support this morning.  Objective   Blood pressure 127/77, pulse 83, temperature 99 F (37.2 C), temperature source Bladder, resp. rate (!) 26, height 6\' 1"  (1.854 m), weight 89.4 kg, SpO2 100 %.    Vent Mode: PCV FiO2 (%):  [50 %] 50 % Set Rate:  [26 bmp] 26 bmp PEEP:  [5 cmH20-10 cmH20] 5 cmH20 Plateau Pressure:  [21 cmH20-31 cmH20] 21 cmH20   Intake/Output Summary (Last 24 hours) at 07/29/2019 0802 Last data filed at 07/29/2019 0740 Gross per 24 hour  Intake 3260.18 ml  Output 3665 ml  Net -404.82 ml   Filed Weights   07/27/19 1800 07/28/19 0322 07/29/19 0329   Weight: 88.3 kg 89.2 kg 89.4 kg    Examination:  General - sedated Eyes - pupils reactive ENT - ETT in place Cardiac - regular rate/rhythm, no murmur Chest - scattered rhonchi, Rt chest tube in place w/o air leak Abdomen - soft, non tender, + bowel sounds Extremities - 1+ edema Skin - no rashes Neuro - RASS -3   Resolved Hospital Problem list   Hyperkalemia, Anion gap metabolic acidosis with lactic acidosis, Hypoglycemia  Assessment & Plan:   Acute hypoxic respiratory failure from cardiac arrest and aspiration pneumonia. - pressure control as rest mode - likely can try pressure support weaning trials in next 24 to 48 hrs - goal SpO2 > 90% - f/u CXR  Aspiration pneumonia. - day 5 of ABx, currently on zosyn  Rt pneumothorax after CPR. - keep chest tube on -20 cm suction for now - f/u CXR  Shock from cardiac arrest and sepsis 2nd to pneumonia. - weaning pressors to keep MAP > 65  A fib with RVR new onset. - back in sinus rhythm on 6/12 - will allow current bag of amiodarone to finish and not renew unless he develops arrhythmia again  AKI from rhabdomyolysis. Hypokalemia. - renal fx improving - f/u BMET - if pressor needs continue to improve, then consider starting diuresis in next 24 hours  Acute  metabolic encephalopathy from anoxia, sepsis, renal failure. - RASS goal 0 to -1 - triglyceride elevated 6/13 - don't think precedex would be sufficient to keep him adequately sedated at this time - restart versed gtt  Polysubstance abuse. - UDS positive for THC, cocaine and benzo  Elevated LFT's from shock. - f/u LFTs, improving - f/u intermittently  Gastritis. - daily protonix  Thrombocytopenia from sepsis. - f/u CBC - hold SQ heparin for now  Best practice:  Diet: tube feeds DVT prophylaxis: SCDs GI prophylaxis: protonix Mobility: Bedrest  Code Status: Full Disposition: ICU   Labs    CMP Latest Ref Rng & Units 07/29/2019 07/29/2019 07/29/2019    Glucose 70 - 99 mg/dL 098(J) - 191(Y)  BUN 6 - 20 mg/dL 78(G) - 95(A)  Creatinine 0.61 - 1.24 mg/dL 2.13(Y) - 8.65(H)  Sodium 135 - 145 mmol/L 144 144 142  Potassium 3.5 - 5.1 mmol/L 3.1(L) 3.0(L) 3.0(L)  Chloride 98 - 111 mmol/L 104 - 100  CO2 22 - 32 mmol/L 31 - 31  Calcium 8.9 - 10.3 mg/dL 8.1(L) - 8.0(L)  Total Protein 6.5 - 8.1 g/dL 5.0(L) - -  Total Bilirubin 0.3 - 1.2 mg/dL 2.4(H) - -  Alkaline Phos 38 - 126 U/L 70 - -  AST 15 - 41 U/L 212(H) - -  ALT 0 - 44 U/L 248(H) - -    CBC Latest Ref Rng & Units 07/29/2019 07/29/2019 07/28/2019  WBC 4.0 - 10.5 K/uL 9.8 - -  Hemoglobin 13.0 - 17.0 g/dL 11.6(L) 11.2(L) 12.6(L)  Hematocrit 39 - 52 % 34.0(L) 33.0(L) 37.0(L)  Platelets 150 - 400 K/uL 73(L) - -    ABG    Component Value Date/Time   PHART 7.470 (H) 07/29/2019 0348   PCO2ART 45.5 07/29/2019 0348   PO2ART 77 (L) 07/29/2019 0348   HCO3 33.2 (H) 07/29/2019 0348   TCO2 35 (H) 07/29/2019 0348   ACIDBASEDEF 6.0 (H) 07/26/2019 1121   O2SAT 96.0 07/29/2019 0348    CBG (last 3)  Recent Labs    07/29/19 0001 07/29/19 0413 07/29/19 0731  GLUCAP 106* 111* 110*    Critical care time: 33 minutes  Coralyn Helling, MD [facility] Pager - [phone] - [phone] 07/29/2019, 8:02 AM

## 2019-07-29 NOTE — Progress Notes (Signed)
[place] Sun TTM and Zoll pads removed per provider verbal order. Two small red blistered areas noted mid right chest area under zoll pads. Area cleansed and foam dressing applied.

## 2019-07-29 NOTE — Progress Notes (Signed)
Mother, [name], updated on patient's status via telephone.

## 2019-07-29 NOTE — Progress Notes (Addendum)
eLink Physician-Brief Progress Note Patient Name: George Kelly DOB: 06/15/1990 MRN: [id number]   Date of Service  07/29/2019  HPI/Events of Note  RN describes change in rhythm briefly in this patient who is undergoing rewarming phase of TTM. Last K was 3.1. Currently, telemetry shows NSR. EKG being obtained by RN.   eICU Interventions  Will f/u EKG.  Check lytes: iCal, Mg, BMP.  Asked RT to reduce PEEP to 5 cm H2O (from 10) as peak pressures were 35 cm H2O. This reduced them to 30 cm H2O while maintaining SpO2 98%.   ADDENDUM:  EKG reviewed. Normal sinus rhythm at 82 bpm. NANI. No ischemic changes. Continue to monitor.  Intervention Category Major Interventions: Respiratory failure - evaluation and management Intermediate Interventions: Arrhythmia - evaluation and management  Marveen Reeks Delfin Squillace 07/29/2019, 12:42 AM

## 2019-07-30 ENCOUNTER — Inpatient Hospital Stay (HOSPITAL_COMMUNITY): Payer: Self-pay

## 2019-07-30 LAB — GLUCOSE, CAPILLARY
Glucose-Capillary: 106 mg/dL — ABNORMAL HIGH (ref 70–99)
Glucose-Capillary: 107 mg/dL — ABNORMAL HIGH (ref 70–99)
Glucose-Capillary: 115 mg/dL — ABNORMAL HIGH (ref 70–99)
Glucose-Capillary: 123 mg/dL — ABNORMAL HIGH (ref 70–99)
Glucose-Capillary: 19 mg/dL — CL (ref 70–99)
Glucose-Capillary: 81 mg/dL (ref 70–99)
Glucose-Capillary: 92 mg/dL (ref 70–99)

## 2019-07-30 LAB — PROCALCITONIN: Procalcitonin: 3.99 ng/mL

## 2019-07-30 LAB — CBC
HCT: 34.1 % — ABNORMAL LOW (ref 39.0–52.0)
Hemoglobin: 11.5 g/dL — ABNORMAL LOW (ref 13.0–17.0)
MCH: 28.8 pg (ref 26.0–34.0)
MCHC: 33.7 g/dL (ref 30.0–36.0)
MCV: 85.5 fL (ref 80.0–100.0)
Platelets: 106 10*3/uL — ABNORMAL LOW (ref 150–400)
RBC: 3.99 MIL/uL — ABNORMAL LOW (ref 4.22–5.81)
RDW: 16 % — ABNORMAL HIGH (ref 11.5–15.5)
WBC: 11.3 10*3/uL — ABNORMAL HIGH (ref 4.0–10.5)
nRBC: 0.8 % — ABNORMAL HIGH (ref 0.0–0.2)

## 2019-07-30 LAB — COMPREHENSIVE METABOLIC PANEL
ALT: 189 U/L — ABNORMAL HIGH (ref 0–44)
AST: 203 U/L — ABNORMAL HIGH (ref 15–41)
Albumin: 2 g/dL — ABNORMAL LOW (ref 3.5–5.0)
Alkaline Phosphatase: 112 U/L (ref 38–126)
Anion gap: 9 (ref 5–15)
BUN: 24 mg/dL — ABNORMAL HIGH (ref 6–20)
CO2: 31 mmol/L (ref 22–32)
Calcium: 8.2 mg/dL — ABNORMAL LOW (ref 8.9–10.3)
Chloride: 110 mmol/L (ref 98–111)
Creatinine, Ser: 2.38 mg/dL — ABNORMAL HIGH (ref 0.61–1.24)
GFR calc Af Amer: 41 mL/min — ABNORMAL LOW (ref >60)
GFR calc non Af Amer: 36 mL/min — ABNORMAL LOW (ref >60)
Glucose, Bld: 115 mg/dL — ABNORMAL HIGH (ref 70–99)
Potassium: 3.5 mmol/L (ref 3.5–5.1)
Sodium: 150 mmol/L — ABNORMAL HIGH (ref 135–145)
Total Bilirubin: 1.9 mg/dL — ABNORMAL HIGH (ref 0.3–1.2)
Total Protein: 5.5 g/dL — ABNORMAL LOW (ref 6.5–8.1)

## 2019-07-30 LAB — MAGNESIUM: Magnesium: 2 mg/dL (ref 1.7–2.4)

## 2019-07-30 LAB — PHOSPHORUS: Phosphorus: 1.6 mg/dL — ABNORMAL LOW (ref 2.5–4.6)

## 2019-07-30 MED ORDER — FENTANYL 2500MCG IN NS 250ML (10MCG/ML) PREMIX INFUSION
0.0000 ug/h | INTRAVENOUS | Status: DC
  Administered 2019-07-30: 300 ug/h via INTRAVENOUS
  Administered 2019-07-30: 100 ug/h via INTRAVENOUS
  Administered 2019-07-31 (×3): 300 ug/h via INTRAVENOUS
  Administered 2019-08-01 (×2): 400 ug/h via INTRAVENOUS
  Administered 2019-08-01: 300 ug/h via INTRAVENOUS
  Administered 2019-08-02: 400 ug/h via INTRAVENOUS
  Administered 2019-08-02: 150 ug/h via INTRAVENOUS
  Administered 2019-08-03: 350 ug/h via INTRAVENOUS
  Administered 2019-08-03: 400 ug/h via INTRAVENOUS
  Administered 2019-08-03 (×2): 350 ug/h via INTRAVENOUS
  Administered 2019-08-04: 400 ug/h via INTRAVENOUS
  Filled 2019-07-30 (×16): qty 250

## 2019-07-30 MED ORDER — PRO-STAT SUGAR FREE PO LIQD
30.0000 mL | Freq: Three times a day (TID) | ORAL | Status: DC
  Administered 2019-07-30 – 2019-08-03 (×11): 30 mL via ORAL
  Filled 2019-07-30 (×12): qty 30

## 2019-07-30 MED ORDER — IOHEXOL 9 MG/ML PO SOLN
ORAL | Status: AC
  Administered 2019-07-30: 500 mL
  Filled 2019-07-30: qty 1000

## 2019-07-30 MED ORDER — LABETALOL HCL 100 MG PO TABS
100.0000 mg | ORAL_TABLET | Freq: Two times a day (BID) | ORAL | Status: DC
  Administered 2019-07-30 – 2019-08-31 (×62): 100 mg
  Filled 2019-07-30 (×66): qty 1

## 2019-07-30 MED ORDER — SODIUM CHLORIDE 0.9% FLUSH: 10.0000 mL | INTRAVENOUS | Status: DC | PRN

## 2019-07-30 MED ORDER — FREE WATER
200.0000 mL | Status: DC
  Administered 2019-07-30 – 2019-07-31 (×7): 200 mL

## 2019-07-30 MED ORDER — ACETAMINOPHEN 160 MG/5ML PO SOLN
500.0000 mg | Freq: Four times a day (QID) | ORAL | Status: DC | PRN
  Administered 2019-07-30 – 2019-08-08 (×11): 500 mg via ORAL
  Filled 2019-07-30 (×11): qty 20.3

## 2019-07-30 MED ORDER — HYDRALAZINE HCL 20 MG/ML IJ SOLN
10.0000 mg | INTRAMUSCULAR | Status: DC | PRN
  Administered 2019-07-30: 10 mg via INTRAVENOUS
  Filled 2019-07-30 (×2): qty 1

## 2019-07-30 MED ORDER — POTASSIUM PHOSPHATES 15 MMOLE/5ML IV SOLN
20.0000 mmol | Freq: Once | INTRAVENOUS | Status: AC
  Administered 2019-07-30: 20 mmol via INTRAVENOUS
  Filled 2019-07-30: qty 6.67

## 2019-07-30 MED ORDER — SODIUM CHLORIDE 0.9% FLUSH
10.0000 mL | Freq: Two times a day (BID) | INTRAVENOUS | Status: DC
  Administered 2019-07-30 – 2019-07-31 (×2): 10 mL
  Administered 2019-07-31: 30 mL
  Administered 2019-08-01 – 2019-09-13 (×68): 10 mL

## 2019-07-30 NOTE — Progress Notes (Signed)
NAME:  George Kelly, MRN:  [id number], DOB:  06/15/1990, LOS: 5 ADMISSION DATE:  07/25/2019, CONSULTATION DATE:  07/25/2019 REFERRING MD:  Dr. Renaye Rakers, CHIEF COMPLAINT:  Cardiac arrest    Brief History   29 yo male found unresponsive with probable drug overdose.  PEA arrest with ROSC after about 15 minutes.  Found to have lactic acidosis, AKI from rhabdomyolysis, aspiration pneumonia, shock liver, hypothermia.  UDS positive for cocaine, benzo's, THC.       Past Medical History  Substance abuse   Significant Hospital Events   6/09 Admit, start TTM and LTM 6/10 CRRT 6/11 off CRRT, rewarming from TTM 6/12 A fib with RVR >> amiodarone  Consults:  Nephrology signed off 6/12  Procedures:  ETT 6/09 >>  Rt chest tube 6/09 >>  Lt radial a line 6/09 >>  Rt Charlo CVL 6/09 >>  Rt femoral HD cath 6/09 >> 6/12  Significant Diagnostic Tests:   CT head 6/09 >> no acute findings  CT chest 6/09 >> 10% Rt PTX, diffuse tree in bud and nodular opacities, debris within b/l mainstem bronchi, mucous plugging lower lobes  CT abd/pelvis 6/09 >> normal  Renal u/s 6/10 >> echogenic kidney b/l  Echo 6/10 >> EF 55 to 60%, grade 2 DD  Micro Data:  COVID 6/9 > negative Blood culture 6/9 > lactobacillus Urine culture 6/9 > negative  Antimicrobials:  Zosyn 6/09 >>  Vancomycin 6/09 >> 6/10 Zyvox 6/11  Interim history/subjective:  6/14: pt appears uncomfortable on vent at this time. Not purposeful with movements, not reorientable. tmax 102.4 6/13:Did some pressure support this morning.  Objective   Blood pressure (!) 195/102, pulse (!) 116, temperature (!) 102 F (38.9 C), resp. rate (!) 27, height 6\' 1"  (1.854 m), weight 90.1 kg, SpO2 98 %.    Vent Mode: PCV FiO2 (%):  [50 %] 50 % Set Rate:  [26 bmp] 26 bmp PEEP:  [5 cmH20] 5 cmH20 Plateau Pressure:  [17 cmH20-27 cmH20] 21 cmH20   Intake/Output Summary (Last 24 hours) at 07/30/2019 0738 Last data filed at 07/30/2019 0600 Gross per 24 hour    Intake 2973.21 ml  Output 2535 ml  Net 438.21 ml   Filed Weights   07/28/19 0322 07/29/19 0329 07/30/19 0500  Weight: 89.2 kg 89.4 kg 90.1 kg    Examination:  General - intubated, awake but not following commands and not purposeful.  Eyes - pupils reactive, perrla, not tracking ENT - ETT in place, mmmp Cardiac - tachycardic /rhythm, no murmur Chest - diffuse rhonchi, Rt chest tube in place w/o air leak Abdomen - soft, non tender, + bowel sounds Extremities - 1+ edema Skin - no rashes Neuro - awake sitting up in bed, thrashing, not purposeful, not following commands.    Resolved Hospital Problem list   Hyperkalemia, Anion gap metabolic acidosis with lactic acidosis, Hypoglycemia  Assessment & Plan:   Acute hypoxic respiratory failure from cardiac arrest and aspiration pneumonia. - pressure control as rest mode - likely can try pressure support weaning trials in next 24 to 48 hrs but mental status precluding extubation at this time.  - goal SpO2 > 90% - cxr, 6/14, personally reviewed with worsening bilateral infiltrates  Aspiration pneumonia. - day 6 of ABx, currently on zosyn -still with fever will allow up to 2gm tylenol a day  Rt pneumothorax after CPR. - keep chest tube on -20 cm suction for now until ett removed - f/u CXR periodically  Shock from cardiac arrest  and sepsis 2nd to pneumonia. - off pressors this am.  -repeat bloods cx for contamination and clearance documentation  A fib with RVR new onset. - back in sinus rhythm on 6/12 - place on labetalol (scheduled for tachycardia and htn) and stop amio gtt.   AKI from rhabdomyolysis. Hypokalemia. - renal fx stable -ck improving to <4K - f/u cmp Transaminitis:  -improved  Acute metabolic encephalopathy from anoxia, sepsis, renal failure. - RASS goal 0 to -1 - triglyceride elevated 6/13, stopped propofol - will add fentanyl gtt to wean versed.   Polysubstance abuse. - UDS positive for THC, cocaine  and benzo  Elevated LFT's from shock. - f/u LFTs, improving - f/u intermittently Lactic acidosis:  -improving  Gastritis. - daily protonix  Thrombocytopenia from sepsis. - f/u CBC - hold SQ heparin for now Normocytic anemia:  -stable values.   Best practice:  Diet: tube feeds DVT prophylaxis: SCDs GI prophylaxis: protonix Mobility: Bedrest  Code Status: Full Disposition: ICU   Labs    CMP Latest Ref Rng & Units 07/29/2019 07/29/2019 07/29/2019  Glucose 70 - 99 mg/dL 108(H) - 108(H)  BUN 6 - 20 mg/dL 28(H) - 30(H)  Creatinine 0.61 - 1.24 mg/dL 2.28(H) - 2.24(H)  Sodium 135 - 145 mmol/L 144 144 142  Potassium 3.5 - 5.1 mmol/L 3.1(L) 3.0(L) 3.0(L)  Chloride 98 - 111 mmol/L 104 - 100  CO2 22 - 32 mmol/L 31 - 31  Calcium 8.9 - 10.3 mg/dL 8.1(L) - 8.0(L)  Total Protein 6.5 - 8.1 g/dL 5.0(L) - -  Total Bilirubin 0.3 - 1.2 mg/dL 2.4(H) - -  Alkaline Phos 38 - 126 U/L 70 - -  AST 15 - 41 U/L 212(H) - -  ALT 0 - 44 U/L 248(H) - -    CBC Latest Ref Rng & Units 07/30/2019 07/29/2019 07/29/2019  WBC 4.0 - 10.5 K/uL 11.3(H) 9.8 -  Hemoglobin 13.0 - 17.0 g/dL 11.5(L) 11.6(L) 11.2(L)  Hematocrit 39 - 52 % 34.1(L) 34.0(L) 33.0(L)  Platelets 150 - 400 K/uL 106(L) 73(L) -    ABG    Component Value Date/Time   PHART 7.470 (H) 07/29/2019 0348   PCO2ART 45.5 07/29/2019 0348   PO2ART 77 (L) 07/29/2019 0348   HCO3 33.2 (H) 07/29/2019 0348   TCO2 35 (H) 07/29/2019 0348   ACIDBASEDEF 6.0 (H) 07/26/2019 1121   O2SAT 96.0 07/29/2019 0348    CBG (last 3)  Recent Labs    07/30/19 0005 07/30/19 0307 07/30/19 0718  GLUCAP 92 81 106*   Critical care time: The patient is critically ill with multiple organ systems failure and requires high complexity decision making for assessment and support, frequent evaluation and titration of therapies, application of advanced monitoring technologies and extensive interpretation of multiple databases.  Critical care time 40 mins. This represents my  time independent of the NPs time taking care of the pt. This is excluding procedures.    [facility] 07/30/2019, 7:38 AM

## 2019-07-30 NOTE — TOC Initial Note (Signed)
Transition of Care [facility]) - Initial/Assessment Note    Patient Details  Name: George Kelly MRN: [id number] Date of Birth: 06/15/1990  Transition of [facility]) CM/SW Contact:    Bess Kinds, RN Phone Number: [phone] 07/30/2019, 11:42 AM  Clinical Narrative:                  Spoke with mom, Synetta Fail, and sister, Leavy Cella, at the bedside. PTA home alone, independent, and no significant medical history. No PCP or insurance - states he would go go urgent care when needed and pay out of pocket. 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   Discussed financial counselor to follow up. Confirmed contact information. Jasmine's cell phone number was updated, and patient address was updated.   Encouraged the ladies to take care of themselves. Provided NCM contact information.   TOC team following for transition needs.   Expected Discharge Plan: Skilled Nursing Facility Barriers to Discharge: Continued Medical Work up, Inadequate or no insurance   Patient Goals and CMS Choice   CMS Medicare.gov Compare Post Acute Care list provided to:: Patient Represenative (must comment) (mother, Synetta Fail, and sister, Leavy Cella)    Expected Discharge Plan and Services Expected Discharge Plan: Skilled Nursing Facility In-house Referral: Artist, Clinical Social Work Discharge Planning Services: CM Consult   Living arrangements for the past 2 months: Apartment                                      Prior Living Arrangements/Services Living arrangements for the past 2 months: Apartment Lives with:: Self                   Activities of Daily Living      Permission Sought/Granted                  Emotional Assessment Appearance:: Appears stated age Attitude/Demeanor/Rapport: Sedated, Intubated (Following Commands or Not Following Commands) Affect (typically observed): Calm   Alcohol / Substance Use: Illicit Drugs Psych  Involvement: No (comment)  Admission diagnosis:  Cardiac arrest (HCC) [I46.9] Acute renal failure (ARF) (HCC) [N17.9] Patient Active Problem List   Diagnosis Date Noted  . Cardiac arrest (HCC) 07/25/2019  . Hyperkalemia    PCP:  Patient, No Pcp Per Pharmacy:   [facility] [address] [place], [place] - [address] [place] [address] Phone: [phone] Fax: [phone]     Social Determinants of Health (SDOH) Interventions    Readmission Risk Interventions No flowsheet data found.

## 2019-07-30 NOTE — Progress Notes (Signed)
Updated mother and sister at bedside.

## 2019-07-30 NOTE — Progress Notes (Signed)
CCM made aware of need for prn HTN med via E-link. 

## 2019-07-30 NOTE — Progress Notes (Signed)
eLink Physician-Brief Progress Note Patient Name: George Kelly DOB: 06/15/1990 MRN: [id number]   Date of Service  07/30/2019  HPI/Events of Note  Hypertension with SBP > 200.  eICU Interventions  Ordered hydralazine 10-20 mg IV Q4H PRN SBP > .     Intervention Category Intermediate Interventions: Hypertension - evaluation and management  [name] 07/30/2019, 7:10 AM

## 2019-07-30 NOTE — Progress Notes (Signed)
Pt progressively febrile. Will cont tylenol, order cooling blanket.   Blood cx repeated today.  Will also order ct head chest abd pelvis.

## 2019-07-31 LAB — CBC
HCT: 32.7 % — ABNORMAL LOW (ref 39.0–52.0)
Hemoglobin: 10.8 g/dL — ABNORMAL LOW (ref 13.0–17.0)
MCH: 28.9 pg (ref 26.0–34.0)
MCHC: 33 g/dL (ref 30.0–36.0)
MCV: 87.4 fL (ref 80.0–100.0)
Platelets: 158 10*3/uL (ref 150–400)
RBC: 3.74 MIL/uL — ABNORMAL LOW (ref 4.22–5.81)
RDW: 16.7 % — ABNORMAL HIGH (ref 11.5–15.5)
WBC: 15.4 10*3/uL — ABNORMAL HIGH (ref 4.0–10.5)
nRBC: 0.3 % — ABNORMAL HIGH (ref 0.0–0.2)

## 2019-07-31 LAB — GLUCOSE, CAPILLARY
Glucose-Capillary: 115 mg/dL — ABNORMAL HIGH (ref 70–99)
Glucose-Capillary: 73 mg/dL (ref 70–99)
Glucose-Capillary: 79 mg/dL (ref 70–99)
Glucose-Capillary: 87 mg/dL (ref 70–99)
Glucose-Capillary: 88 mg/dL (ref 70–99)
Glucose-Capillary: 89 mg/dL (ref 70–99)
Glucose-Capillary: 99 mg/dL (ref 70–99)

## 2019-07-31 LAB — COMPREHENSIVE METABOLIC PANEL
ALT: 137 U/L — ABNORMAL HIGH (ref 0–44)
AST: 108 U/L — ABNORMAL HIGH (ref 15–41)
Albumin: 1.8 g/dL — ABNORMAL LOW (ref 3.5–5.0)
Alkaline Phosphatase: 104 U/L (ref 38–126)
Anion gap: 11 (ref 5–15)
BUN: 27 mg/dL — ABNORMAL HIGH (ref 6–20)
CO2: 29 mmol/L (ref 22–32)
Calcium: 7.8 mg/dL — ABNORMAL LOW (ref 8.9–10.3)
Chloride: 111 mmol/L (ref 98–111)
Creatinine, Ser: 2.25 mg/dL — ABNORMAL HIGH (ref 0.61–1.24)
GFR calc Af Amer: 44 mL/min — ABNORMAL LOW (ref >60)
GFR calc non Af Amer: 38 mL/min — ABNORMAL LOW (ref >60)
Glucose, Bld: 99 mg/dL (ref 70–99)
Potassium: 3.9 mmol/L (ref 3.5–5.1)
Sodium: 151 mmol/L — ABNORMAL HIGH (ref 135–145)
Total Bilirubin: 1.4 mg/dL — ABNORMAL HIGH (ref 0.3–1.2)
Total Protein: 5.4 g/dL — ABNORMAL LOW (ref 6.5–8.1)

## 2019-07-31 LAB — PHOSPHORUS: Phosphorus: 4.2 mg/dL (ref 2.5–4.6)

## 2019-07-31 LAB — MAGNESIUM: Magnesium: 2 mg/dL (ref 1.7–2.4)

## 2019-07-31 LAB — PROCALCITONIN: Procalcitonin: 3.95 ng/mL

## 2019-07-31 MED ORDER — VITAL 1.5 CAL PO LIQD
1000.0000 mL | ORAL | Status: DC
  Administered 2019-07-31 – 2019-08-07 (×7): 1000 mL
  Filled 2019-07-31 (×14): qty 1000

## 2019-07-31 MED ORDER — FUROSEMIDE 10 MG/ML IJ SOLN
40.0000 mg | Freq: Once | INTRAMUSCULAR | Status: AC
  Administered 2019-07-31: 40 mg via INTRAVENOUS
  Filled 2019-07-31: qty 4

## 2019-07-31 MED ORDER — LINEZOLID 600 MG/300ML IV SOLN
600.0000 mg | Freq: Two times a day (BID) | INTRAVENOUS | Status: DC
  Administered 2019-07-31 – 2019-08-01 (×4): 600 mg via INTRAVENOUS
  Filled 2019-07-31 (×5): qty 300

## 2019-07-31 MED ORDER — FREE WATER
300.0000 mL | Status: DC
  Administered 2019-07-31 – 2019-08-01 (×6): 300 mL

## 2019-07-31 MED ORDER — ALBUMIN HUMAN 25 % IV SOLN
25.0000 g | Freq: Once | INTRAVENOUS | Status: AC
  Administered 2019-07-31: 25 g via INTRAVENOUS
  Filled 2019-07-31: qty 100

## 2019-07-31 MED ORDER — SODIUM CHLORIDE 0.9 % IV SOLN
1.0000 g | Freq: Three times a day (TID) | INTRAVENOUS | Status: DC
  Administered 2019-07-31 – 2019-08-02 (×7): 1 g via INTRAVENOUS
  Filled 2019-07-31 (×9): qty 1

## 2019-07-31 NOTE — Progress Notes (Signed)
RN updated mother via phone call. All questions answered to mothers satisfaction. She is very appreciative of the care we are providing her son.

## 2019-07-31 NOTE — Progress Notes (Signed)
Nutrition Follow-up  DOCUMENTATION CODES:   Not applicable  INTERVENTION:   Tube Feeding:  Vital 1.5 at 65 ml/hr Pro-Stat 30 mL TID Provides 2640 kcals, 150 g of protein and 1186 mL of fluid  Meets 100% estimated needs  NUTRITION DIAGNOSIS:   Inadequate oral intake related to acute illness as evidenced by NPO status.  Being addressed via TF   GOAL:   Patient will meet greater than or equal to 90% of their needs  Progressing  MONITOR:   Vent status, Labs, Weight trends, TF tolerance  REASON FOR ASSESSMENT:   Consult, Ventilator Enteral/tube feeding initiation and management  ASSESSMENT:   29 yo male found unresponsive with possible drug OD, admitted post cardiac arrest to ICU on vent with severe shock, AKI with hyperkalemia and acidosis. PMH includes polysubstance abuse.   6/09 CRRT initiated 6/10 CRRT discontinued 6/14 CT abdomen negative for acute process  Patient is currently intubated on ventilator support, sedated with fentanyl and versed gtt MV: 12.2 L/min Temp (24hrs), Avg:100.7 F (38.2 C), Min:98.6 F (37 C), Max:102.9 F (39.4 C)  Propofol: NONE  Vital 1.5 at 55 ml/hr, Pro-Stat 30 mL TID via OG tube Hypernatremia; Free water flush increased to 300 mL q 4 hours today  Current weight 89.2 kg; admit weight 86.9 kg. Net + 7 L  Labs: sodium 151 (H),  Creatinine 2.25, BUN 27 Meds: colace, miralax     Diet Order:   Diet Order            Diet NPO time specified  Diet effective now                 EDUCATION NEEDS:   Not appropriate for education at this time  Skin:  Skin Assessment: Reviewed RN Assessment  Last BM:  6/15 large BM  Height:   Ht Readings from Last 1 Encounters:  07/25/19 6\' 1"  (1.854 m)    Weight:   Wt Readings from Last 1 Encounters:  07/31/19 89.2 kg    BMI:  Body mass index is 25.94 kg/m.  Estimated Nutritional Needs:   Kcal:  2565 kcals  Protein:  130-170 g  Fluid:  >/= 2 L   08/02/19 MS,  RDN, LDN, CNSC Registered Dietitian III RD Pager Number and RD On-Call Pager Number Located in [place]

## 2019-07-31 NOTE — Progress Notes (Signed)
Pharmacy Antibiotic Note  George Kelly is a 29 y.o. male admitted on 07/25/2019 with pneumonia.  Pharmacy has been consulted for Linezolid and Meropenem dosing.  Patient with worsening fever curve, rising WBC and worsening opacities of chest CT. Today is day 7 of Antibiotic therapy for aspiration pneumonia with Cefepime and Zosyn. Will broaden to Linezolid due to MRSA PCR positive and broaden to Meropenem.  CrCl - 55 ml/min.    Plan: D/c Zosyn Linezolid 600 mg IV q12hr Meropenem 1 gm IV q8hr Monitor renal function, C&S, and clinical status  Height: 6\' 1"  (185.4 cm) Weight: 89.2 kg (196 lb 10.4 oz) IBW/kg (Calculated) : 79.9  Temp (24hrs), Avg:101.1 F (38.4 C), Min:98.8 F (37.1 C), Max:102.9 F (39.4 C)  Recent Labs  Lab 07/25/19 1234 07/25/19 1540 07/25/19 1643 07/25/19 1715 07/25/19 1810 07/26/19 0905 07/26/19 1115 07/26/19 2039 07/26/19 2130 07/27/19 0436 07/27/19 0624 07/27/19 1030 07/28/19 0334 07/28/19 0706 07/29/19 0101 07/29/19 0402 07/30/19 0639 07/31/19 0542  WBC   < >  --   --   --   --   --   --   --   --  10.6*  --   --  13.5*  --   --  9.8 11.3* 15.4*  CREATININE  --   --   --   --    < >  --   --    < >  --   --   --    < > 2.56*  --  2.24* 2.28* 2.38* 2.25*  LATICACIDVEN  --    < >  --  4.6*  --   --  4.5*  --  5.4*  --  4.3*  --   --  2.0*  --   --   --   --   VANCORANDOM  --   --  <4  --   --  9  --   --   --   --   --   --   --   --   --   --   --   --    < > = values in this interval not displayed.    Estimated Creatinine Clearance: 55.2 mL/min (A) (by C-G formula based on SCr of 2.25 mg/dL (H)).    Allergies  Allergen Reactions  . Peanut-Containing Drug Products     Antimicrobials this admission: Linezolid 6/15 >>  Merrem 6/15 >>  Zosyn 6/10 >>6/15  Thank you for allowing pharmacy to be a part of this patient's care.  7/15, PharmD, [facility] [place] Clinical Pharmacist Please see AMION for all Pharmacists' Contact Phone  Numbers 07/31/2019, 8:38 AM

## 2019-07-31 NOTE — Plan of Care (Signed)
  Problem: Elimination: Goal: Will not experience complications related to bowel motility Outcome: Progressing Note: Last BM 6/15 Goal: Will not experience complications related to urinary retention Outcome: Progressing   Problem: Pain Managment: Goal: General experience of comfort will improve Outcome: Progressing   Problem: Respiratory: Goal: Ability to maintain a clear airway and adequate ventilation will improve Outcome: Progressing   Problem: Activity: Goal: Risk for activity intolerance will decrease Outcome: Not Progressing   Problem: Activity: Goal: Ability to tolerate increased activity will improve Outcome: Not Progressing Note: Unable to mobilize patient due to sedation/intubation.

## 2019-07-31 NOTE — Progress Notes (Signed)
eLink Physician-Brief Progress Note Patient Name: George Kelly DOB: 06/15/1990 MRN: [id number]   Date of Service  07/31/2019  HPI/Events of Note  Fever to 102.9 F - Patient cultured yesterday and already on Zosyn for aspiration.   eICU Interventions  Plan: 1. Cooling blanket PRN.      Intervention Category Major Interventions: Other:;Infection - evaluation and management  Blessyn [place] Dennard Nip 07/31/2019, 12:28 AM

## 2019-07-31 NOTE — Progress Notes (Signed)
Pt transported to and from CT without incident ?

## 2019-07-31 NOTE — Progress Notes (Signed)
NAME:  George Kelly, MRN:  [id number], DOB:  06/15/1990, LOS: 6 ADMISSION DATE:  07/25/2019, CONSULTATION DATE:  07/25/2019 REFERRING MD:  Dr. Renaye Rakers, CHIEF COMPLAINT:  Cardiac arrest    Brief History   29 yo male found unresponsive with probable drug overdose.  PEA arrest with ROSC after about 15 minutes.  Found to have lactic acidosis, AKI from rhabdomyolysis, aspiration pneumonia, shock liver, hypothermia.  UDS positive for cocaine, benzo's, THC.       Past Medical History  Substance abuse   Significant Hospital Events   6/09 Admit, start TTM and LTM 6/10 CRRT 6/11 off CRRT, rewarming from TTM 6/12 A fib with RVR >> amiodarone  Consults:  Nephrology signed off 6/12  Procedures:  ETT 6/09 >>  Rt chest tube 6/09 >>  Lt radial a line 6/09 >>  Rt Maurice CVL 6/09 >>  Rt femoral HD cath 6/09 >> 6/12  Significant Diagnostic Tests:   CT head 6/09 >> no acute findings  CT chest 6/09 >> 10% Rt PTX, diffuse tree in bud and nodular opacities, debris within b/l mainstem bronchi, mucous plugging lower lobes  CT abd/pelvis 6/09 >> normal  Renal u/s 6/10 >> echogenic kidney b/l  Echo 6/10 >> EF 55 to 60%, grade 2 DD  cth 6/14: No acute intracranial abnormality.  Ct chest abd pelvis 6/14: Extensive bilateral airspace opacities, worsening since prior study. Dense consolidation in both lower lobes with small bilateral effusions. Right chest tube in place. Small residual right anterior pneumothorax. No acute findings in the abdomen or pelvis.  Micro Data:  COVID 6/9 > negative Blood culture 6/9 > lactobacillus Urine culture 6/9 > negative 6/14 blood: ngtd 6/15 resp:   Antimicrobials:  Zosyn 6/09 >> 6/14 Vancomycin 6/09 >> 6/10 Zyvox 6/11 linezolid 6/15-> merrem 6/15->  Interim history/subjective:  6/15: cont to fever yesterday tmax 102.9, req tylenol and cooling blanket. Sputum cx pending, blood cx pending. abx changed as well. Cth negative (still not purposeful but no  asymmetry on exam). Ct chest with worsening infiltrates, ct abd pelvis negative. Renal function stable.  6/14: pt appears uncomfortable on vent at this time. Not purposeful with movements, not reorientable. tmax 102.4 6/13:Did some pressure support this morning.  Objective   Blood pressure 118/69, pulse 87, temperature 98.8 F (37.1 C), resp. rate (!) 26, height 6\' 1"  (1.854 m), weight 89.2 kg, SpO2 100 %.    Vent Mode: PCV FiO2 (%):  [50 %-70 %] 70 % Set Rate:  [26 bmp] 26 bmp PEEP:  [5 cmH20-8 cmH20] 8 cmH20 Plateau Pressure:  [23 cmH20-27 cmH20] 26 cmH20   Intake/Output Summary (Last 24 hours) at 07/31/2019 0837 Last data filed at 07/31/2019 0600 Gross per 24 hour  Intake 4155.8 ml  Output 1905 ml  Net 2250.8 ml   Filed Weights   07/29/19 0329 07/30/19 0500 07/31/19 0500  Weight: 89.4 kg 90.1 kg 89.2 kg    Examination:  General - intubated, awake but not following commands and not purposeful.  Eyes - pupils reactive, perrla, not tracking ENT - ETT in place, mmmp Cardiac - tachycardic /rhythm, no murmur Chest - diffuse rhonchi, Rt chest tube in place w/o air leak Abdomen - soft, non tender, + bowel sounds Extremities - 1+ edema Skin - no rashes Neuro - awake sitting up in bed, thrashing, not purposeful, not following commands.    Resolved Hospital Problem list   Hyperkalemia, Anion gap metabolic acidosis with lactic acidosis, Hypoglycemia  Assessment & Plan:  Acute hypoxic respiratory failure from cardiac arrest and aspiration pneumonia. - pressure control  -changing sedation around with increased agitation yesterday - goal SpO2 > 90% - cxr, 6/14, personally reviewed with worsening bilateral infiltrates   pneumonia gp vs gn, aspiration event as well. -with worsening fever, wbc will broaden abx.  - changed abx to merrem and linezolid (low vanc level reagent and arf prompted this choice) -if fevers will allow up to 2gm tylenol a day  Rt pneumothorax after CPR -  keep chest tube on -20 cm suction for now until ett removed - f/u CXR periodically -ct with residual anterior ptx  Shock from cardiac arrest and sepsis 2nd to pneumonia. -remains off pressors.  -repeat bloods cx pending for contamination and clearance documentation  A fib with RVR new onset. - back in sinus rhythm on 6/12 - cont labetalol  AKI from rhabdomyolysis. Hypokalemia. - renal fx stable -ck improving to <4K - cont to follow uop and renal indices Hypernatremia:  -increase free water today.  Transaminitis:  -improved  Acute metabolic encephalopathy from anoxia, sepsis, renal failure. - RASS goal 0 to -1 - triglyceride elevated 6/13, stopped propofol - cont fentanyl gtt to wean versed  Polysubstance abuse. - UDS positive for THC, cocaine and benzo  Lactic acidosis:  resolved  Gastritis. - daily protonix  Thrombocytopenia from sepsis. - f/u CBC -resume subcut heparin  Normocytic anemia:  -stable values.   Best practice:  Diet: tube feeds DVT prophylaxis: heparin subcut GI prophylaxis: protonix Mobility: Bedrest  Code Status: Full Disposition: ICU   Labs    CMP Latest Ref Rng & Units 07/31/2019 07/30/2019 07/29/2019  Glucose 70 - 99 mg/dL 99 115(H) 108(H)  BUN 6 - 20 mg/dL 27(H) 24(H) 28(H)  Creatinine 0.61 - 1.24 mg/dL 2.25(H) 2.38(H) 2.28(H)  Sodium 135 - 145 mmol/L 151(H) 150(H) 144  Potassium 3.5 - 5.1 mmol/L 3.9 3.5 3.1(L)  Chloride 98 - 111 mmol/L 111 110 104  CO2 22 - 32 mmol/L 29 31 31   Calcium 8.9 - 10.3 mg/dL 7.8(L) 8.2(L) 8.1(L)  Total Protein 6.5 - 8.1 g/dL 5.4(L) 5.5(L) 5.0(L)  Total Bilirubin 0.3 - 1.2 mg/dL 1.4(H) 1.9(H) 2.4(H)  Alkaline Phos 38 - 126 U/L 104 112 70  AST 15 - 41 U/L 108(H) 203(H) 212(H)  ALT 0 - 44 U/L 137(H) 189(H) 248(H)    CBC Latest Ref Rng & Units 07/31/2019 07/30/2019 07/29/2019  WBC 4.0 - 10.5 K/uL 15.4(H) 11.3(H) 9.8  Hemoglobin 13.0 - 17.0 g/dL 10.8(L) 11.5(L) 11.6(L)  Hematocrit 39 - 52 % 32.7(L) 34.1(L)  34.0(L)  Platelets 150 - 400 K/uL 158 106(L) 73(L)    ABG    Component Value Date/Time   PHART 7.470 (H) 07/29/2019 0348   PCO2ART 45.5 07/29/2019 0348   PO2ART 77 (L) 07/29/2019 0348   HCO3 33.2 (H) 07/29/2019 0348   TCO2 35 (H) 07/29/2019 0348   ACIDBASEDEF 6.0 (H) 07/26/2019 1121   O2SAT 96.0 07/29/2019 0348    CBG (last 3)  Recent Labs    07/31/19 0028 07/31/19 0321 07/31/19 0754  GLUCAP 89 115* 99   Critical care time: The patient is critically ill with multiple organ systems failure and requires high complexity decision making for assessment and support, frequent evaluation and titration of therapies, application of advanced monitoring technologies and extensive interpretation of multiple databases.  Critical care time 45 mins. This represents my time independent of the NPs time taking care of the pt. This is excluding procedures.    Janett Billow  Gaynell Face DO [facility] 07/31/2019, 8:37 AM

## 2019-08-01 ENCOUNTER — Inpatient Hospital Stay (HOSPITAL_COMMUNITY): Payer: Self-pay

## 2019-08-01 LAB — COMPREHENSIVE METABOLIC PANEL
ALT: 109 U/L — ABNORMAL HIGH (ref 0–44)
AST: 92 U/L — ABNORMAL HIGH (ref 15–41)
Albumin: 2.2 g/dL — ABNORMAL LOW (ref 3.5–5.0)
Alkaline Phosphatase: 99 U/L (ref 38–126)
Anion gap: 9 (ref 5–15)
BUN: 38 mg/dL — ABNORMAL HIGH (ref 6–20)
CO2: 30 mmol/L (ref 22–32)
Calcium: 8.3 mg/dL — ABNORMAL LOW (ref 8.9–10.3)
Chloride: 112 mmol/L — ABNORMAL HIGH (ref 98–111)
Creatinine, Ser: 1.93 mg/dL — ABNORMAL HIGH (ref 0.61–1.24)
GFR calc Af Amer: 53 mL/min — ABNORMAL LOW (ref >60)
GFR calc non Af Amer: 46 mL/min — ABNORMAL LOW (ref >60)
Glucose, Bld: 91 mg/dL (ref 70–99)
Potassium: 4 mmol/L (ref 3.5–5.1)
Sodium: 151 mmol/L — ABNORMAL HIGH (ref 135–145)
Total Bilirubin: 1 mg/dL (ref 0.3–1.2)
Total Protein: 5.9 g/dL — ABNORMAL LOW (ref 6.5–8.1)

## 2019-08-01 LAB — CBC
HCT: 36.3 % — ABNORMAL LOW (ref 39.0–52.0)
Hemoglobin: 11.7 g/dL — ABNORMAL LOW (ref 13.0–17.0)
MCH: 28.8 pg (ref 26.0–34.0)
MCHC: 32.2 g/dL (ref 30.0–36.0)
MCV: 89.4 fL (ref 80.0–100.0)
Platelets: 263 10*3/uL (ref 150–400)
RBC: 4.06 MIL/uL — ABNORMAL LOW (ref 4.22–5.81)
RDW: 17.2 % — ABNORMAL HIGH (ref 11.5–15.5)
WBC: 19.4 10*3/uL — ABNORMAL HIGH (ref 4.0–10.5)
nRBC: 0.3 % — ABNORMAL HIGH (ref 0.0–0.2)

## 2019-08-01 LAB — GLUCOSE, CAPILLARY
Glucose-Capillary: 89 mg/dL (ref 70–99)
Glucose-Capillary: 86 mg/dL (ref 70–99)
Glucose-Capillary: 91 mg/dL (ref 70–99)
Glucose-Capillary: 101 mg/dL — ABNORMAL HIGH (ref 70–99)
Glucose-Capillary: 93 mg/dL (ref 70–99)
Glucose-Capillary: 114 mg/dL — ABNORMAL HIGH (ref 70–99)

## 2019-08-01 LAB — MAGNESIUM: Magnesium: 2.1 mg/dL (ref 1.7–2.4)

## 2019-08-01 LAB — PHOSPHORUS: Phosphorus: 4.6 mg/dL (ref 2.5–4.6)

## 2019-08-01 LAB — PROCALCITONIN: Procalcitonin: 3.08 ng/mL

## 2019-08-01 MED ORDER — HYDROMORPHONE BOLUS VIA INFUSION
0.5000 mg | INTRAVENOUS | Status: DC | PRN
  Administered 2019-08-03 – 2019-08-09 (×4): 0.5 mg via INTRAVENOUS
  Filled 2019-08-01: qty 1

## 2019-08-01 MED ORDER — WHITE PETROLATUM EX OINT
TOPICAL_OINTMENT | CUTANEOUS | Status: AC
  Administered 2019-08-01: 0.2
  Filled 2019-08-01: qty 28.35

## 2019-08-01 MED ORDER — METOLAZONE 5 MG PO TABS
5.0000 mg | ORAL_TABLET | Freq: Once | ORAL | Status: AC
  Administered 2019-08-01: 5 mg via ORAL
  Filled 2019-08-01: qty 1

## 2019-08-01 MED ORDER — SODIUM CHLORIDE 0.9 % IV SOLN
0.5000 mg/h | INTRAVENOUS | Status: DC
  Administered 2019-08-01: 1 mg/h via INTRAVENOUS
  Administered 2019-08-02 (×2): 4 mg/h via INTRAVENOUS
  Administered 2019-08-03: 2 mg/h via INTRAVENOUS
  Administered 2019-08-04: 2.5 mg/h via INTRAVENOUS
  Administered 2019-08-05 (×2): 4 mg/h via INTRAVENOUS
  Administered 2019-08-06: 2 mg/h via INTRAVENOUS
  Administered 2019-08-06: 4 mg/h via INTRAVENOUS
  Administered 2019-08-07 – 2019-08-09 (×2): 2 mg/h via INTRAVENOUS
  Filled 2019-08-01 (×18): qty 5

## 2019-08-01 MED ORDER — FREE WATER
500.0000 mL | Status: DC
  Administered 2019-08-01 – 2019-08-08 (×30): 500 mL

## 2019-08-01 MED ORDER — LIP MEDEX EX OINT
TOPICAL_OINTMENT | CUTANEOUS | Status: DC | PRN
  Filled 2019-08-01: qty 7

## 2019-08-01 MED ORDER — ENOXAPARIN SODIUM 40 MG/0.4ML ~~LOC~~ SOLN
40.0000 mg | SUBCUTANEOUS | Status: DC
  Administered 2019-08-01 – 2019-09-13 (×44): 40 mg via SUBCUTANEOUS
  Filled 2019-08-01 (×45): qty 0.4

## 2019-08-01 NOTE — Progress Notes (Signed)
NAME:  George Kelly, MRN:  [id number], DOB:  06/15/1990, LOS: 7 ADMISSION DATE:  07/25/2019, CONSULTATION DATE:  07/25/2019 REFERRING MD:  Dr. Renaye Rakers, CHIEF COMPLAINT:  Cardiac arrest    Brief History   29 yo male found unresponsive with probable drug overdose.  PEA arrest with ROSC after about 15 minutes.  Found to have lactic acidosis, AKI from rhabdomyolysis, aspiration pneumonia, shock liver, hypothermia.  UDS positive for cocaine, benzo's, THC.       Past Medical History  Substance abuse   Significant Hospital Events   6/09 Admit, start TTM and LTM 6/10 CRRT 6/11 off CRRT, rewarming from TTM 6/12 A fib with RVR >> amiodarone  Consults:  Nephrology signed off 6/12  Procedures:  ETT 6/09 >>  Rt chest tube 6/09 >>  Lt radial a line 6/09 >>  Rt [place] CVL 6/09 >>  Rt femoral HD cath 6/09 >> 6/12  Significant Diagnostic Tests:   CT head 6/09 >> no acute findings  CT chest 6/09 >> 10% Rt PTX, diffuse tree in bud and nodular opacities, debris within b/l mainstem bronchi, mucous plugging lower lobes  CT abd/pelvis 6/09 >> normal  Renal u/s 6/10 >> echogenic kidney b/l  Echo 6/10 >> EF 55 to 60%, grade 2 DD  cth 6/14: No acute intracranial abnormality.  Ct chest abd pelvis 6/14: Extensive bilateral airspace opacities, worsening since prior study. Dense consolidation in both lower lobes with small bilateral effusions. Right chest tube in place. Small residual right anterior pneumothorax. No acute findings in the abdomen or pelvis.  Micro Data:  COVID 6/9 > negative Blood culture 6/9 > lactobacillus Urine culture 6/9 > negative 6/14 blood: ngtd 6/15 resp:   Antimicrobials:  Zosyn 6/09 >> 6/14 Vancomycin 6/09 >> 6/10 Zyvox 6/11 linezolid 6/15-> merrem 6/15->  Interim history/subjective:  No events, remains on high dose fentanyl.  Objective   Blood pressure (!) 161/93, pulse (!) 118, temperature 99.6 F (37.6 C), temperature source Core, resp. rate (!) 22,  height 6\' 1"  (1.854 m), weight 89.2 kg, SpO2 97 %.    Vent Mode: PCV FiO2 (%):  [50 %-70 %] 50 % Set Rate:  [26 bmp] 26 bmp PEEP:  [8 cmH20] 8 cmH20 Plateau Pressure:  [19 cmH20-26 cmH20] 25 cmH20   Intake/Output Summary (Last 24 hours) at 08/01/2019 0739 Last data filed at 08/01/2019 0600 Gross per 24 hour  Intake 3805.73 ml  Output 2710 ml  Net 1095.73 ml   Filed Weights   07/29/19 0329 07/30/19 0500 07/31/19 0500  Weight: 89.4 kg 90.1 kg 89.2 kg    Examination:  GEN: young man on vent HEENT: ETT in place, copious secretions CV: RRR, ext warm PULM: Rhonci R>L, doing well on PS GI: Soft, +BS EXT: No edema NEURO: I can get him to withdraw on left, nothing on right, opens eyes to voice, need to lighten his sedation PSYCH: RASS -1 SKIN: No rashes  Sodium 151 UoP 2.6L BM 6/15 Pct 3.9>>3 WBC 15>>19 Cr 2.25>>1.9  CXR R>L airspace disease Sugars low   Resolved/Stable Hospital Problem list   Hyperkalemia, Anion gap metabolic acidosis with lactic acidosis, Hypoglycemia Shock Reactive Afib/RVR Thrombocytopenia Anemia Transaminitis Gastritis  Assessment & Plan:   Acute hypoxic respiratory failure from cardiac arrest and aspiration pneumonia. - pressure control  -changing sedation around with increased agitation yesterday - goal SpO2 > 90% - cxr, 6/14, personally reviewed with worsening bilateral infiltrates   pneumonia gp vs gn, aspiration event as well. -with worsening fever,  wbc will broaden abx.  - changed abx to merrem and linezolid (low vanc level reagent and arf prompted this choice) -if fevers will allow up to 2gm tylenol a day  Rt pneumothorax after CPR - keep chest tube on -20 cm suction for now until ett removed - f/u CXR periodically -ct with residual anterior ptx  AKI from rhabdomyolysis. Hypokalemia. - renal fx stable - ck improving to <4K - cont to follow uop and renal indices Hypernatremia:  -increase free water today. Dose of  metolazone. Transaminitis:  -improved  Acute metabolic encephalopathy from anoxia, sepsis, renal failure. - wean sedation today to get better idea of where we are - may end up getting MRI  Polysubstance abuse. - UDS positive for THC, cocaine and benzo  Best practice:  Diet: tube feeds DVT prophylaxis: lovenox GI prophylaxis: protonix Mobility: Bedrest  Code Status: Full Disposition: ICU     The patient is critically ill with multiple organ systems failure and requires high complexity decision making for assessment and support, frequent evaluation and titration of therapies, application of advanced monitoring technologies and extensive interpretation of multiple databases. Critical Care Time devoted to patient care services described in this note independent of APP/resident time (if applicable)  is 45 minutes.   Erskine Emery MD [facility] 08/01/2019 7:42 AM Personal pager: [phone] If unanswered, please page CCM On-call: [phone]

## 2019-08-02 ENCOUNTER — Inpatient Hospital Stay (HOSPITAL_COMMUNITY): Payer: Self-pay

## 2019-08-02 DIAGNOSIS — J9611 Chronic respiratory failure with hypoxia: Secondary | ICD-10-CM

## 2019-08-02 DIAGNOSIS — J9601 Acute respiratory failure with hypoxia: Secondary | ICD-10-CM | POA: Diagnosis present

## 2019-08-02 LAB — CBC
HCT: 30.5 % — ABNORMAL LOW (ref 39.0–52.0)
Hemoglobin: 10 g/dL — ABNORMAL LOW (ref 13.0–17.0)
MCH: 28.5 pg (ref 26.0–34.0)
MCHC: 32.8 g/dL (ref 30.0–36.0)
MCV: 86.9 fL (ref 80.0–100.0)
Platelets: 351 10*3/uL (ref 150–400)
RBC: 3.51 MIL/uL — ABNORMAL LOW (ref 4.22–5.81)
RDW: 16.2 % — ABNORMAL HIGH (ref 11.5–15.5)
WBC: 20.7 10*3/uL — ABNORMAL HIGH (ref 4.0–10.5)
nRBC: 0.1 % (ref 0.0–0.2)

## 2019-08-02 LAB — CULTURE, RESPIRATORY W GRAM STAIN

## 2019-08-02 LAB — COMPREHENSIVE METABOLIC PANEL
ALT: 87 U/L — ABNORMAL HIGH (ref 0–44)
AST: 99 U/L — ABNORMAL HIGH (ref 15–41)
Albumin: 1.8 g/dL — ABNORMAL LOW (ref 3.5–5.0)
Alkaline Phosphatase: 160 U/L — ABNORMAL HIGH (ref 38–126)
Anion gap: 11 (ref 5–15)
BUN: 39 mg/dL — ABNORMAL HIGH (ref 6–20)
CO2: 29 mmol/L (ref 22–32)
Calcium: 8.3 mg/dL — ABNORMAL LOW (ref 8.9–10.3)
Chloride: 112 mmol/L — ABNORMAL HIGH (ref 98–111)
Creatinine, Ser: 2.07 mg/dL — ABNORMAL HIGH (ref 0.61–1.24)
GFR calc Af Amer: 49 mL/min — ABNORMAL LOW (ref >60)
GFR calc non Af Amer: 42 mL/min — ABNORMAL LOW (ref >60)
Glucose, Bld: 96 mg/dL (ref 70–99)
Potassium: 3.5 mmol/L (ref 3.5–5.1)
Sodium: 152 mmol/L — ABNORMAL HIGH (ref 135–145)
Total Bilirubin: 1.1 mg/dL (ref 0.3–1.2)
Total Protein: 5.2 g/dL — ABNORMAL LOW (ref 6.5–8.1)

## 2019-08-02 LAB — GLUCOSE, CAPILLARY
Glucose-Capillary: 72 mg/dL (ref 70–99)
Glucose-Capillary: 83 mg/dL (ref 70–99)
Glucose-Capillary: 90 mg/dL (ref 70–99)
Glucose-Capillary: 90 mg/dL (ref 70–99)
Glucose-Capillary: 93 mg/dL (ref 70–99)
Glucose-Capillary: 96 mg/dL (ref 70–99)

## 2019-08-02 LAB — PHOSPHORUS: Phosphorus: 2.9 mg/dL (ref 2.5–4.6)

## 2019-08-02 LAB — TRIGLYCERIDES: Triglycerides: 191 mg/dL — ABNORMAL HIGH (ref <150)

## 2019-08-02 LAB — MAGNESIUM: Magnesium: 1.9 mg/dL (ref 1.7–2.4)

## 2019-08-02 MED ORDER — VECURONIUM BROMIDE 10 MG IV SOLR: 10.0000 mg | Freq: Once | INTRAVENOUS | Status: AC

## 2019-08-02 MED ORDER — MIDAZOLAM HCL 2 MG/2ML IJ SOLN
5.0000 mg | Freq: Once | INTRAMUSCULAR | Status: AC
  Administered 2019-08-09: 5 mg via INTRAVENOUS
  Filled 2019-08-02 (×2): qty 6

## 2019-08-02 MED ORDER — SODIUM CHLORIDE 0.9 % IV SOLN
2.0000 g | Freq: Three times a day (TID) | INTRAVENOUS | Status: AC
  Administered 2019-08-02 – 2019-08-09 (×21): 2 g via INTRAVENOUS
  Filled 2019-08-02 (×21): qty 2

## 2019-08-02 MED ORDER — FENTANYL CITRATE (PF) 100 MCG/2ML IJ SOLN: 200.0000 ug | Freq: Once | INTRAMUSCULAR | Status: DC

## 2019-08-02 MED ORDER — FENTANYL CITRATE (PF) 100 MCG/2ML IJ SOLN
INTRAMUSCULAR | Status: AC
  Filled 2019-08-02: qty 2

## 2019-08-02 MED ORDER — MIDAZOLAM HCL 2 MG/2ML IJ SOLN
INTRAMUSCULAR | Status: AC
  Filled 2019-08-02: qty 2

## 2019-08-02 MED ORDER — PROPOFOL 1000 MG/100ML IV EMUL
5.0000 ug/kg/min | INTRAVENOUS | Status: DC
  Administered 2019-08-02 – 2019-08-03 (×2): 50 ug/kg/min via INTRAVENOUS
  Administered 2019-08-03: 45 ug/kg/min via INTRAVENOUS
  Administered 2019-08-03: 60 ug/kg/min via INTRAVENOUS
  Administered 2019-08-03: 50 ug/kg/min via INTRAVENOUS
  Administered 2019-08-03 (×2): 45 ug/kg/min via INTRAVENOUS
  Administered 2019-08-03: 40 ug/kg/min via INTRAVENOUS
  Administered 2019-08-04: 50 ug/kg/min via INTRAVENOUS
  Administered 2019-08-04: 20 ug/kg/min via INTRAVENOUS
  Administered 2019-08-04: 60 ug/kg/min via INTRAVENOUS
  Administered 2019-08-04: 50 ug/kg/min via INTRAVENOUS
  Administered 2019-08-04: 40 ug/kg/min via INTRAVENOUS
  Administered 2019-08-05: 10 ug/kg/min via INTRAVENOUS
  Administered 2019-08-05 (×2): 50 ug/kg/min via INTRAVENOUS
  Administered 2019-08-05: 25 ug/kg/min via INTRAVENOUS
  Administered 2019-08-06: 10 ug/kg/min via INTRAVENOUS
  Administered 2019-08-07 (×2): 20 ug/kg/min via INTRAVENOUS
  Filled 2019-08-02 (×21): qty 100

## 2019-08-02 MED ORDER — PHENYLEPHRINE 40 MCG/ML (10ML) SYRINGE FOR IV PUSH (FOR BLOOD PRESSURE SUPPORT)
PREFILLED_SYRINGE | INTRAVENOUS | Status: AC
  Filled 2019-08-02: qty 10

## 2019-08-02 MED ORDER — PROPOFOL 1000 MG/100ML IV EMUL
INTRAVENOUS | Status: AC
  Administered 2019-08-02: 50 ug/kg/min via INTRAVENOUS
  Filled 2019-08-02: qty 100

## 2019-08-02 MED ORDER — ETOMIDATE 2 MG/ML IV SOLN
40.0000 mg | Freq: Once | INTRAVENOUS | Status: AC
  Administered 2019-08-02: 20 mg via INTRAVENOUS

## 2019-08-02 MED ORDER — DEXMEDETOMIDINE HCL IN NACL 400 MCG/100ML IV SOLN
0.4000 ug/kg/h | INTRAVENOUS | Status: DC
  Administered 2019-08-02: 0.6 ug/kg/h via INTRAVENOUS
  Administered 2019-08-02 (×2): 1.2 ug/kg/h via INTRAVENOUS
  Administered 2019-08-02: 1 ug/kg/h via INTRAVENOUS
  Administered 2019-08-03 (×2): 0.9 ug/kg/h via INTRAVENOUS
  Administered 2019-08-03: 0.8 ug/kg/h via INTRAVENOUS
  Administered 2019-08-03: 1 ug/kg/h via INTRAVENOUS
  Administered 2019-08-03: 1.1 ug/kg/h via INTRAVENOUS
  Administered 2019-08-04: 1 ug/kg/h via INTRAVENOUS
  Administered 2019-08-04: 0.9 ug/kg/h via INTRAVENOUS
  Administered 2019-08-04: 0.6 ug/kg/h via INTRAVENOUS
  Administered 2019-08-04: 0.9 ug/kg/h via INTRAVENOUS
  Administered 2019-08-05: 1.2 ug/kg/h via INTRAVENOUS
  Administered 2019-08-05 (×2): 1 ug/kg/h via INTRAVENOUS
  Administered 2019-08-05 – 2019-08-06 (×3): 1.2 ug/kg/h via INTRAVENOUS
  Administered 2019-08-06: 0.9 ug/kg/h via INTRAVENOUS
  Administered 2019-08-06: 1 ug/kg/h via INTRAVENOUS
  Administered 2019-08-06: 1.2 ug/kg/h via INTRAVENOUS
  Administered 2019-08-06: 1 ug/kg/h via INTRAVENOUS
  Administered 2019-08-06: 1.2 ug/kg/h via INTRAVENOUS
  Administered 2019-08-07 – 2019-08-08 (×6): 1 ug/kg/h via INTRAVENOUS
  Administered 2019-08-08: 0.8 ug/kg/h via INTRAVENOUS
  Administered 2019-08-08: 1.2 ug/kg/h via INTRAVENOUS
  Administered 2019-08-08: 0.7 ug/kg/h via INTRAVENOUS
  Administered 2019-08-09: 0.8 ug/kg/h via INTRAVENOUS
  Filled 2019-08-02 (×35): qty 100

## 2019-08-02 MED ORDER — VECURONIUM BROMIDE 10 MG IV SOLR
INTRAVENOUS | Status: AC
  Administered 2019-08-02: 10 mg via INTRAVENOUS
  Filled 2019-08-02: qty 10

## 2019-08-02 NOTE — Progress Notes (Signed)
Pt was extubated and needed to be reintubated about an hour later, pt desated into the 70's while being intubated. Pt brady downed to 20's and compressions were started. Pt hr came back up with continuous bagging after about 20 seconds. Janina Mayo was placed and etomidate and vec were pushed per verbal order from Dr Katrinka Blazing. of phenylephrine push were given per verbal order from dr Tonia Brooms also at bedside along with half an amp of epi.

## 2019-08-02 NOTE — Progress Notes (Signed)
NAME:  George Kelly, MRN:  [id number], DOB:  06/15/1990, LOS: 8 ADMISSION DATE:  07/25/2019, CONSULTATION DATE:  07/25/2019 REFERRING MD:  Dr. Langston Masker, CHIEF COMPLAINT:  Cardiac arrest    Brief History   29 yo male found unresponsive with probable drug overdose.  PEA arrest with ROSC after about 15 minutes.  Found to have lactic acidosis, AKI from rhabdomyolysis, aspiration pneumonia, shock liver, hypothermia.  UDS positive for cocaine, benzo's, THC.       Past Medical History  Substance abuse   Significant Hospital Events   6/09 Admit, start TTM and LTM 6/10 CRRT 6/11 off CRRT, rewarming from TTM 6/12 A fib with RVR >> amiodarone  Consults:  Nephrology signed off 6/12  Procedures:  ETT 6/09 >>  Rt chest tube 6/09 >>  Lt radial a line 6/09 >>  Rt [place] CVL 6/09 >>  Rt femoral HD cath 6/09 >> 6/12  Significant Diagnostic Tests:   CT head 6/09 >> no acute findings  CT chest 6/09 >> 10% Rt PTX, diffuse tree in bud and nodular opacities, debris within b/l mainstem bronchi, mucous plugging lower lobes  CT abd/pelvis 6/09 >> normal  Renal u/s 6/10 >> echogenic kidney b/l  Echo 6/10 >> EF 55 to 60%, grade 2 DD  cth 6/14: No acute intracranial abnormality.  Ct chest abd pelvis 6/14: Extensive bilateral airspace opacities, worsening since prior study. Dense consolidation in both lower lobes with small bilateral effusions. Right chest tube in place. Small residual right anterior pneumothorax. No acute findings in the abdomen or pelvis.  Micro Data:  COVID 6/9 > negative Blood culture 6/9 > lactobacillus Urine culture 6/9 > negative 6/14 blood: ngtd 6/15 resp: enterobacter  Antimicrobials:  Zosyn 6/09 >> 6/14 Vancomycin 6/09 >> 6/10 Zyvox 6/11 linezolid 6/15-> merrem 6/15->  Interim history/subjective:  With sedation wean yesterday became very agitated, not following commands.  Objective   Blood pressure 123/62, pulse 95, temperature 99.1 F (37.3 C), temperature  source Axillary, resp. rate (!) 26, height 6\' 1"  (1.854 m), weight 89.2 kg, SpO2 95 %.    Vent Mode: PCV FiO2 (%):  [40 %-50 %] 40 % Set Rate:  [26 bmp] 26 bmp PEEP:  [8 cmH20] 8 cmH20 Pressure Support:  [14 cmH20] 14 cmH20 Plateau Pressure:  [25 cmH20-29 cmH20] 25 cmH20   Intake/Output Summary (Last 24 hours) at 08/02/2019 0731 Last data filed at 08/02/2019 0700 Gross per 24 hour  Intake 3564.91 ml  Output 3000 ml  Net 564.91 ml   Filed Weights   07/29/19 0329 07/30/19 0500 07/31/19 0500  Weight: 89.4 kg 90.1 kg 89.2 kg    Examination:  GEN: young man on vent HEENT: ETT in place, copious secretions CV: regular, tachycardic, ext warm PULM: Rhonci R>L, doing well on PS GI: Soft, +BS EXT: No edema NEURO: moves all 4 ext but not to command, agitated PSYCH: RASS +1 SKIN: No rashes  Sodium 151>>152 UoP 3 L BM 6/16 Pct 3.9>>3 WBC 15>>19>>20 Cr 2.25>>1.9>>2.1  CXR R>L airspace disease Sugars okay   Resolved/Stable Hospital Problem list   Hyperkalemia, Anion gap metabolic acidosis with lactic acidosis, Hypoglycemia Shock Reactive Afib/RVR Thrombocytopenia Anemia Transaminitis Gastritis  Assessment & Plan:   Acute hypoxic respiratory failure from cardiac arrest and aspiration pneumonia. - SBT/SAT today - May give shot at extubation so we can get idea where his mental status is - SAT 6/16 just resulted in agitation from ETT - If fails extubation we will proceed with tracheostomy to facilitate  vent and sedation weaning  Enterobacter pneumonia -continue meropenem, DC linezolid  Rt pneumothorax after CPR - keep chest tube on -20 cm suction for now until ett removed - f/u CXR periodically  AKI from rhabdomyolysis. Hypokalemia. - renal fx stable - ck improving to <4K - cont to follow uop and renal indices  Hypernatremia:  -continue free water, unclear why only 500 cc free water given yesterday when 2000 was ordered  Transaminitis:  -improved  Acute  metabolic encephalopathy from anoxia, sepsis, renal failure. - need to keep sedation light to allow to flush from system to see where we are - extubation vs. Janina Mayo will facilitate this, see discussion above  Polysubstance abuse. - UDS positive for THC, cocaine and benzo  Best practice:  Diet: tube feeds DVT prophylaxis: lovenox GI prophylaxis: protonix Mobility: Bedrest  Code Status: Full Disposition: ICU  Family updated daily  The patient is critically ill with multiple organ systems failure and requires high complexity decision making for assessment and support, frequent evaluation and titration of therapies, application of advanced monitoring technologies and extensive interpretation of multiple databases. Critical Care Time devoted to patient care services described in this note independent of APP/resident time (if applicable)  is 35 minutes.   Myrla Halsted MD [facility] 08/02/2019 7:31 AM Personal pager: [phone] If unanswered, please page CCM On-call: #[phone]

## 2019-08-02 NOTE — Procedures (Signed)
Intubation Procedure Note George Kelly [id number] 06/15/1990  Student Dr. Treat and Dr. Tamala Julian  Procedure: Intubation Indications: Respiratory insufficiency  Procedure Details Consent: Risks of procedure as well as the alternatives and risks of each were explained to the (patient/caregiver).  Consent for procedure obtained. Time Out: Verified patient identification, verified procedure, site/side was marked, verified correct patient position, special equipment/implants available, medications/allergies/relevent history reviewed, required imaging and test results available.  Performed  Maximum sterile technique was used including antiseptics, cap, gloves, gown, hand hygiene, mask and sheet.  MAC and 4    Evaluation Hemodynamic Status: lost pulse briefly, responded to 0.67m epi; O2 sats: transiently fell during during procedure Patient's Current Condition: stable Complications: No apparent complications Patient did not tolerate procedure well. Chest X-ray ordered to verify placement.  CXR: pending.   George Furbish6/17/2021

## 2019-08-02 NOTE — Procedures (Signed)
Procedure: Percutaneous Tracheostomy CPT 31600 Performed by: Mia Creek, MS4  Bronchoscopy Assistant: Randa Ngo, MD  Indications: Chronic respiratory failure and need for ongoing mechanical ventilation.  Consent: Signed in chart  Preprocedure: Universal protocol was followed for this procedure. Timeout performed. Anterior neck prepped and draped.  Anesthesia: The patient was intubated and sedated prior to the procedure. Additional midazolam, etomidate, fentanyl and vecuronium were given for sedation and paralysis with close attention to vital signs throughout procedure.   Procedure: The patient was placed in the supine position. The anterior neck was prepped and draped in usual sterile fashion. 1% lidocaine was administered approximately 2 fingerbreadths above the sternal notch for local anesthesia. A 1.5-cm vertical incision was then performed 2 fingerbreadths above the sternal notch. Using a curved Kelly, blunt dissection was performed down to the level of the pretracheal fascia. At this point, the bronchoscope was introduced through the endotracheal tube and the trachea was properly visualized. The endotracheal tube was then gradually withdrawn within the trachea under direct bronchoscopic visualization. Proper midline position was confirmed by bouncing the needle from the tracheostomy tray over the trachea with bronchoscopic examination. The needle was advanced into the trachea and proper positioning was confirmed with direct visualization. The needle was then removed leaving a white outer cannula in position. The wire from the tracheostomy tray was then advanced through the white outer cannula. The cannula was then removed. The small, blue dilator was then advanced over the wire into the trachea. Once proper dilatation was achieved, the dilator was removed. The large, tapered dilator was then advanced over the wire into the trachea. The dilator was removed leaving the wire and  white inner cannula in position. A number 8 percutaneous Portex tracheostomy tube was then advanced over the wire and white inner cannula into the trachea. Proper positioning was confirmed with bronchoscopic visualization. The tracheostomy tube was then sutured in place with four nylon sutures. It was further secured with a tracheostomy tie.  Estimated blood loss: Less than 5 mL.  Complications: None immediate.  CXR ordered.   Josephine Igo, DO [facility] 08/02/2019 10:05 AM

## 2019-08-02 NOTE — Progress Notes (Signed)
Given negative neck CT, clinical history, I do not think further C collar warranted.  Myrla Halsted MD PCCM

## 2019-08-02 NOTE — Procedures (Signed)
Extubation Procedure Note  Patient Details:   Name: Billyjoe Go DOB: 06/15/1990 MRN: [id number]   Airway Documentation:    Vent end date: 08/02/19 Vent end time: 0825   Evaluation  O2 sats: transiently fell during during procedure Complications: No apparent complications Patient did tolerate procedure well. Bilateral BS- Rhonchi  Patient extubated to 6L [place]. SpO2 fell to 84%, placed on NRB mask. Patient is not following commands. BS- Rhonchi & patient won't cough. Labored breathing currently, MD aware.      Jacqulynn Cadet 08/02/2019, 8:34 AM

## 2019-08-02 NOTE — Procedures (Signed)
Bronchoscopy  Indication: Tracheostomy placement, secretion clearance issues  Consent: Signed in chart  Anesthesia: See trach note  Procedure - Timeout performed - Bronchoscope advanced through ETT - Airways examined down to subsegmental level - Following airway examination, direct visualization of tracheostomy into tracheal lumen by Dr. Tonia Brooms was provided - After tracheostomy placement, therapeutic suctioning of secretions throughout both bronchi was performed  Findings - Copious mucus and mucus plugs found throughout all bronchi, suctioned - Appropriate position of tracheostomy tube above carina  Specimen(s): None  Complications: None immediate

## 2019-08-02 NOTE — Procedures (Signed)
Bedside Tracheostomy Insertion Procedure Note   Patient Details:   Name: George Kelly DOB: 06/15/1990 MRN: [id number]  Procedure: Tracheostomy  Pre Procedure Assessment: ET Tube Size: 7.5 ET Tube secured at lip (cm): 25 Bite block in place: No Breath Sounds: Rhonch  Post Procedure Assessment: BP (!) 183/91    Pulse (!) 128    Temp 99.1 F (37.3 C) (Axillary)    Resp (!) 26    Ht 6\' 1"  (1.854 m)    Wt 89.2 kg    SpO2 95%    BMI 25.94 kg/m  O2 sats: stable throughout Complications: No apparent complications Patient did tolerate procedure well Tracheostomy Brand:Portex Tracheostomy Style:Cuffed Tracheostomy Size: 8.0 Tracheostomy Secured , velcro Tracheostomy Placement Confirmation:Trach cuff visualized and in place and Chest X ray ordered for placement    PZW:CHENIDP 08/02/2019, 10:18 AM

## 2019-08-03 ENCOUNTER — Inpatient Hospital Stay (HOSPITAL_COMMUNITY): Payer: Self-pay

## 2019-08-03 LAB — COMPREHENSIVE METABOLIC PANEL
ALT: 75 U/L — ABNORMAL HIGH (ref 0–44)
AST: 71 U/L — ABNORMAL HIGH (ref 15–41)
Albumin: 1.8 g/dL — ABNORMAL LOW (ref 3.5–5.0)
Alkaline Phosphatase: 138 U/L — ABNORMAL HIGH (ref 38–126)
Anion gap: 10 (ref 5–15)
BUN: 35 mg/dL — ABNORMAL HIGH (ref 6–20)
CO2: 24 mmol/L (ref 22–32)
Calcium: 8.8 mg/dL — ABNORMAL LOW (ref 8.9–10.3)
Chloride: 115 mmol/L — ABNORMAL HIGH (ref 98–111)
Creatinine, Ser: 1.89 mg/dL — ABNORMAL HIGH (ref 0.61–1.24)
GFR calc Af Amer: 55 mL/min — ABNORMAL LOW (ref >60)
GFR calc non Af Amer: 47 mL/min — ABNORMAL LOW (ref >60)
Glucose, Bld: 88 mg/dL (ref 70–99)
Potassium: 2.6 mmol/L — CL (ref 3.5–5.1)
Sodium: 149 mmol/L — ABNORMAL HIGH (ref 135–145)
Total Bilirubin: 1.2 mg/dL (ref 0.3–1.2)
Total Protein: 6.1 g/dL — ABNORMAL LOW (ref 6.5–8.1)

## 2019-08-03 LAB — GLUCOSE, CAPILLARY
Glucose-Capillary: 78 mg/dL (ref 70–99)
Glucose-Capillary: 85 mg/dL (ref 70–99)
Glucose-Capillary: 86 mg/dL (ref 70–99)
Glucose-Capillary: 91 mg/dL (ref 70–99)
Glucose-Capillary: 96 mg/dL (ref 70–99)
Glucose-Capillary: 99 mg/dL (ref 70–99)

## 2019-08-03 LAB — CBC
HCT: 32.6 % — ABNORMAL LOW (ref 39.0–52.0)
Hemoglobin: 10.8 g/dL — ABNORMAL LOW (ref 13.0–17.0)
MCH: 28.6 pg (ref 26.0–34.0)
MCHC: 33.1 g/dL (ref 30.0–36.0)
MCV: 86.2 fL (ref 80.0–100.0)
Platelets: 434 10*3/uL — ABNORMAL HIGH (ref 150–400)
RBC: 3.78 MIL/uL — ABNORMAL LOW (ref 4.22–5.81)
RDW: 15.7 % — ABNORMAL HIGH (ref 11.5–15.5)
WBC: 18 10*3/uL — ABNORMAL HIGH (ref 4.0–10.5)
nRBC: 0 % (ref 0.0–0.2)

## 2019-08-03 LAB — MAGNESIUM: Magnesium: 1.9 mg/dL (ref 1.7–2.4)

## 2019-08-03 LAB — BASIC METABOLIC PANEL
Anion gap: 11 (ref 5–15)
BUN: 37 mg/dL — ABNORMAL HIGH (ref 6–20)
CO2: 22 mmol/L (ref 22–32)
Calcium: 8.3 mg/dL — ABNORMAL LOW (ref 8.9–10.3)
Chloride: 115 mmol/L — ABNORMAL HIGH (ref 98–111)
Creatinine, Ser: 1.75 mg/dL — ABNORMAL HIGH (ref 0.61–1.24)
GFR calc Af Amer: >60 mL/min (ref >60)
GFR calc non Af Amer: 52 mL/min — ABNORMAL LOW (ref >60)
Glucose, Bld: 92 mg/dL (ref 70–99)
Potassium: 3.3 mmol/L — ABNORMAL LOW (ref 3.5–5.1)
Sodium: 148 mmol/L — ABNORMAL HIGH (ref 135–145)

## 2019-08-03 LAB — PHOSPHORUS: Phosphorus: 1.8 mg/dL — ABNORMAL LOW (ref 2.5–4.6)

## 2019-08-03 MED ORDER — POTASSIUM CHLORIDE 10 MEQ/50ML IV SOLN
10.0000 meq | INTRAVENOUS | Status: AC
  Administered 2019-08-03 (×3): 10 meq via INTRAVENOUS
  Filled 2019-08-03 (×3): qty 50

## 2019-08-03 MED ORDER — OXYCODONE HCL 5 MG PO TABS
10.0000 mg | ORAL_TABLET | ORAL | Status: DC
  Administered 2019-08-03 – 2019-08-04 (×4): 10 mg
  Filled 2019-08-03 (×4): qty 2

## 2019-08-03 MED ORDER — PRO-STAT SUGAR FREE PO LIQD
30.0000 mL | Freq: Three times a day (TID) | ORAL | Status: DC
  Administered 2019-08-04 – 2019-08-15 (×34): 30 mL
  Filled 2019-08-03 (×34): qty 30

## 2019-08-03 MED ORDER — CLONAZEPAM 1 MG PO TABS
2.0000 mg | ORAL_TABLET | Freq: Two times a day (BID) | ORAL | Status: DC
  Administered 2019-08-04: 2 mg
  Filled 2019-08-03: qty 2

## 2019-08-03 MED ORDER — QUETIAPINE FUMARATE 50 MG PO TABS
50.0000 mg | ORAL_TABLET | Freq: Two times a day (BID) | ORAL | Status: DC
  Administered 2019-08-04: 50 mg
  Filled 2019-08-03: qty 1

## 2019-08-03 MED ORDER — POTASSIUM PHOSPHATES 15 MMOLE/5ML IV SOLN
30.0000 mmol | Freq: Once | INTRAVENOUS | Status: AC
  Administered 2019-08-03: 30 mmol via INTRAVENOUS
  Filled 2019-08-03: qty 10

## 2019-08-03 MED ORDER — CLONIDINE HCL 0.2 MG PO TABS
0.2000 mg | ORAL_TABLET | Freq: Four times a day (QID) | ORAL | Status: DC
  Administered 2019-08-04: 0.2 mg via ORAL
  Filled 2019-08-03 (×2): qty 1

## 2019-08-03 MED ORDER — OXYCODONE HCL 5 MG PO TABS
10.0000 mg | ORAL_TABLET | ORAL | Status: DC
  Administered 2019-08-03 (×3): 10 mg via ORAL
  Filled 2019-08-03 (×3): qty 2

## 2019-08-03 MED ORDER — QUETIAPINE FUMARATE 50 MG PO TABS
50.0000 mg | ORAL_TABLET | Freq: Two times a day (BID) | ORAL | Status: DC
  Administered 2019-08-03 (×2): 50 mg via ORAL
  Filled 2019-08-03 (×2): qty 1

## 2019-08-03 MED ORDER — CLONIDINE HCL 0.1 MG PO TABS: 0.1000 mg | ORAL_TABLET | Freq: Four times a day (QID) | ORAL | Status: DC

## 2019-08-03 MED ORDER — MAGNESIUM SULFATE 2 GM/50ML IV SOLN
2.0000 g | Freq: Once | INTRAVENOUS | Status: AC
  Administered 2019-08-03: 2 g via INTRAVENOUS
  Filled 2019-08-03: qty 50

## 2019-08-03 MED ORDER — CLONAZEPAM 1 MG PO TABS
2.0000 mg | ORAL_TABLET | Freq: Two times a day (BID) | ORAL | Status: DC
  Administered 2019-08-03 (×2): 2 mg via ORAL
  Filled 2019-08-03 (×2): qty 2

## 2019-08-03 MED ORDER — DEXTROSE 5 % IV SOLN
500.0000 mg | Freq: Once | INTRAVENOUS | Status: AC
  Administered 2019-08-03: 500 mg via INTRAVENOUS
  Filled 2019-08-03: qty 500

## 2019-08-03 MED ORDER — ROCURONIUM BROMIDE 10 MG/ML (PF) SYRINGE
PREFILLED_SYRINGE | INTRAVENOUS | Status: AC
  Administered 2019-08-03: 100 mg
  Filled 2019-08-03: qty 10

## 2019-08-03 NOTE — Procedures (Signed)
Tracheostomy exchange through immature stoma. Indication: existing tracheostomy we do not have inner cannulas for Already sedated heavily with existing drips. Rocuronium 100mg  given. Patient disconnected from vent. Wire advanced down existing 8-0 portex Portex removed keeping wire in place New 8-0 shiley, preloaded on dilator, advanced over wire and into trachea. Placed back on vent. Good TV returns on vent. No desaturations  MD PCCM

## 2019-08-03 NOTE — Progress Notes (Signed)
SLP Cancellation Note  Patient Details Name: Denman Pichardo MRN: [id number] DOB: 06/15/1990   Cancelled treatment:       Reason Eval/Treat Not Completed: Patient not medically ready. Patient with new tracheostomy. Orders for SLP eval and treat for PMSV and swallowing received. Will follow pt closely for readiness for SLP interventions as appropriate.     Amory Zbikowski, Riley Nearing 08/03/2019, 8:35 AM

## 2019-08-03 NOTE — Progress Notes (Signed)
K 2.6, Mg 1.9, Phos 1.8 Electrolytes replaced per protocol

## 2019-08-03 NOTE — Procedures (Signed)
Cortrak  Person Inserting Tube:  Gean Larose, Verdon Cummins, RD Tube Type:  Cortrak - 43 inches Tube Location:  Right nare Initial Placement:  Stomach Secured by: Bridle Technique Used to Measure Tube Placement:  Documented cm marking at nare/ corner of mouth Cortrak Secured At:  74 cm    Cortrak Tube Team Note:  Consult received to place a Cortrak feeding tube.   No x-ray is required. RN may begin using tube.    If the tube becomes dislodged please keep the tube and contact the Cortrak team at [email or web address] (password [email or web address]) for replacement.  If after hours and replacement cannot be delayed, place a NG tube and confirm placement with an abdominal x-ray.    Eugene Gavia, MS, RD, LDN RD pager number and weekend/on-call pager number located in [place].

## 2019-08-03 NOTE — Progress Notes (Signed)
OT Cancellation Note  Patient Details Name: George Kelly MRN: [id number] DOB: 06/15/1990   Cancelled Treatment:    Reason Eval/Treat Not Completed: Patient not medically ready (Per RN pt is not yet medically ready to proceed with OT POC. Pt getting sized for new trach this date. Will continue to follow as available and appropriate to proceed with POC)  Dalphine Handing, MSOT, OTR/L Acute Rehabilitation Services [facility] Office Number: [phone] Pager: [phone]  Dalphine Handing 08/03/2019, 1:44 PM

## 2019-08-03 NOTE — Progress Notes (Signed)
PT Cancellation Note  Patient Details Name: George Kelly MRN: [id number] DOB: 06/15/1990   Cancelled Treatment:    Reason Eval/Treat Not Completed: Patient not medically ready.  RN held evaluation, pt sedated and unable to participate. 08/03/2019  Jacinto Halim., PT Acute Rehabilitation Services [phone]  (pager) [phone]  (office)08/03/2019     George Kelly 08/03/2019, 4:40 PM

## 2019-08-03 NOTE — Progress Notes (Signed)
NAME:  George Kelly, MRN:  [id number], DOB:  06/15/1990, LOS: 9 ADMISSION DATE:  07/25/2019, CONSULTATION DATE:  07/25/2019 REFERRING MD:  Dr. Renaye Rakers, CHIEF COMPLAINT:  Cardiac arrest    Brief History   29 yo male found unresponsive with probable drug overdose.  PEA arrest with ROSC after about 15 minutes.  Found to have lactic acidosis, AKI from rhabdomyolysis, aspiration pneumonia, shock liver, hypothermia.  UDS positive for cocaine, benzo's, THC.       Past Medical History  Substance abuse   Significant Hospital Events   6/09 Admit, start TTM and LTM 6/10 CRRT 6/11 off CRRT, rewarming from TTM 6/12 A fib with RVR >> amiodarone  Consults:  Nephrology signed off 6/12  Procedures:  ETT 6/09 >>  Rt chest tube 6/09 >>  Lt radial a line 6/09 >>  Rt [place] CVL 6/09 >>  Rt femoral HD cath 6/09 >> 6/12  Significant Diagnostic Tests:   CT head 6/09 >> no acute findings  CT chest 6/09 >> 10% Rt PTX, diffuse tree in bud and nodular opacities, debris within b/l mainstem bronchi, mucous plugging lower lobes  CT abd/pelvis 6/09 >> normal  Renal u/s 6/10 >> echogenic kidney b/l  Echo 6/10 >> EF 55 to 60%, grade 2 DD  cth 6/14: No acute intracranial abnormality.  Ct chest abd pelvis 6/14: Extensive bilateral airspace opacities, worsening since prior study. Dense consolidation in both lower lobes with small bilateral effusions. Right chest tube in place. Small residual right anterior pneumothorax. No acute findings in the abdomen or pelvis.  Micro Data:  COVID 6/9 > negative Blood culture 6/9 > lactobacillus Urine culture 6/9 > negative 6/14 blood: ngtd 6/15 resp: enterobacter  Antimicrobials:  Zosyn 6/09 >> 6/14 Vancomycin 6/09 >> 6/10 Zyvox 6/11 linezolid 6/15-> merrem 6/15->  Interim history/subjective:  Failed extubation trial yesterday and had tracheostomy.  Post trach now intermittently following commands, mouthing words but extremely agitated.  Objective   Blood  pressure 130/79, pulse 69, temperature 98.1 F (36.7 C), temperature source Oral, resp. rate (!) 26, height 6\' 1"  (1.854 m), weight 81.6 kg, SpO2 100 %.    Vent Mode: PCV FiO2 (%):  [40 %-60 %] 40 % Set Rate:  [26 bmp] 26 bmp PEEP:  [5 cmH20-8 cmH20] 5 cmH20 Plateau Pressure:  [18 cmH20-28 cmH20] 18 cmH20   Intake/Output Summary (Last 24 hours) at 08/03/2019 0737 Last data filed at 08/03/2019 0600 Gross per 24 hour  Intake 1859.23 ml  Output 5330 ml  Net -3470.77 ml   Filed Weights   07/30/19 0500 07/31/19 0500 08/03/19 0140  Weight: 90.1 kg 89.2 kg 81.6 kg    Examination:  GEN: young man on vent HEENT: trach in place, copious secretions CV: regular, tachycardic, ext warm PULM: Rhonci R>L, doing well on PS GI: Soft, +BS EXT: No edema NEURO: moves all 4 ext but not to command, agitated PSYCH: RASS +1 SKIN: No rashes  Sodium 152>>149 Net neg 3.5L yesterday Pct 3.9>>3 Tg 191 WBC 15>>19>>20>>18 Cr 2.25>>1.9>>2.1 >>1.9  CXR R>L airspace disease, trach in good position Sugars okay   Resolved/Stable Hospital Problem list   Hyperkalemia, Anion gap metabolic acidosis with lactic acidosis, Hypoglycemia Shock Reactive Afib/RVR Thrombocytopenia Anemia Transaminitis Gastritis  Assessment & Plan:   Acute hypoxic respiratory failure from cardiac arrest and aspiration pneumonia. - s/p trach 6/17 due to delirium/encephalopathy and secretion clearance issues - work on weaning sedation today to PO: clonidine/seroquel/oxycodone/clonazepam added, wean off benzo/propofol/dilaudid/precedex; goal will be dilaudid/precedex + oral  meds - change out trach today (we do not carry portex trachs) then PS trials - PT/OT/SLP consults appreciated - I am hopeful we can wean him from trach  Enterobacter pneumonia -continue meropenem x 7 days, DC linezolid  Rt pneumothorax after CPR - clamp - f/u CXR periodically  AKI from rhabdomyolysis. Hypokalemia. - renal fx stable - ck improving  to <4K - cont to follow uop and renal indices  Hypernatremia:  -continue aggressive free water supplementation, add another dose of diuril  Acute metabolic encephalopathy from anoxia, sepsis, renal failure. - see discussion in resp failure  Polysubstance abuse. - UDS positive for THC, cocaine and benzo; makes finding happy sedation medium a bit difficult  Best practice:  Diet: tube feeds DVT prophylaxis: lovenox GI prophylaxis: protonix Mobility: up to chair Code Status: Full Disposition: ICU  Family updated daily  The patient is critically ill with multiple organ systems failure and requires high complexity decision making for assessment and support, frequent evaluation and titration of therapies, application of advanced monitoring technologies and extensive interpretation of multiple databases. Critical Care Time devoted to patient care services described in this note independent of APP/resident time (if applicable)  is 33 minutes.   Erskine Emery MD [facility] 08/03/2019 7:37 AM Personal pager: [phone] If unanswered, please page CCM On-call: [phone]

## 2019-08-04 ENCOUNTER — Inpatient Hospital Stay (HOSPITAL_COMMUNITY): Payer: Self-pay

## 2019-08-04 LAB — CULTURE, BLOOD (ROUTINE X 2)
Culture: NO GROWTH
Culture: NO GROWTH
Special Requests: ADEQUATE
Special Requests: ADEQUATE

## 2019-08-04 LAB — GLUCOSE, CAPILLARY
Glucose-Capillary: 136 mg/dL — ABNORMAL HIGH (ref 70–99)
Glucose-Capillary: 104 mg/dL — ABNORMAL HIGH (ref 70–99)
Glucose-Capillary: 98 mg/dL (ref 70–99)
Glucose-Capillary: 98 mg/dL (ref 70–99)
Glucose-Capillary: 104 mg/dL — ABNORMAL HIGH (ref 70–99)
Glucose-Capillary: 98 mg/dL (ref 70–99)

## 2019-08-04 LAB — CBC
HCT: 30.2 % — ABNORMAL LOW (ref 39.0–52.0)
Hemoglobin: 10 g/dL — ABNORMAL LOW (ref 13.0–17.0)
MCH: 28.8 pg (ref 26.0–34.0)
MCHC: 33.1 g/dL (ref 30.0–36.0)
MCV: 87 fL (ref 80.0–100.0)
Platelets: 443 10*3/uL — ABNORMAL HIGH (ref 150–400)
RBC: 3.47 MIL/uL — ABNORMAL LOW (ref 4.22–5.81)
RDW: 16.2 % — ABNORMAL HIGH (ref 11.5–15.5)
WBC: 14 10*3/uL — ABNORMAL HIGH (ref 4.0–10.5)
nRBC: 0 % (ref 0.0–0.2)

## 2019-08-04 LAB — COMPREHENSIVE METABOLIC PANEL
ALT: 78 U/L — ABNORMAL HIGH (ref 0–44)
AST: 104 U/L — ABNORMAL HIGH (ref 15–41)
Albumin: 1.6 g/dL — ABNORMAL LOW (ref 3.5–5.0)
Alkaline Phosphatase: 156 U/L — ABNORMAL HIGH (ref 38–126)
Anion gap: 9 (ref 5–15)
BUN: 36 mg/dL — ABNORMAL HIGH (ref 6–20)
CO2: 23 mmol/L (ref 22–32)
Calcium: 7.8 mg/dL — ABNORMAL LOW (ref 8.9–10.3)
Chloride: 114 mmol/L — ABNORMAL HIGH (ref 98–111)
Creatinine, Ser: 1.82 mg/dL — ABNORMAL HIGH (ref 0.61–1.24)
GFR calc Af Amer: 57 mL/min — ABNORMAL LOW (ref >60)
GFR calc non Af Amer: 49 mL/min — ABNORMAL LOW (ref >60)
Glucose, Bld: 135 mg/dL — ABNORMAL HIGH (ref 70–99)
Potassium: 3.4 mmol/L — ABNORMAL LOW (ref 3.5–5.1)
Sodium: 146 mmol/L — ABNORMAL HIGH (ref 135–145)
Total Bilirubin: 0.6 mg/dL (ref 0.3–1.2)
Total Protein: 5.4 g/dL — ABNORMAL LOW (ref 6.5–8.1)

## 2019-08-04 LAB — PHOSPHORUS: Phosphorus: 7.6 mg/dL — ABNORMAL HIGH (ref 2.5–4.6)

## 2019-08-04 LAB — MAGNESIUM: Magnesium: 1.9 mg/dL (ref 1.7–2.4)

## 2019-08-04 MED ORDER — MAGNESIUM SULFATE 2 GM/50ML IV SOLN
2.0000 g | Freq: Once | INTRAVENOUS | Status: AC
  Administered 2019-08-04: 2 g via INTRAVENOUS
  Filled 2019-08-04: qty 50

## 2019-08-04 MED ORDER — PROPRANOLOL HCL 20 MG/5ML PO SOLN
40.0000 mg | Freq: Three times a day (TID) | ORAL | Status: DC
  Administered 2019-08-04 – 2019-08-30 (×77): 40 mg
  Filled 2019-08-04 (×82): qty 10

## 2019-08-04 MED ORDER — CLONIDINE HCL 0.1 MG PO TABS
0.1000 mg | ORAL_TABLET | Freq: Four times a day (QID) | ORAL | Status: DC
  Administered 2019-08-05 – 2019-08-07 (×9): 0.1 mg
  Filled 2019-08-04 (×9): qty 1

## 2019-08-04 MED ORDER — OXYCODONE HCL 5 MG PO TABS
20.0000 mg | ORAL_TABLET | ORAL | Status: DC
  Administered 2019-08-04 – 2019-08-05 (×6): 20 mg
  Filled 2019-08-04 (×6): qty 4

## 2019-08-04 MED ORDER — CLONIDINE HCL 0.1 MG PO TABS: 0.1000 mg | ORAL_TABLET | Freq: Four times a day (QID) | ORAL | Status: DC

## 2019-08-04 MED ORDER — CLONAZEPAM 1 MG PO TABS: 2.0000 mg | ORAL_TABLET | Freq: Three times a day (TID) | ORAL | Status: DC

## 2019-08-04 MED ORDER — QUETIAPINE FUMARATE 100 MG PO TABS
100.0000 mg | ORAL_TABLET | Freq: Two times a day (BID) | ORAL | Status: DC
  Administered 2019-08-04 – 2019-08-10 (×12): 100 mg
  Filled 2019-08-04 (×12): qty 1

## 2019-08-04 MED ORDER — POTASSIUM CHLORIDE 20 MEQ/15ML (10%) PO SOLN
40.0000 meq | Freq: Once | ORAL | Status: AC
  Administered 2019-08-04: 40 meq
  Filled 2019-08-04: qty 30

## 2019-08-04 MED ORDER — CLONAZEPAM 1 MG PO TABS: 4.0000 mg | ORAL_TABLET | Freq: Two times a day (BID) | ORAL | Status: DC

## 2019-08-04 MED ORDER — CLONIDINE HCL 0.2 MG PO TABS
0.2000 mg | ORAL_TABLET | Freq: Four times a day (QID) | ORAL | Status: DC
  Administered 2019-08-04: 0.2 mg
  Filled 2019-08-04: qty 1

## 2019-08-04 MED ORDER — CLONIDINE HCL 0.2 MG PO TABS
0.2000 mg | ORAL_TABLET | Freq: Four times a day (QID) | ORAL | Status: AC
  Administered 2019-08-04 – 2019-08-05 (×2): 0.2 mg
  Filled 2019-08-04 (×2): qty 1

## 2019-08-04 MED ORDER — CLONAZEPAM 1 MG PO TABS
2.0000 mg | ORAL_TABLET | Freq: Three times a day (TID) | ORAL | Status: DC
  Administered 2019-08-04 – 2019-08-05 (×3): 2 mg
  Filled 2019-08-04 (×3): qty 2

## 2019-08-04 NOTE — Progress Notes (Signed)
PT Cancellation Note  Patient Details Name: Shaheen Mende MRN: [id number] DOB: 06/15/1990   Cancelled Treatment:    Reason Eval/Treat Not Completed: Patient not medically ready, remains sedated. Per RN, likely to wean sedation tomorrow. Will follow-up for PT Evaluation tomorrow.  Ina Homes, PT, DPT Acute Rehabilitation Services  Pager [phone] Office [phone]  Malachy Chamber 08/04/2019, 8:43 AM

## 2019-08-04 NOTE — Progress Notes (Signed)
OT Cancellation Note  Patient Details Name: Akshith Moncus MRN: [id number] DOB: 06/15/1990   Cancelled Treatment:    Reason Eval/Treat Not Completed: Patient not medically ready (Per RN, likely to wean sedation tomorrow. Will follow as available and appropriate to initiate OT POC)  Dalphine Handing, MSOT, OTR/L Acute Rehabilitation Services [facility] Office Number: [phone] Pager: [phone]  Dalphine Handing 08/04/2019, 10:34 AM

## 2019-08-04 NOTE — Progress Notes (Signed)
NAME:  George Kelly, MRN:  [id number], DOB:  06/15/1990, LOS: 26 ADMISSION DATE:  07/25/2019, CONSULTATION DATE:  07/25/2019 REFERRING MD:  Dr. Langston Masker, CHIEF COMPLAINT:  Cardiac arrest    Brief History   29 yo male found unresponsive with probable drug overdose.  PEA arrest with ROSC after about 15 minutes.  Found to have lactic acidosis, AKI from rhabdomyolysis, aspiration pneumonia, shock liver, hypothermia.  UDS positive for cocaine, benzo's, THC.       Past Medical History  Substance abuse   Significant Hospital Events   6/09 Admit, start TTM and LTM 6/10 CRRT 6/11 off CRRT, rewarming from TTM 6/12 A fib with RVR >> amiodarone  Consults:  Nephrology signed off 6/12  Procedures:  ETT 6/09 >>  Rt chest tube 6/09 >>  Lt radial a line 6/09 >>  Rt Staples CVL 6/09 >>  Rt femoral HD cath 6/09 >> 6/12  Significant Diagnostic Tests:   CT head 6/09 >> no acute findings  CT chest 6/09 >> 10% Rt PTX, diffuse tree in bud and nodular opacities, debris within b/l mainstem bronchi, mucous plugging lower lobes  CT abd/pelvis 6/09 >> normal  Renal u/s 6/10 >> echogenic kidney b/l  Echo 6/10 >> EF 55 to 60%, grade 2 DD  cth 6/14: No acute intracranial abnormality.  Ct chest abd pelvis 6/14: Extensive bilateral airspace opacities, worsening since prior study. Dense consolidation in both lower lobes with small bilateral effusions. Right chest tube in place. Small residual right anterior pneumothorax. No acute findings in the abdomen or pelvis.  Micro Data:  COVID 6/9 > negative Blood culture 6/9 > lactobacillus Urine culture 6/9 > negative 6/14 blood: ngtd 6/15 resp: enterobacter  Antimicrobials:  Zosyn 6/09 >> 6/14 Vancomycin 6/09 >> 6/10 Zyvox 6/11 linezolid 6/15-> merrem 6/15->  Interim history/subjective:  Tracheostomy in place.  Periods of extreme agitation.  Transitioning to enteral medication.  Now off fentanyl infusion.  Objective   Blood pressure 127/65, pulse  96, temperature (!) 100.7 F (38.2 C), temperature source Oral, resp. rate (!) 28, height 6\' 1"  (1.854 m), weight 82.3 kg, SpO2 100 %.    Vent Mode: PCV FiO2 (%):  [40 %] 40 % Set Rate:  [26 bmp-59 bmp] 26 bmp PEEP:  [5 cmH20] 5 cmH20 Plateau Pressure:  [20 cmH20-22 cmH20] 20 cmH20   Intake/Output Summary (Last 24 hours) at 08/04/2019 1448 Last data filed at 08/04/2019 1400 Gross per 24 hour  Intake 5419.38 ml  Output 4975 ml  Net 444.38 ml   Filed Weights   07/31/19 0500 08/03/19 0140 08/04/19 0214  Weight: 89.2 kg 81.6 kg 82.3 kg    Examination:  GEN: young man on vent HEENT: trach in place, copious secretions CV: regular, tachycardic, ext warm PULM: Rhonci R>L, doing well on PS GI: Soft, +BS EXT: No edema NEURO: moves all 4 ext but not to command, agitated PSYCH: RASS +1 SKIN: No rashes   Resolved/Stable Hospital Problem list   Hyperkalemia, Anion gap metabolic acidosis with lactic acidosis, Hypoglycemia Shock Reactive Afib/RVR Thrombocytopenia Anemia Transaminitis Gastritis  Assessment & Plan:   Critically ill due to acute hypoxic respiratory failure from cardiac arrest and aspiration pneumonia. - s/p trach 6/17 due to delirium/encephalopathy and secretion clearance issues -Daily pressure support and trach collar trials - PT/OT/SLP consults appreciated - I am hopeful we can wean him from trach  Enterobacter pneumonia -continue meropenem x 7 days, DC linezolid  Rt pneumothorax after CPR no recurrence since chest tube removed. -  f/u CXR periodically  AKI from rhabdomyolysis. Hypokalemia. - renal fx stable - ck improving to <4K - cont to follow uop and renal indices  Hypernatremia:  -continue aggressive free water supplementation.  Acute metabolic encephalopathy from anoxia, sepsis, renal failure. -Intensify enteral sedative regimen  Polysubstance abuse. - UDS positive for THC, cocaine and benzo; makes finding happy sedation medium a bit  difficult  Best practice:  Diet: tube feeds DVT prophylaxis: lovenox GI prophylaxis: protonix Mobility: up to chair Code Status: Full Disposition: ICU  Family updated daily  CRITICAL CARE Performed by: Lynnell Catalan   Total critical care time: 34 minutes  Critical care time was exclusive of separately billable procedures and treating other patients.  Critical care was necessary to treat or prevent imminent or life-threatening deterioration.  Critical care was time spent personally by me on the following activities: development of treatment plan with patient and/or surrogate as well as nursing, discussions with consultants, evaluation of patient's response to treatment, examination of patient, obtaining history from patient or surrogate, ordering and performing treatments and interventions, ordering and review of laboratory studies, ordering and review of radiographic studies, pulse oximetry, re-evaluation of patient's condition and participation in multidisciplinary rounds.  Lynnell Catalan, MD [facility] ICU Physician [facility] [place] [facility]  Pager: [phone] Mobile: [phone] After hours: [phone].

## 2019-08-04 NOTE — Progress Notes (Signed)
K 3.4, Mg 1.9 Electrolytes replaced per protocol 

## 2019-08-05 LAB — BASIC METABOLIC PANEL
Anion gap: 8 (ref 5–15)
BUN: 24 mg/dL — ABNORMAL HIGH (ref 6–20)
CO2: 22 mmol/L (ref 22–32)
Calcium: 8.2 mg/dL — ABNORMAL LOW (ref 8.9–10.3)
Chloride: 114 mmol/L — ABNORMAL HIGH (ref 98–111)
Creatinine, Ser: 1.51 mg/dL — ABNORMAL HIGH (ref 0.61–1.24)
GFR calc Af Amer: >60 mL/min (ref >60)
GFR calc non Af Amer: >60 mL/min (ref >60)
Glucose, Bld: 131 mg/dL — ABNORMAL HIGH (ref 70–99)
Potassium: 3.3 mmol/L — ABNORMAL LOW (ref 3.5–5.1)
Sodium: 144 mmol/L (ref 135–145)

## 2019-08-05 LAB — GLUCOSE, CAPILLARY
Glucose-Capillary: 133 mg/dL — ABNORMAL HIGH (ref 70–99)
Glucose-Capillary: 85 mg/dL (ref 70–99)
Glucose-Capillary: 102 mg/dL — ABNORMAL HIGH (ref 70–99)
Glucose-Capillary: 112 mg/dL — ABNORMAL HIGH (ref 70–99)
Glucose-Capillary: 107 mg/dL — ABNORMAL HIGH (ref 70–99)

## 2019-08-05 LAB — CBC
HCT: 32.2 % — ABNORMAL LOW (ref 39.0–52.0)
Hemoglobin: 10.5 g/dL — ABNORMAL LOW (ref 13.0–17.0)
MCH: 28.6 pg (ref 26.0–34.0)
MCHC: 32.6 g/dL (ref 30.0–36.0)
MCV: 87.7 fL (ref 80.0–100.0)
Platelets: 461 10*3/uL — ABNORMAL HIGH (ref 150–400)
RBC: 3.67 MIL/uL — ABNORMAL LOW (ref 4.22–5.81)
RDW: 16.3 % — ABNORMAL HIGH (ref 11.5–15.5)
WBC: 13.4 10*3/uL — ABNORMAL HIGH (ref 4.0–10.5)
nRBC: 0 % (ref 0.0–0.2)

## 2019-08-05 LAB — TRIGLYCERIDES: Triglycerides: 141 mg/dL (ref <150)

## 2019-08-05 MED ORDER — CLONAZEPAM 1 MG PO TABS
4.0000 mg | ORAL_TABLET | Freq: Three times a day (TID) | ORAL | Status: DC
  Administered 2019-08-05 – 2019-08-13 (×24): 4 mg
  Filled 2019-08-05 (×25): qty 4

## 2019-08-05 MED ORDER — POTASSIUM CHLORIDE CRYS ER 20 MEQ PO TBCR
20.0000 meq | EXTENDED_RELEASE_TABLET | ORAL | Status: DC
  Filled 2019-08-05: qty 1

## 2019-08-05 MED ORDER — POTASSIUM CHLORIDE 20 MEQ/15ML (10%) PO SOLN
20.0000 meq | ORAL | Status: AC
  Administered 2019-08-05 (×2): 20 meq
  Filled 2019-08-05 (×2): qty 15

## 2019-08-05 MED ORDER — OXYCODONE HCL 5 MG PO TABS
40.0000 mg | ORAL_TABLET | ORAL | Status: DC
  Administered 2019-08-05 – 2019-08-17 (×70): 40 mg
  Filled 2019-08-05 (×71): qty 8

## 2019-08-05 MED ORDER — POTASSIUM CHLORIDE 10 MEQ/50ML IV SOLN
10.0000 meq | INTRAVENOUS | Status: AC
  Administered 2019-08-05 (×4): 10 meq via INTRAVENOUS
  Filled 2019-08-05 (×4): qty 50

## 2019-08-05 NOTE — Progress Notes (Signed)
NAME:  George Kelly, MRN:  [id number], DOB:  06/15/1990, LOS: 11 ADMISSION DATE:  07/25/2019, CONSULTATION DATE:  07/25/2019 REFERRING MD:  Dr. Renaye Rakers, CHIEF COMPLAINT:  Cardiac arrest    Brief History   29 yo male found unresponsive with probable drug overdose.  PEA arrest with ROSC after about 15 minutes.  Found to have lactic acidosis, AKI from rhabdomyolysis, aspiration pneumonia, shock liver, hypothermia.  UDS positive for cocaine, benzo's, THC.       Past Medical History  Substance abuse   Significant Hospital Events   6/09 Admit, start TTM and LTM 6/10 CRRT 6/11 off CRRT, rewarming from TTM 6/12 A fib with RVR >> amiodarone  Consults:  Nephrology signed off 6/12  Procedures:  ETT 6/09 >>  Rt chest tube 6/09 >>  Lt radial a line 6/09 >>  Rt [place] CVL 6/09 >>  Rt femoral HD cath 6/09 >> 6/12  Significant Diagnostic Tests:   CT head 6/09 >> no acute findings  CT chest 6/09 >> 10% Rt PTX, diffuse tree in bud and nodular opacities, debris within b/l mainstem bronchi, mucous plugging lower lobes  CT abd/pelvis 6/09 >> normal  Renal u/s 6/10 >> echogenic kidney b/l  Echo 6/10 >> EF 55 to 60%, grade 2 DD  cth 6/14: No acute intracranial abnormality.  Ct chest abd pelvis 6/14: Extensive bilateral airspace opacities, worsening since prior study. Dense consolidation in both lower lobes with small bilateral effusions. Right chest tube in place. Small residual right anterior pneumothorax. No acute findings in the abdomen or pelvis.  Micro Data:  COVID 6/9 > negative Blood culture 6/9 > lactobacillus Urine culture 6/9 > negative 6/14 blood: ngtd 6/15 resp: enterobacter  Antimicrobials:  Zosyn 6/09 >> 6/14 Vancomycin 6/09 >> 6/10 Zyvox 6/11 linezolid 6/15-> merrem 6/15->  Interim history/subjective:  Tracheostomy in place.  Periods of extreme agitation.  Transitioning to enteral medication.  Now off fentanyl infusion.  Will fluctuate from sedation to severe  agitation.  Objective   Blood pressure (!) 115/49, pulse 97, temperature (!) 102.2 F (39 C), temperature source Oral, resp. rate (!) 27, height 6\' 1"  (1.854 m), weight 81.7 kg, SpO2 100 %.    Vent Mode: PCV FiO2 (%):  [40 %] 40 % Set Rate:  [26 bmp] 26 bmp PEEP:  [5 cmH20] 5 cmH20 Pressure Support:  [5 cmH20] 5 cmH20 Plateau Pressure:  [15 cmH20-20 cmH20] 20 cmH20   Intake/Output Summary (Last 24 hours) at 08/05/2019 1338 Last data filed at 08/05/2019 1300 Gross per 24 hour  Intake 4566.16 ml  Output 4615 ml  Net -48.84 ml   Filed Weights   08/03/19 0140 08/04/19 0214 08/05/19 0330  Weight: 81.6 kg 82.3 kg 81.7 kg    Examination:  GEN: young man on vent HEENT: trach in place, copious secretions CV: regular, tachycardic, ext warm PULM: Rhonci R>L, doing well on PS GI: Soft, +BS EXT: No edema NEURO: moves all 4 ext but not to command, agitated PSYCH: RASS +1 SKIN: No rashes   Resolved/Stable Hospital Problem list   Hyperkalemia, Anion gap metabolic acidosis with lactic acidosis, Hypoglycemia Shock Reactive Afib/RVR Thrombocytopenia Anemia Transaminitis Gastritis  Assessment & Plan:   Critically ill due to acute hypoxic respiratory failure from cardiac arrest and aspiration pneumonia. - s/p trach 6/17 due to delirium/encephalopathy and secretion clearance issues -Daily pressure support and trach collar trials - PT/OT/SLP consults appreciated - I am hopeful we can wean him from trach  Enterobacter pneumonia -continue meropenem x 7  days, DC linezolid  Rt pneumothorax after CPR no recurrence since chest tube removed. - f/u CXR periodically  AKI from rhabdomyolysis. Hypokalemia. - renal fx stable - ck improving to <4K - cont to follow uop and renal indices  Hypernatremia:  -continue aggressive free water supplementation.  Acute metabolic encephalopathy from anoxia, sepsis, renal failure. -Intensify enteral sedative regimen  Polysubstance abuse. 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 -Propranolol and clonidine for central storming.  Best practice:  Diet: tube feeds DVT prophylaxis: lovenox GI prophylaxis: protonix Mobility: up to chair Code Status: Full Disposition: ICU  Family updated daily  CRITICAL CARE Performed by: Kipp Brood   Total critical care time: 35 minutes  Critical care time was exclusive of separately billable procedures and treating other patients.  Critical care was necessary to treat or prevent imminent or life-threatening deterioration.  Critical care was time spent personally by me on the following activities: development of treatment plan with patient and/or surrogate as well as nursing, discussions with consultants, evaluation of patient's response to treatment, examination of patient, obtaining history from patient or surrogate, ordering and performing treatments and interventions, ordering and review of laboratory studies, ordering and review of radiographic studies, pulse oximetry, re-evaluation of patient's condition and participation in multidisciplinary rounds.  Kipp Brood, MD [facility] ICU Physician [place]  Pager: [phone] Mobile: [phone] After hours: [phone].

## 2019-08-05 NOTE — Evaluation (Signed)
Physical Therapy Evaluation Patient Details Name: George Kelly MRN: [id number] DOB: 06/15/1990 Today's Date: 08/05/2019   History of Present Illness  29 yo male found unresponsive with probable drug overdose.  PEA arrest with ROSC after about 15 minutes.  Found to have lactic acidosis, AKI from rhabdomyolysis, aspiration pneumonia, shock liver, hypothermia.  UDS positive for cocaine, benzo's, THC.   6/17 tracheostomy.  Clinical Impression  Pt admitted with/for problems due to and arising from probable drub O/D.  Pt minimally alert, restless not following commands, needing total assist of 2 persons for basic mobility at this time..  Pt currently limited functionally due to the problems listed. ( See problems list.)   Pt will benefit from PT to maximize function and safety in order to get ready for next venue listed below.     Follow Up Recommendations CIR;Supervision/Assistance - 24 hour    Equipment Recommendations  Other (comment) (TBA)    Recommendations for Other Services Rehab consult     Precautions / Restrictions Precautions Precautions: Fall Restrictions Weight Bearing Restrictions: No      Mobility  Bed Mobility Overal bed mobility: Needs Assistance Bed Mobility: Rolling;Supine to Sit;Sit to Supine Rolling: Total assist;+2 for physical assistance   Supine to sit: Total assist;+2 for physical assistance Sit to supine: Total assist;+2 for physical assistance   General bed mobility comments: pt shows movement with restlessness, but not directed, purposeful or to command.  Transfers                 General transfer comment: NT  Ambulation/Gait                Stairs            Wheelchair Mobility    Modified Rankin (Stroke Patients Only)       Balance Overall balance assessment: Needs assistance Sitting-balance support: Feet supported;Single extremity supported;No upper extremity supported Sitting balance-Leahy Scale: Poor Sitting  balance - Comments: truncal reaction to being moved around midline, but not coordinated, needing maximal assist                                     Pertinent Vitals/Pain Pain Assessment: Faces Faces Pain Scale: No hurt (but irritated by trach)    Home Living Family/patient expects to be discharged to:: Unsure                      Prior Function           Comments: no information other than was partying.     Hand Dominance        Extremity/Trunk Assessment   Upper Extremity Assessment Upper Extremity Assessment: RUE deficits/detail;LUE deficits/detail RUE Deficits / Details: weak mobility in flexion/add, spontaneous, not to command.  No grip to command. RUE Coordination: decreased fine motor;decreased gross motor LUE Deficits / Details: weak  elbow flexion and overall adduction, spontaneous, not to command. no grip to command    Lower Extremity Assessment Lower Extremity Assessment: RLE deficits/detail;LLE deficits/detail RLE Deficits / Details: good range overall, initially minor heel slide in his restless state, no movement to command, but in sitting, pt kicked each leg out in a partial LAQ and attmpted to mimic hip flexion  bilaterally and weakly to command. RLE Coordination: decreased fine motor;decreased gross motor LLE Deficits / Details: same as R LE, L appears to be incrementally stronger than right in this very  limited MMT. LLE Coordination: decreased fine motor;decreased gross motor    Cervical / Trunk Assessment Cervical / Trunk Assessment:  (noted weak abdominal reaction to coughing )  Communication   Communication: Tracheostomy  Cognition Arousal/Alertness: Lethargic Behavior During Therapy: Restless Overall Cognitive Status: Difficult to assess                                        General Comments General comments (skin integrity, edema, etc.): HR in the 90 to 100's, SpO2 in the upper 90's% on vent support,   Frequent coughing, copious scretins out of mouth and in trach.    Exercises Other Exercises Other Exercises: basc ROM to all 4 extremities.   Assessment/Plan    PT Assessment Patient needs continued PT services  PT Problem List Decreased strength;Decreased activity tolerance;Decreased balance;Decreased mobility;Decreased coordination;Decreased safety awareness;Decreased knowledge of precautions;Cardiopulmonary status limiting activity       PT Treatment Interventions DME instruction;Gait training;Functional mobility training;Therapeutic activities;Therapeutic exercise;Balance training;Neuromuscular re-education;Patient/family education    PT Goals (Current goals can be found in the Care Plan section)  Acute Rehab PT Goals PT Goal Formulation: Patient unable to participate in goal setting Time For Goal Achievement: 08/19/19 Potential to Achieve Goals: Fair    Frequency Min 4X/week   Barriers to discharge        Co-evaluation               AM-PAC PT "6 Clicks" Mobility  Outcome Measure Help needed turning from your back to your side while in a flat bed without using bedrails?: Total Help needed moving from lying on your back to sitting on the side of a flat bed without using bedrails?: Total Help needed moving to and from a bed to a chair (including a wheelchair)?: Total Help needed standing up from a chair using your arms (e.g., wheelchair or bedside chair)?: Total Help needed to walk in hospital room?: Total Help needed climbing 3-5 steps with a railing? : Total 6 Click Score: 6    End of Session Equipment Utilized During Treatment: Oxygen Activity Tolerance: Patient limited by pain (restless and agitated by trach) Patient left: in bed;with call bell/phone within reach;with bed alarm set;with nursing/sitter in room Nurse Communication: Mobility status PT Visit Diagnosis: Muscle weakness (generalized) (M62.81);Difficulty in walking, not elsewhere classified  (R26.2);Other abnormalities of gait and mobility (R26.89)    Time: 0981-1914 PT Time Calculation (min) (ACUTE ONLY): 18 min   Charges:   PT Evaluation $PT Eval High Complexity: 1 High          08/05/2019  Ginger Carne., PT Acute Rehabilitation Services [phone]  (pager) [phone]  (office)  Tessie Fass Domenica Weightman 08/05/2019, 11:56 AM

## 2019-08-05 NOTE — Progress Notes (Signed)
Replaced K+ per CCM protocol for K+ 3.3, pt on vent with cortrack and c-line, BMP already ordered for 6/21

## 2019-08-05 NOTE — Progress Notes (Signed)
RT note-Attempted to placed patient aerosol trach collar. Tolerated it for 35 min, still tachpneic , placed to full support. Large amount pink/tan thick secretions, patient has a good strong cough, conitnue to monitor for weaning.

## 2019-08-05 NOTE — Progress Notes (Signed)
eLink Physician-Brief Progress Note Patient Name: George Kelly DOB: 06/15/1990 MRN: [id number]   Date of Service  08/05/2019  HPI/Events of Note  Inquiry about AM labs  eICU Interventions  CBC BMP ordered     Intervention Category Minor Interventions: Clinical assessment - ordering diagnostic tests  Darl Pikes 08/05/2019, 3:58 AM

## 2019-08-05 NOTE — Progress Notes (Signed)
Inpatient Rehab Admissions Coordinator Note:   Per PT recommendation, pt was screened for CIR candidacy by Wolfgang Phoenix, MS, CCC-SLp.  At this time we are not recommending an Inpatient Rehab consult.  Will continue to monitor pt's progress from afar.  Please contact me with questions.    Wolfgang Phoenix, MS, CCC-SLP Admissions Coordinator [phone] 08/05/19 4:41 PM

## 2019-08-06 LAB — GLUCOSE, CAPILLARY
Glucose-Capillary: 117 mg/dL — ABNORMAL HIGH (ref 70–99)
Glucose-Capillary: 86 mg/dL (ref 70–99)
Glucose-Capillary: 122 mg/dL — ABNORMAL HIGH (ref 70–99)
Glucose-Capillary: 94 mg/dL (ref 70–99)
Glucose-Capillary: 82 mg/dL (ref 70–99)
Glucose-Capillary: 84 mg/dL (ref 70–99)
Glucose-Capillary: 113 mg/dL — ABNORMAL HIGH (ref 70–99)

## 2019-08-06 LAB — BASIC METABOLIC PANEL
Anion gap: 7 (ref 5–15)
BUN: 25 mg/dL — ABNORMAL HIGH (ref 6–20)
CO2: 21 mmol/L — ABNORMAL LOW (ref 22–32)
Calcium: 8.6 mg/dL — ABNORMAL LOW (ref 8.9–10.3)
Chloride: 118 mmol/L — ABNORMAL HIGH (ref 98–111)
Creatinine, Ser: 1.52 mg/dL — ABNORMAL HIGH (ref 0.61–1.24)
GFR calc Af Amer: >60 mL/min (ref >60)
GFR calc non Af Amer: >60 mL/min (ref >60)
Glucose, Bld: 113 mg/dL — ABNORMAL HIGH (ref 70–99)
Potassium: 4.7 mmol/L (ref 3.5–5.1)
Sodium: 146 mmol/L — ABNORMAL HIGH (ref 135–145)

## 2019-08-06 LAB — CBC WITH DIFFERENTIAL/PLATELET
Abs Immature Granulocytes: 0.09 10*3/uL — ABNORMAL HIGH (ref 0.00–0.07)
Basophils Absolute: 0.1 10*3/uL (ref 0.0–0.1)
Basophils Relative: 0 %
Eosinophils Absolute: 0.3 10*3/uL (ref 0.0–0.5)
Eosinophils Relative: 2 %
HCT: 31.6 % — ABNORMAL LOW (ref 39.0–52.0)
Hemoglobin: 10 g/dL — ABNORMAL LOW (ref 13.0–17.0)
Immature Granulocytes: 1 %
Lymphocytes Relative: 7 %
Lymphs Abs: 1.3 10*3/uL (ref 0.7–4.0)
MCH: 28.2 pg (ref 26.0–34.0)
MCHC: 31.6 g/dL (ref 30.0–36.0)
MCV: 89.3 fL (ref 80.0–100.0)
Monocytes Absolute: 1.3 10*3/uL — ABNORMAL HIGH (ref 0.1–1.0)
Monocytes Relative: 7 %
Neutro Abs: 14.9 10*3/uL — ABNORMAL HIGH (ref 1.7–7.7)
Neutrophils Relative %: 83 %
Platelets: 530 10*3/uL — ABNORMAL HIGH (ref 150–400)
RBC: 3.54 MIL/uL — ABNORMAL LOW (ref 4.22–5.81)
RDW: 16.3 % — ABNORMAL HIGH (ref 11.5–15.5)
WBC: 18 10*3/uL — ABNORMAL HIGH (ref 4.0–10.5)
nRBC: 0 % (ref 0.0–0.2)

## 2019-08-06 LAB — CK: Total CK: 304 U/L (ref 49–397)

## 2019-08-06 MED ORDER — CHLORHEXIDINE GLUCONATE 0.12 % MT SOLN
OROMUCOSAL | Status: AC
  Filled 2019-08-06: qty 15

## 2019-08-06 MED ORDER — METOCLOPRAMIDE HCL 5 MG/ML IJ SOLN
10.0000 mg | Freq: Four times a day (QID) | INTRAMUSCULAR | Status: AC
  Administered 2019-08-06 – 2019-08-08 (×8): 10 mg via INTRAVENOUS
  Filled 2019-08-06 (×8): qty 2

## 2019-08-06 NOTE — Progress Notes (Signed)
Physical Therapy Treatment Patient Details Name: George Kelly MRN: [id number] DOB: 06/15/1990 Today's Date: 08/06/2019    History of Present Illness 29 yo male found unresponsive with probable drug overdose.  PEA arrest with ROSC after about 15 minutes.  Found to have lactic acidosis, AKI from rhabdomyolysis, aspiration pneumonia, shock liver, hypothermia.  UDS positive for cocaine, benzo's, THC.   6/17 tracheostomy.    PT Comments    Pt more sedated that at the last session, so not as mobile.  Emphasis on ROM and sitting EOB for pulmonary toilet and tolerance, spinal/cervical extension.    Follow Up Recommendations  CIR;Supervision/Assistance - 24 hour (hopeful will be very appropriate once is alert)     Equipment Recommendations  Other (comment) (TBA)    Recommendations for Other Services       Precautions / Restrictions Precautions Precautions: Fall    Mobility  Bed Mobility Overal bed mobility: Needs Assistance Bed Mobility: Rolling;Supine to Sit;Sit to Supine Rolling: Total assist;+2 for physical assistance   Supine to sit: Total assist;+2 for physical assistance Sit to supine: Total assist;+2 for physical assistance   General bed mobility comments: pt moves more in his restlessness, than to tactile direction and assist  Transfers                    Ambulation/Gait                 Stairs             Wheelchair Mobility    Modified Rankin (Stroke Patients Only)       Balance Overall balance assessment: Needs assistance Sitting-balance support: Feet supported;No upper extremity supported;Single extremity supported Sitting balance-Leahy Scale: Poor Sitting balance - Comments: little reaction of trunk and neck today, but did half try to sustain holding up his head and make weak truncal reactions Postural control: Posterior lean;Right lateral lean                                  Cognition Arousal/Alertness: Lethargic  (pt opening eye occasionally, bu sedated) Behavior During Therapy: Restless Overall Cognitive Status: Difficult to assess                                        Exercises Other Exercises Other Exercises: PROM at shds, elbows, wrists/fingers, hips, knees ankles/heel cords, neck    General Comments General comments (skin integrity, edema, etc.): vss      Pertinent Vitals/Pain Pain Assessment: Faces Faces Pain Scale: Hurts a little bit Pain Location: shd ROM Pain Descriptors / Indicators: Grimacing Pain Intervention(s): Monitored during session    Home Living                      Prior Function            PT Goals (current goals can now be found in the care plan section) Acute Rehab PT Goals PT Goal Formulation: Patient unable to participate in goal setting Time For Goal Achievement: 08/19/19 Potential to Achieve Goals: Fair Progress towards PT goals: Not progressing toward goals - comment (too sedated, but appropriate session )    Frequency    Min 4X/week      PT Plan Current plan remains appropriate    Co-evaluation PT/OT/SLP Co-Evaluation/Treatment: Yes Reason for Co-Treatment: Complexity of  the patient's impairments (multi-system involvement) PT goals addressed during session: Mobility/safety with mobility        AM-PAC PT "6 Clicks" Mobility   Outcome Measure  Help needed turning from your back to your side while in a flat bed without using bedrails?: Total Help needed moving from lying on your back to sitting on the side of a flat bed without using bedrails?: Total Help needed moving to and from a bed to a chair (including a wheelchair)?: Total Help needed standing up from a chair using your arms (e.g., wheelchair or bedside chair)?: Total Help needed to walk in hospital room?: Total Help needed climbing 3-5 steps with a railing? : Total 6 Click Score: 6    End of Session Equipment Utilized During Treatment: Oxygen Activity  Tolerance: Patient tolerated treatment well;Patient limited by lethargy Patient left: in bed;with call bell/phone within reach;with bed alarm set;with nursing/sitter in room Nurse Communication: Mobility status PT Visit Diagnosis: Muscle weakness (generalized) (M62.81);Difficulty in walking, not elsewhere classified (R26.2);Other abnormalities of gait and mobility (R26.89)     Time: 1631-1700 PT Time Calculation (min) (ACUTE ONLY): 29 min  Charges:  $Therapeutic Activity: 8-22 mins                     08/06/2019  Jacinto Halim., PT Acute Rehabilitation Services [phone]  (pager) [phone]  (office)   [name] Rhylan Kagel 08/06/2019, 5:18 PM

## 2019-08-06 NOTE — Progress Notes (Signed)
NAME:  George Kelly, MRN:  [id number], DOB:  06/15/1990, LOS: 12 ADMISSION DATE:  07/25/2019, CONSULTATION DATE:  07/25/2019 REFERRING MD:  Dr. Renaye Rakers, CHIEF COMPLAINT:  Cardiac arrest    Brief History   29 yo male found unresponsive with probable drug overdose.  PEA arrest with ROSC after about 15 minutes.  Found to have lactic acidosis, AKI from rhabdomyolysis, aspiration pneumonia, shock liver, hypothermia.  UDS positive for cocaine, benzo's, THC.       Past Medical History  Substance abuse   Significant Hospital Events   6/09 Admit, start TTM and LTM 6/10 CRRT 6/11 off CRRT, rewarming from TTM 6/12 A fib with RVR >> amiodarone  Consults:  Nephrology signed off 6/12  Procedures:  ETT 6/09 >>  Rt chest tube 6/09 >>  Lt radial a line 6/09 >>  Rt [place] CVL 6/09 >>  Rt femoral HD cath 6/09 >> 6/12  Significant Diagnostic Tests:   CT head 6/09 >> no acute findings  CT chest 6/09 >> 10% Rt PTX, diffuse tree in bud and nodular opacities, debris within b/l mainstem bronchi, mucous plugging lower lobes  CT abd/pelvis 6/09 >> normal  Renal u/s 6/10 >> echogenic kidney b/l  Echo 6/10 >> EF 55 to 60%, grade 2 DD  cth 6/14: No acute intracranial abnormality.  Ct chest abd pelvis 6/14: Extensive bilateral airspace opacities, worsening since prior study. Dense consolidation in both lower lobes with small bilateral effusions. Right chest tube in place. Small residual right anterior pneumothorax. No acute findings in the abdomen or pelvis.  Micro Data:  COVID 6/9 > negative Blood culture 6/9 > lactobacillus Urine culture 6/9 > negative 6/14 blood: ngtd 6/15 resp: enterobacter  Antimicrobials:  Zosyn 6/09 - 6/14 Vancomycin 6/09 -6/10 Zyvox 6/11 linezolid 6/15-6/16 merrem 6/15-6/16 Cefepime 6/17>>  Interim history/subjective:  Tracheostomy in place.  Periods of extreme agitation.  Transitioning to enteral medication. Still fluctuating from sedation to severe  agitation Emesis this morning when he was being suctioned Temp 103 this AM  Objective   Blood pressure (!) 149/74, pulse (!) 108, temperature (!) 103.3 F (39.6 C), temperature source Oral, resp. rate (!) 27, height 6\' 1"  (1.854 m), weight 81.7 kg, SpO2 99 %.    Vent Mode: CPAP;PSV FiO2 (%):  [40 %] 40 % Set Rate:  [26 bmp] 26 bmp PEEP:  [5 cmH20] 5 cmH20 Pressure Support:  [10 cmH20] 10 cmH20 Plateau Pressure:  [14 cmH20-18 cmH20] 16 cmH20   Intake/Output Summary (Last 24 hours) at 08/06/2019 0850 Last data filed at 08/06/2019 08/08/2019 Gross per 24 hour  Intake 1725 ml  Output 5550 ml  Net -3825 ml   Filed Weights   08/03/19 0140 08/04/19 0214 08/05/19 0330  Weight: 81.6 kg 82.3 kg 81.7 kg    Examination:  GEN: On vent, appears comfortable at present HEENT: still with significant secretions via trach, moist oral mucosa CV: S1-S2 appreciated PULM: Rhonchi GI: Soft, +BS EXT: No edema NEURO: moves all 4 ext but not to command, agitated PSYCH: RASS +1 SKIN: No rashes   Resolved/Stable Hospital Problem list   Hyperkalemia, Anion gap metabolic acidosis with lactic acidosis, Hypoglycemia Shock Reactive Afib/RVR Thrombocytopenia Anemia Transaminitis Gastritis  Assessment & Plan:   Critically ill due to acute hypoxic respiratory failure from cardiac arrest and aspiration pneumonia. - s/p trach 6/17 due to delirium/encephalopathy and secretion clearance issues -Daily pressure support and trach collar trials - PT/OT/SLP consults appreciated -Still going from agitation to sedation  Enterobacter pneumonia -Day  4 cefepime -Still having fevers -Recent tracheal aspirate, obtain blood cultures  New fever 103 -Obtain blood cultures -Obtain tracheal aspirate  Emesis this morning Moving bowel -Start Reglan 10 mg 4 times daily for 48 hours   Rt pneumothorax after CPR no recurrence since chest tube removed. - f/u CXR periodically -Chest x-ray ordered for 6/22  AKI from  rhabdomyolysis. Hypokalemia. - renal fx stable - ck improving to <4K - cont to follow uop and renal indices  Hypernatremia:  -continue aggressive free water supplementation.  Acute metabolic encephalopathy from anoxia, sepsis, renal failure. -Intensify enteral sedative regimen  Polysubstance abuse. 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 -Propranolol and clonidine for central storming.  Best practice:  Diet: tube feeds-held this morning DVT prophylaxis: lovenox GI prophylaxis: protonix Mobility: up to chair Code Status: Full Disposition: ICU  Family updated daily  The patient is critically ill with multiple organ systems failure and requires high complexity decision making for assessment and support, frequent evaluation and titration of therapies, application of advanced monitoring technologies and extensive interpretation of multiple databases. Critical Care Time devoted to patient care services described in this note independent of APP/resident time (if applicable)  is 33 minutes.   Sherrilyn Rist MD [facility] Personal pager: [phone] If unanswered, please page CCM On-call: [phone]

## 2019-08-06 NOTE — Progress Notes (Signed)
SLP Cancellation Note  Patient Details Name: Kastin Cerda MRN: [id number] DOB: 06/15/1990   Cancelled treatment:       Reason Eval/Treat Not Completed: Patient not medically ready. Given vomiting with tracheal suction today and minimal ATC tolerance over the weekend, will defer PMSV eval until pt more stable.   Harlon Ditty, MA CCC-SLP  Acute Rehabilitation Services Pager [phone] Office [phone]  Claudine Mouton 08/06/2019, 12:35 PM

## 2019-08-06 NOTE — Evaluation (Signed)
Occupational Therapy Evaluation Patient Details Name: George Kelly MRN: [id number] DOB: 06/15/1990 Today's Date: 08/06/2019    History of Present Illness 29 yo male found unresponsive with probable drug overdose.  PEA arrest with ROSC after about 15 minutes.  Found to have lactic acidosis, AKI from rhabdomyolysis, aspiration pneumonia, shock liver, hypothermia.  UDS positive for cocaine, benzo's, THC.   6/17 tracheostomy.   Clinical Impression   This 29 y/o male presents with the above. Pt currently on trach/vent support and with sedation - will open eyes intermittently with movement/transitions but with no command follow today. Noted grimacing to bil UEs (L>R) with shoulder ROM (?pain vs discomfort). Pt requiring two person assist for mobility but tolerating sitting EOB >67min with VSS. Given level of arousal pt currently is totalA for ADL and mobility tasks. He will benefit from continued acute OT services and currently recommend post acute rehab to maximize his overall safety and independence with ADL and mobility.     Follow Up Recommendations  CIR (pending progress)    Equipment Recommendations  Other (comment) (TBD)           Precautions / Restrictions Precautions Precautions: Fall Precaution Comments: bil wrist/ankle restraints, flexi, NG Restrictions Weight Bearing Restrictions: No      Mobility Bed Mobility Overal bed mobility: Needs Assistance Bed Mobility: Rolling;Supine to Sit;Sit to Supine Rolling: Total assist;+2 for physical assistance   Supine to sit: Total assist;+2 for physical assistance Sit to supine: Total assist;+2 for physical assistance   General bed mobility comments: pt moves more in his restlessness, than to tactile direction and assist  Transfers                      Balance Overall balance assessment: Needs assistance Sitting-balance support: Feet supported;No upper extremity supported;Single extremity supported Sitting balance-Leahy  Scale: Poor Sitting balance - Comments: little reaction of trunk and neck today, but did half try to sustain holding up his head and make weak truncal reactions Postural control: Posterior lean;Right lateral lean                                 ADL either performed or assessed with clinical judgement   ADL Overall ADL's : Needs assistance/impaired                                       General ADL Comments: currently totalA     Vision         Perception     Praxis      Pertinent Vitals/Pain Pain Assessment: Faces Faces Pain Scale: Hurts a little bit Pain Location: shd ROM Pain Descriptors / Indicators: Grimacing Pain Intervention(s): Monitored during session;Repositioned     Hand Dominance     Extremity/Trunk Assessment Upper Extremity Assessment Upper Extremity Assessment: Generalized weakness (grimacing with bil shoulder ROM (L>R))   Lower Extremity Assessment Lower Extremity Assessment: Defer to PT evaluation       Communication Communication Communication: Tracheostomy   Cognition Arousal/Alertness: Lethargic (pt opening eye occasionally, but sedated) Behavior During Therapy: Restless Overall Cognitive Status: Difficult to assess                                     General Comments  VSS on  vent support     Exercises Exercises: Other exercises Other Exercises Other Exercises: PROM at shds, elbows, wrists/fingers, hips, knees ankles/heel cords, neck   Shoulder Instructions      Home Living Family/patient expects to be discharged to:: Unsure                                        Prior Functioning/Environment                   OT Problem List: Decreased strength;Decreased range of motion;Decreased activity tolerance;Impaired balance (sitting and/or standing);Decreased cognition;Decreased safety awareness;Decreased knowledge of use of DME or AE      OT Treatment/Interventions:  Self-care/ADL training;Therapeutic exercise;Energy conservation;DME and/or AE instruction;Therapeutic activities;Cognitive remediation/compensation;Patient/family education;Balance training    OT Goals(Current goals can be found in the care plan section) Acute Rehab OT Goals Patient Stated Goal: none stated OT Goal Formulation: Patient unable to participate in goal setting Potential to Achieve Goals: Fair  OT Frequency: Min 2X/week   Barriers to D/C:            Co-evaluation PT/OT/SLP Co-Evaluation/Treatment: Yes Reason for Co-Treatment: Complexity of the patient's impairments (multi-system involvement);For patient/therapist safety PT goals addressed during session: Mobility/safety with mobility OT goals addressed during session: Strengthening/ROM      AM-PAC OT "6 Clicks" Daily Activity     Outcome Measure Help from another person eating meals?: Total Help from another person taking care of personal grooming?: Total Help from another person toileting, which includes using toliet, bedpan, or urinal?: Total Help from another person bathing (including washing, rinsing, drying)?: Total Help from another person to put on and taking off regular upper body clothing?: Total Help from another person to put on and taking off regular lower body clothing?: Total 6 Click Score: 6   End of Session Equipment Utilized During Treatment: Oxygen Nurse Communication: Mobility status  Activity Tolerance: Patient limited by lethargy Patient left: in bed;with call bell/phone within reach;with restraints reapplied;with SCD's reapplied  OT Visit Diagnosis: Other abnormalities of gait and mobility (R26.89);Muscle weakness (generalized) (M62.81);Other symptoms and signs involving cognitive function                Time: 1631-1700 OT Time Calculation (min): 29 min Charges:  OT General Charges $OT Visit: 1 Visit OT Evaluation $OT Eval Moderate Complexity: 1 Mod   Marcy Siren, OT Acute  Rehabilitation Services Pager [phone] Office [phone]   George Kelly 08/06/2019, 5:57 PM

## 2019-08-06 NOTE — Progress Notes (Signed)
RT was suctioning patient's trach and pt began to vomit up tube feed, twice. RN stopped tube feeds and free water immediately.

## 2019-08-07 ENCOUNTER — Inpatient Hospital Stay (HOSPITAL_COMMUNITY): Payer: Self-pay

## 2019-08-07 LAB — CBC WITH DIFFERENTIAL/PLATELET
Abs Immature Granulocytes: 0 10*3/uL (ref 0.00–0.07)
Basophils Absolute: 0.2 10*3/uL — ABNORMAL HIGH (ref 0.0–0.1)
Basophils Relative: 1 %
Eosinophils Absolute: 0 10*3/uL (ref 0.0–0.5)
Eosinophils Relative: 0 %
HCT: 31.3 % — ABNORMAL LOW (ref 39.0–52.0)
Hemoglobin: 9.9 g/dL — ABNORMAL LOW (ref 13.0–17.0)
Lymphocytes Relative: 3 %
Lymphs Abs: 0.7 10*3/uL (ref 0.7–4.0)
MCH: 28.4 pg (ref 26.0–34.0)
MCHC: 31.6 g/dL (ref 30.0–36.0)
MCV: 89.7 fL (ref 80.0–100.0)
Monocytes Absolute: 0.9 10*3/uL (ref 0.1–1.0)
Monocytes Relative: 4 %
Neutro Abs: 21 10*3/uL — ABNORMAL HIGH (ref 1.7–7.7)
Neutrophils Relative %: 92 %
Platelets: 533 10*3/uL — ABNORMAL HIGH (ref 150–400)
RBC: 3.49 MIL/uL — ABNORMAL LOW (ref 4.22–5.81)
RDW: 16 % — ABNORMAL HIGH (ref 11.5–15.5)
WBC: 22.8 10*3/uL — ABNORMAL HIGH (ref 4.0–10.5)
nRBC: 0 % (ref 0.0–0.2)
nRBC: 0 /100 WBC

## 2019-08-07 LAB — BASIC METABOLIC PANEL
Anion gap: 10 (ref 5–15)
BUN: 29 mg/dL — ABNORMAL HIGH (ref 6–20)
CO2: 22 mmol/L (ref 22–32)
Calcium: 8.9 mg/dL (ref 8.9–10.3)
Chloride: 116 mmol/L — ABNORMAL HIGH (ref 98–111)
Creatinine, Ser: 1.37 mg/dL — ABNORMAL HIGH (ref 0.61–1.24)
GFR calc Af Amer: >60 mL/min (ref >60)
GFR calc non Af Amer: >60 mL/min (ref >60)
Glucose, Bld: 102 mg/dL — ABNORMAL HIGH (ref 70–99)
Potassium: 4.5 mmol/L (ref 3.5–5.1)
Sodium: 148 mmol/L — ABNORMAL HIGH (ref 135–145)

## 2019-08-07 LAB — GLUCOSE, CAPILLARY
Glucose-Capillary: 113 mg/dL — ABNORMAL HIGH (ref 70–99)
Glucose-Capillary: 116 mg/dL — ABNORMAL HIGH (ref 70–99)
Glucose-Capillary: 119 mg/dL — ABNORMAL HIGH (ref 70–99)
Glucose-Capillary: 75 mg/dL (ref 70–99)
Glucose-Capillary: 90 mg/dL (ref 70–99)
Glucose-Capillary: 91 mg/dL (ref 70–99)

## 2019-08-07 MED ORDER — FUROSEMIDE 10 MG/ML IJ SOLN
40.0000 mg | Freq: Once | INTRAMUSCULAR | Status: AC
  Administered 2019-08-07: 40 mg via INTRAVENOUS
  Filled 2019-08-07: qty 4

## 2019-08-07 MED ORDER — CLONIDINE HCL 0.3 MG PO TABS
0.3000 mg | ORAL_TABLET | Freq: Four times a day (QID) | ORAL | Status: DC
  Administered 2019-08-07 – 2019-08-10 (×12): 0.3 mg
  Filled 2019-08-07 (×12): qty 1

## 2019-08-07 NOTE — Progress Notes (Signed)
NAME:  George Kelly, MRN:  [id number], DOB:  06/15/1990, LOS: 13 ADMISSION DATE:  07/25/2019, CONSULTATION DATE:  07/25/2019 REFERRING MD:  Dr. Renaye Rakers, CHIEF COMPLAINT:  Cardiac arrest    Brief History   29 yo male found unresponsive with probable drug overdose.  PEA arrest with ROSC after about 15 minutes.  Found to have lactic acidosis, AKI from rhabdomyolysis, aspiration pneumonia, shock liver, hypothermia.  UDS positive for cocaine, benzo's, THC.       Past Medical History  Substance abuse   Significant Hospital Events   6/09 Admit, start TTM and LTM 6/10 CRRT 6/11 off CRRT, rewarming from TTM 6/12 A fib with RVR >> amiodarone  Consults:  Nephrology signed off 6/12  Procedures:  ETT 6/09 >>  Rt chest tube 6/09 >>  Lt radial a line 6/09 >>  Rt [place] CVL 6/09 >>  Rt femoral HD cath 6/09 >> 6/12  Significant Diagnostic Tests:   CT head 6/09 >> no acute findings  CT chest 6/09 >> 10% Rt PTX, diffuse tree in bud and nodular opacities, debris within b/l mainstem bronchi, mucous plugging lower lobes  CT abd/pelvis 6/09 >> normal  Renal u/s 6/10 >> echogenic kidney b/l  Echo 6/10 >> EF 55 to 60%, grade 2 DD  cth 6/14: No acute intracranial abnormality.  Ct chest abd pelvis 6/14: Extensive bilateral airspace opacities, worsening since prior study. Dense consolidation in both lower lobes with small bilateral effusions. Right chest tube in place. Small residual right anterior pneumothorax. No acute findings in the abdomen or pelvis.  Micro Data:  COVID 6/9 > negative Blood culture 6/9 > lactobacillus Urine culture 6/9 > negative 6/14 blood: ngtd 6/15 resp: enterobacter  Antimicrobials:  Zosyn 6/09 - 6/14 Vancomycin 6/09 -6/10 Zyvox 6/11 linezolid 6/15-6/16 merrem 6/15-6/16 Cefepime 6/17>>  Interim history/subjective:  Tracheostomy in place.  Periods of extreme agitation.  Transitioning to enteral medication. Still fluctuating from sedation to severe  agitation temperature down this morning  Objective   Blood pressure 119/62, pulse 82, temperature 98.5 F (36.9 C), temperature source Core, resp. rate 20, height 6\' 1"  (1.854 m), weight 81.7 kg, SpO2 94 %.    Vent Mode: PCV FiO2 (%):  [40 %] 40 % Set Rate:  [26 bmp] 26 bmp PEEP:  [5 cmH20] 5 cmH20 Pressure Support:  [12 cmH20] 12 cmH20 Plateau Pressure:  [12 cmH20-17 cmH20] 15 cmH20   Intake/Output Summary (Last 24 hours) at 08/07/2019 0857 Last data filed at 08/07/2019 0848 Gross per 24 hour  Intake 1568.02 ml  Output 3370 ml  Net -1801.98 ml   Filed Weights   08/03/19 0140 08/04/19 0214 08/05/19 0330  Weight: 81.6 kg 82.3 kg 81.7 kg    Examination:  GEN: On vent, resting comfortably HEENT: Significant secretions via trach CV: S1-S2 appreciated PULM: Clear breath sounds anteriorly GI: Soft, +BS EXT: No edema NEURO: moves all 4 ext but not to command, agitated PSYCH: RASS +1 SKIN: No rashes   Resolved/Stable Hospital Problem list   Hyperkalemia, Anion gap metabolic acidosis with lactic acidosis, Hypoglycemia Shock Reactive Afib/RVR Thrombocytopenia Anemia Transaminitis Gastritis  Assessment & Plan:   Critically ill due to acute hypoxic respiratory failure from cardiac arrest and aspiration pneumonia. - s/p trach 6/17 due to delirium/encephalopathy and secretion clearance issues -Daily pressure support and trach collar trials - PT/OT/SLP consults appreciated -Still going from agitation to sedation  Enterobacter pneumonia -Day 6 cefepime -Still having fevers-no fever over the last 12 hours -Recent tracheal aspirate, obtain blood  cultures -we'll stop at day 7 if no longer having fevers  New fever 103 -Obtain blood cultures- negative at present -Obtain tracheal aspirate-negative for present  Emesis this morning Moving bowel -Start Reglan 10 mg 4 times daily for 48 hours-started 6/21 -will resume enteral nutrition  Rt pneumothorax after CPR no  recurrence since chest tube removed. - f/u CXR periodically -bilateral atelectasis -bilateral effusions  AKI from rhabdomyolysis. Hypokalemia. - renal fx stable - ck improving to <4K - cont to follow uop and renal indices  Hypernatremia:  -Will reassess following labs today  Acute metabolic encephalopathy from anoxia, sepsis, renal failure. -Intensify enteral sedative regimen  Polysubstance abuse. 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 -Propranolol and clonidine for central storming.  Will discontinue propofol propofol clonidine continue Dilaudid  Best practice:  Diet: resume tube feeds DVT prophylaxis: lovenox GI prophylaxis: protonix Mobility: up to chair Code Status: Full Disposition: ICU  Family updated daily  The patient is critically ill with multiple organ systems failure and requires high complexity decision making for assessment and support, frequent evaluation and titration of therapies, application of advanced monitoring technologies and extensive interpretation of multiple databases. Critical Care Time devoted to patient care services described in this note independent of APP/resident time (if applicable)  is 30 minutes.   Sherrilyn Rist MD [facility] Personal pager: [phone] If unanswered, please page CCM On-call: [phone]

## 2019-08-07 NOTE — Progress Notes (Signed)
Physical Therapy Treatment Patient Details Name: George Kelly MRN: [id number] DOB: 06/15/1990 Today's Date: 08/07/2019    History of Present Illness 29 yo male found unresponsive with probable drug overdose.  PEA arrest with ROSC after about 15 minutes.  Found to have lactic acidosis, AKI from rhabdomyolysis, aspiration pneumonia, shock liver, hypothermia.  UDS positive for cocaine, benzo's, THC.   6/17 tracheostomy.    PT Comments    Pt still not alert or able to participate well.  Emphasis on keeping limbs and joints ready for mobility and transitions to EOB with work on truncal activation and coordination.    Follow Up Recommendations  CIR;Supervision/Assistance - 24 hour (hopeful will be very appropriate once is alert)     Equipment Recommendations  Other (comment) (TBA)    Recommendations for Other Services       Precautions / Restrictions Precautions Precautions: Fall    Mobility  Bed Mobility Overal bed mobility: Needs Assistance Bed Mobility: Rolling;Supine to Sit;Sit to Supine Rolling: Total assist;+2 for physical assistance   Supine to sit: Total assist;+2 for physical assistance Sit to supine: Total assist;+2 for physical assistance   General bed mobility comments:  As last treatment, pt moves more in his restlessness, than to tactile direction and assist  Transfers                 General transfer comment: NT  Ambulation/Gait                 Stairs             Wheelchair Mobility    Modified Rankin (Stroke Patients Only)       Balance Overall balance assessment: Needs assistance Sitting-balance support: Feet supported;No upper extremity supported;Single extremity supported Sitting balance-Leahy Scale: Poor Sitting balance - Comments: more truncal reaction today, but abdominal biased, with strong pull forward or resistance to coming back up to midline.  Conversely, little attempt to stay upright or lean back.  Pt did hold his  head up more erect and for longer before ?fatigue.                                    Cognition Arousal/Alertness: Lethargic (pt opening eye occasionally, bu sedated) Behavior During Therapy: Restless Overall Cognitive Status: Difficult to assess                                        Exercises Other Exercises Other Exercises: PROM at shds, elbows, wrists, hips, knees ankles    General Comments General comments (skin integrity, edema, etc.): vss      Pertinent Vitals/Pain Pain Assessment: Faces Faces Pain Scale: Hurts a little bit Pain Location: shd ROM Pain Descriptors / Indicators: Grimacing Pain Intervention(s): Monitored during session    Home Living                      Prior Function            PT Goals (current goals can now be found in the care plan section) Acute Rehab PT Goals Patient Stated Goal: none stated PT Goal Formulation: Patient unable to participate in goal setting Time For Goal Achievement: 08/19/19 Potential to Achieve Goals: Fair Progress towards PT goals: Progressing toward goals    Frequency    Min 3X/week (until he  becames more alert and participative)      PT Plan Current plan remains appropriate;Frequency needs to be updated    Co-evaluation PT/OT/SLP Co-Evaluation/Treatment: Yes            AM-PAC PT "6 Clicks" Mobility   Outcome Measure  Help needed turning from your back to your side while in a flat bed without using bedrails?: Total Help needed moving from lying on your back to sitting on the side of a flat bed without using bedrails?: Total Help needed moving to and from a bed to a chair (including a wheelchair)?: Total Help needed standing up from a chair using your arms (e.g., wheelchair or bedside chair)?: Total Help needed to walk in hospital room?: Total Help needed climbing 3-5 steps with a railing? : Total 6 Click Score: 6    End of Session Equipment Utilized During  Treatment: Oxygen Activity Tolerance: Patient tolerated treatment well;Patient limited by lethargy Patient left: in bed;with call bell/phone within reach;with bed alarm set Nurse Communication: Mobility status PT Visit Diagnosis: Muscle weakness (generalized) (M62.81);Difficulty in walking, not elsewhere classified (R26.2);Other abnormalities of gait and mobility (R26.89)     Time: 1352-1416 PT Time Calculation (min) (ACUTE ONLY): 24 min  Charges:  $Therapeutic Exercise: 8-22 mins $Therapeutic Activity: 8-22 mins                     08/07/2019  Jacinto Halim., PT Acute Rehabilitation Services [phone]  (pager) [phone]  (office)   [name] Dajahnae Vondra 08/07/2019, 5:00 PM

## 2019-08-08 ENCOUNTER — Inpatient Hospital Stay: Payer: Self-pay

## 2019-08-08 LAB — GLUCOSE, CAPILLARY
Glucose-Capillary: 132 mg/dL — ABNORMAL HIGH (ref 70–99)
Glucose-Capillary: 129 mg/dL — ABNORMAL HIGH (ref 70–99)
Glucose-Capillary: 114 mg/dL — ABNORMAL HIGH (ref 70–99)
Glucose-Capillary: 112 mg/dL — ABNORMAL HIGH (ref 70–99)
Glucose-Capillary: 116 mg/dL — ABNORMAL HIGH (ref 70–99)

## 2019-08-08 MED ORDER — FENTANYL 50 MCG/HR TD PT72
1.0000 | MEDICATED_PATCH | TRANSDERMAL | Status: DC
  Administered 2019-08-08 – 2019-08-14 (×3): 1 via TRANSDERMAL
  Filled 2019-08-08 (×4): qty 1

## 2019-08-08 MED ORDER — FREE WATER
200.0000 mL | Freq: Three times a day (TID) | Status: DC
  Administered 2019-08-08 – 2019-08-09 (×3): 200 mL

## 2019-08-08 MED ORDER — VITAL 1.5 CAL PO LIQD
1000.0000 mL | ORAL | Status: DC
  Administered 2019-08-08 – 2019-08-14 (×6): 1000 mL
  Filled 2019-08-08 (×11): qty 1000

## 2019-08-08 MED ORDER — SODIUM CHLORIDE 0.9 % IV SOLN: INTRAVENOUS | Status: DC | PRN

## 2019-08-08 NOTE — Progress Notes (Addendum)
Nutrition Follow-up  DOCUMENTATION CODES:   Not applicable  INTERVENTION:   Tube feeding:  -Vital 1.5 at 55 ml/hr via Cortrak (1320 ml) -Pro-Stat 30 mL TID -Free water flushes 200 ml Q8 hours- per CCM  Provides 2280 kcals, 134 g of protein and 1003 mL of fluid (1603 ml with flushes)  NUTRITION DIAGNOSIS:   Inadequate oral intake related to acute illness as evidenced by NPO status.  Ongoing   GOAL:   Patient will meet greater than or equal to 90% of their needs  Addressed via TF  MONITOR:   Vent status, Labs, Weight trends, TF tolerance  REASON FOR ASSESSMENT:   Consult, Ventilator Enteral/tube feeding initiation and management  ASSESSMENT:   29 yo male found unresponsive with possible drug OD, admitted post cardiac arrest to ICU on vent with severe shock, AKI with hyperkalemia and acidosis. PMH includes polysubstance abuse.   6/09 CRRT initiated 6/10 CRRT discontinued 6/14 CT abdomen negative for acute process 6/17 Trach placement  6/18 Cortrak placed  Starting to wean off precedex. Unable to tolerate trach collar this am. Unable to follow commands. Remains hypernatremic. Free water flushes decreased this am to 200 ml Q8 per CCM. Volume may need to be increased again if sodium continues to trend upwards. TF adjusted to better meet needs.   Patient requiring ventilator support via trach MV: 13.9 L/min Temp (24hrs), Avg:98.8 F (37.1 C), Min:97.7 F (36.5 C), Max:100.5 F (38.1 C)  Admission weight: 86.9 kg  Current weight: 81.7 kg   I/O: +1,633 ml since admit  UOP: 2,050 ml x 24 hrs  Rectal tube: 400 ml x 24 hrs   Drips: precedex  Medications: colace, miralax Labs: Na 148 (H) CBG 82-113  Diet Order:   Diet Order            Diet NPO time specified  Diet effective now                 EDUCATION NEEDS:   Not appropriate for education at this time  Skin:  Skin Assessment: Skin Integrity Issues: Skin Integrity Issues:: Other (Comment) Other:  MASD- groin  Last BM:  6/23  Height:   Ht Readings from Last 1 Encounters:  08/03/19 6\' 1"  (1.854 m)    Weight:   Wt Readings from Last 1 Encounters:  08/05/19 81.7 kg    BMI:  Body mass index is 23.76 kg/m.  Estimated Nutritional Needs:   Kcal:  2182 kcal  Protein:  130-170 g  Fluid:  >/= 2 L   [address] RD, LDN Clinical Nutrition Pager listed in AMION

## 2019-08-08 NOTE — Progress Notes (Addendum)
Occupational Therapy Treatment Patient Details Name: George Kelly MRN: [id number] DOB: 06/15/1990 Today's Date: 08/08/2019    History of present illness 29 yo male found unresponsive with probable drug overdose.  PEA arrest with ROSC after about 15 minutes.  Found to have lactic acidosis, AKI from rhabdomyolysis, aspiration pneumonia, shock liver, hypothermia.  UDS positive for cocaine, benzo's, THC.   6/17 tracheostomy.   OT comments  RN approved mobility this date. Pt lethargic throughout session. Spontaneously and intermittently opening his eyes for short duration during session. Pt not interacting with therapists and not following commands this session. Pt required totalA+2 for bed mobility and sat EOB for ~55min, vitals all within normal range. (pt on ventilator support FiO2 40% 15L/min). Pt calm throughout session and tolerated ROM to all extremities. Pt's sister and mom present during session, educated family on benefit of elevating BUE, providing ROM to all extremities and communicating with/talking to pt. Pt will continue to benefit from skilled OT services to maximize safety and independence with ADL/IADL and functional mobility. Will continue to follow acutely and progress as tolerated.    Follow Up Recommendations  CIR;Other (comment) (pending progress)    Equipment Recommendations  Other (comment) (tBD)    Recommendations for Other Services      Precautions / Restrictions Precautions Precautions: Fall Precaution Comments: bil wrist/ankle restraints, flexi, NG, vent support Restrictions Weight Bearing Restrictions: No       Mobility Bed Mobility Overal bed mobility: Needs Assistance Bed Mobility: Supine to Sit;Sit to Supine     Supine to sit: Total assist;+2 for physical assistance Sit to supine: Total assist;+2 for physical assistance   General bed mobility comments: totalA for all aspects, flat affect throughout, no agitation noted  Transfers                       Balance Overall balance assessment: Needs assistance Sitting-balance support: Feet supported;Bilateral upper extremity supported Sitting balance-Leahy Scale: Zero Sitting balance - Comments: little attempt to maintain upright head positioning, required maxA to maintain stability but with with some tone for postural control, not flaccid in sitting Postural control: Posterior lean                                 ADL either performed or assessed with clinical judgement   ADL Overall ADL's : Needs assistance/impaired                                       General ADL Comments: currently totalA for all ADL;provided hand over hand assistance to wash face, pt with no voluntary movement     Vision       Perception     Praxis      Cognition Arousal/Alertness: Lethargic Behavior During Therapy: Flat affect Overall Cognitive Status: Difficult to assess                                 General Comments: pt spontaneously opening eyes intermittently for short duration;not following any commands;no agitation during session        Exercises Exercises: Other exercises Other Exercises Other Exercises: PROM at shoulders, elbows, wrists, knees, ankles, hips Other Exercises: facilitated truncal rotation to cross midline right and left while sitting EOB  Other Exercises: educated pt's  mom and sister on benefit of ROM to extremities   Shoulder Instructions       General Comments vitals within normal range throughout session    Pertinent Vitals/ Pain       Pain Assessment: Faces Faces Pain Scale: No hurt (but irritated by trach at times) Pain Descriptors / Indicators: Grimacing Pain Intervention(s): Monitored during session;Limited activity within patient's tolerance  Home Living                                          Prior Functioning/Environment              Frequency  Min 2X/week        Progress  Toward Goals  OT Goals(current goals can now be found in the care plan section)  Progress towards OT goals: Not progressing toward goals - comment (limited by level of arousal)  Acute Rehab OT Goals Patient Stated Goal: none stated OT Goal Formulation: Patient unable to participate in goal setting Potential to Achieve Goals: Fair ADL Goals Pt Will Perform Grooming: with min assist;sitting Pt/caregiver will Perform Home Exercise Program: Increased strength;Increased ROM;Both right and left upper extremity;With minimal assist;With written HEP provided Additional ADL Goal #1: Pt will tolerate sitting EOB >7 min with minA as precursor to EOB/OOB ADL. Additional ADL Goal #2: Pt will follow 1 step simple command with 75% accuracy during ADL/functional task.  Plan Discharge plan remains appropriate    Co-evaluation                 AM-PAC OT "6 Clicks" Daily Activity     Outcome Measure   Help from another person eating meals?: Total Help from another person taking care of personal grooming?: Total Help from another person toileting, which includes using toliet, bedpan, or urinal?: Total Help from another person bathing (including washing, rinsing, drying)?: Total Help from another person to put on and taking off regular upper body clothing?: Total Help from another person to put on and taking off regular lower body clothing?: Total 6 Click Score: 6    End of Session Equipment Utilized During Treatment: Oxygen  OT Visit Diagnosis: Other abnormalities of gait and mobility (R26.89);Muscle weakness (generalized) (M62.81);Other symptoms and signs involving cognitive function   Activity Tolerance Patient limited by lethargy   Patient Left in bed;with call bell/phone within reach;with bed alarm set;with family/visitor present;with restraints reapplied;with SCD's reapplied   Nurse Communication Mobility status        Time: 5102-5852 OT Time Calculation (min): 31 min  Charges:  OT General Charges $OT Visit: 1 Visit OT Treatments $Self Care/Home Management : 23-37 mins  Rosey Bath OTR/L Acute Rehabilitation Services Office: [phone]    Rebeca Alert 08/08/2019, 10:53 AM

## 2019-08-08 NOTE — Progress Notes (Addendum)
NAME:  George Kelly, MRN:  [id number], DOB:  06/15/1990, LOS: 14 ADMISSION DATE:  07/25/2019, CONSULTATION DATE:  07/25/2019 REFERRING MD:  Dr. Renaye Rakers, CHIEF COMPLAINT:  Cardiac arrest    Brief History   29 yo male found unresponsive with probable drug overdose.  PEA arrest with ROSC after about 15 minutes.  Found to have lactic acidosis, AKI from rhabdomyolysis, aspiration pneumonia, shock liver, hypothermia.  UDS positive for cocaine, benzo's, THC.       Past Medical History  Substance abuse   Significant Hospital Events   6/09 Admit, start TTM and LTM 6/10 CRRT 6/11 off CRRT, rewarming from TTM 6/12 A fib with RVR >> amiodarone  Consults:  Nephrology signed off 6/12  Procedures:  ETT 6/09 >>  Rt chest tube 6/09 >>  Lt radial a line 6/09 >>  Rt [place] CVL 6/09 >>  Rt femoral HD cath 6/09 >> 6/12  Significant Diagnostic Tests:   CT head 6/09 >> no acute findings  CT chest 6/09 >> 10% Rt PTX, diffuse tree in bud and nodular opacities, debris within b/l mainstem bronchi, mucous plugging lower lobes  CT abd/pelvis 6/09 >> normal  Renal u/s 6/10 >> echogenic kidney b/l  Echo 6/10 >> EF 55 to 60%, grade 2 DD  cth 6/14: No acute intracranial abnormality.  Ct chest abd pelvis 6/14: Extensive bilateral airspace opacities, worsening since prior study. Dense consolidation in both lower lobes with small bilateral effusions. Right chest tube in place. Small residual right anterior pneumothorax. No acute findings in the abdomen or pelvis.  Micro Data:  COVID 6/9 > negative Blood culture 6/9 > lactobacillus Urine culture 6/9 > negative 6/14 blood: ngtd 6/15 resp: enterobacter  Antimicrobials:  Zosyn 6/09 - 6/14 Vancomycin 6/09 -6/10 Zyvox 6/11 linezolid 6/15-6/16 merrem 6/15-6/16 Cefepime 6/17>>  Interim history/subjective:  Tracheostomy in place.  Periods of extreme agitation.  Transitioning to enteral medication. More calm overnight Did not tolerate trach collar  this morning  Objective   Blood pressure 111/73, pulse 82, temperature 97.9 F (36.6 C), temperature source Oral, resp. rate (!) 43, height 6\' 1"  (1.854 m), weight 81.7 kg, SpO2 97 %.    Vent Mode: PCV FiO2 (%):  [40 %] 40 % Set Rate:  [26 bmp] 26 bmp PEEP:  [5 cmH20] 5 cmH20 Plateau Pressure:  [10 cmH20-27 cmH20] 27 cmH20   Intake/Output Summary (Last 24 hours) at 08/08/2019 08/10/2019 Last data filed at 08/08/2019 0800 Gross per 24 hour  Intake 2644.71 ml  Output 2450 ml  Net 194.71 ml   Filed Weights   08/03/19 0140 08/04/19 0214 08/05/19 0330  Weight: 81.6 kg 82.3 kg 81.7 kg    Examination:  GEN: On vent HEENT: Significant secretions via trach CV: S1-S2 appreciated PULM: Clear breath sounds, decreased at the bases GI: Soft, +BS EXT: No edema NEURO: Moves all extremities PSYCH: RASS +1 SKIN: No rashes  Resolved/Stable Hospital Problem list   Hyperkalemia, Anion gap metabolic acidosis with lactic acidosis, Hypoglycemia Shock Reactive Afib/RVR Thrombocytopenia Anemia Transaminitis Gastritis  Assessment & Plan:   Critically ill due to acute hypoxic respiratory failure from cardiac arrest and aspiration pneumonia. - s/p trach 6/17 due to delirium/encephalopathy and secretion clearance issues -Daily pressure support and trach collar trials - PT/OT/SLP consults appreciated -A little bit calmer overall  Enterobacter pneumonia -Day 7 cefepime -Still having fevers-no fever over the last 12 hours -Recent tracheal aspirate, obtain blood cultures -will stop at day 7 if no longer having fevers  New fever  103 Fever trend is better -Blood cultures negative so far -Tracheal aspirate with negative cultures so far  Rt pneumothorax after CPR no recurrence since chest tube removed. - f/u CXR periodically -bilateral atelectasis -bilateral effusions  AKI from rhabdomyolysis. Hypokalemia. - renal fx stable - ck normalized - cont to follow uop and renal  indices  Hypernatremia:  -Still elevated -Decrease free water  Acute metabolic encephalopathy from anoxia, sepsis, renal failure. -Intensify enteral sedative regimen  Polysubstance abuse. 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 -Propranolol and clonidine for central storming.  Propofol discontinued On clonidine Will try and replace Dilaudid with fentanyl patch Wean off Precedex  Best practice:  Diet: resume tube feeds DVT prophylaxis: lovenox GI prophylaxis: protonix Mobility: As tolerated Code Status: Full Disposition: ICU  Updated family at bedside  The patient is critically ill with multiple organ systems failure and requires high complexity decision making for assessment and support, frequent evaluation and titration of therapies, application of advanced monitoring technologies and extensive interpretation of multiple databases. Critical Care Time devoted to patient care services described in this note independent of APP/resident time (if applicable)  is 32 minutes.   Sherrilyn Rist MD [facility] Personal pager: [phone] If unanswered, please page CCM On-call: [phone]

## 2019-08-08 NOTE — Progress Notes (Signed)
Peripherally Inserted Central Catheter Placement  The IV Nurse has discussed with the patient and/or persons authorized to consent for the patient, the purpose of this procedure and the potential benefits and risks involved with this procedure.  The benefits include less needle sticks, lab draws from the catheter, and the patient may be discharged home with the catheter. Risks include, but not limited to, infection, bleeding, blood clot (thrombus formation), and puncture of an artery; nerve damage and irregular heartbeat and possibility to perform a PICC exchange if needed/ordered by physician.  Alternatives to this procedure were also discussed.  Bard Power PICC patient education guide, fact sheet on infection prevention and patient information card has been provided to patient /or left at bedside.  Consent obtained with mother via telephone    PICC Placement Documentation  PICC Double Lumen 08/08/19 PICC Left Basilic 47 cm 0 cm (Active)  Indication for Insertion or Continuance of Line Prolonged intravenous therapies 08/08/19 1400  Exposed Catheter (cm) 0 cm 08/08/19 1400  Site Assessment Clean;Dry;Intact 08/08/19 1400  Lumen #1 Status Flushed;Saline locked;Blood return noted 08/08/19 1400  Lumen #2 Status Flushed;Saline locked;Blood return noted 08/08/19 1400  Dressing Type Transparent;Securing device 08/08/19 1400  Dressing Status Clean;Dry;Intact;Antimicrobial disc in place 08/08/19 1400  Dressing Change Due 08/15/19 08/08/19 1400       Franne Grip [place] 08/08/2019, 2:48 PM

## 2019-08-08 NOTE — Progress Notes (Signed)
SLP Cancellation Note  Patient Details Name: George Kelly MRN: [id number] DOB: 06/15/1990   Cancelled treatment:        Pt's MD and RN present and report pt did not tolerate trach collar this morning. RN states he, at times attempts to mouth words but is not following commands or "connecting with what's going on/environment". ST will follow for appropriateness for inline PMV and/or PMV on trach collar.    George Kelly 08/08/2019, 10:26 AM  George Kelly.Ed Nurse, children's [phone] Office [phone]

## 2019-08-09 ENCOUNTER — Inpatient Hospital Stay (HOSPITAL_COMMUNITY): Payer: Self-pay

## 2019-08-09 LAB — CULTURE, RESPIRATORY W GRAM STAIN

## 2019-08-09 LAB — CBC WITH DIFFERENTIAL/PLATELET
Abs Immature Granulocytes: 0.69 10*3/uL — ABNORMAL HIGH (ref 0.00–0.07)
Basophils Absolute: 0.1 10*3/uL (ref 0.0–0.1)
Basophils Relative: 1 %
Eosinophils Absolute: 0.4 10*3/uL (ref 0.0–0.5)
Eosinophils Relative: 2 %
HCT: 30.2 % — ABNORMAL LOW (ref 39.0–52.0)
Hemoglobin: 9.7 g/dL — ABNORMAL LOW (ref 13.0–17.0)
Immature Granulocytes: 4 %
Lymphocytes Relative: 14 %
Lymphs Abs: 2.4 10*3/uL (ref 0.7–4.0)
MCH: 28 pg (ref 26.0–34.0)
MCHC: 32.1 g/dL (ref 30.0–36.0)
MCV: 87.3 fL (ref 80.0–100.0)
Monocytes Absolute: 1.6 10*3/uL — ABNORMAL HIGH (ref 0.1–1.0)
Monocytes Relative: 9 %
Neutro Abs: 11.6 10*3/uL — ABNORMAL HIGH (ref 1.7–7.7)
Neutrophils Relative %: 70 %
Platelets: 582 10*3/uL — ABNORMAL HIGH (ref 150–400)
RBC: 3.46 MIL/uL — ABNORMAL LOW (ref 4.22–5.81)
RDW: 15.4 % (ref 11.5–15.5)
WBC: 16.7 10*3/uL — ABNORMAL HIGH (ref 4.0–10.5)
nRBC: 0.1 % (ref 0.0–0.2)

## 2019-08-09 LAB — BASIC METABOLIC PANEL
Anion gap: 11 (ref 5–15)
BUN: 31 mg/dL — ABNORMAL HIGH (ref 6–20)
CO2: 23 mmol/L (ref 22–32)
Calcium: 8.7 mg/dL — ABNORMAL LOW (ref 8.9–10.3)
Chloride: 117 mmol/L — ABNORMAL HIGH (ref 98–111)
Creatinine, Ser: 1.37 mg/dL — ABNORMAL HIGH (ref 0.61–1.24)
GFR calc Af Amer: >60 mL/min (ref >60)
GFR calc non Af Amer: >60 mL/min (ref >60)
Glucose, Bld: 129 mg/dL — ABNORMAL HIGH (ref 70–99)
Potassium: 3.7 mmol/L (ref 3.5–5.1)
Sodium: 151 mmol/L — ABNORMAL HIGH (ref 135–145)

## 2019-08-09 LAB — GLUCOSE, CAPILLARY
Glucose-Capillary: 106 mg/dL — ABNORMAL HIGH (ref 70–99)
Glucose-Capillary: 109 mg/dL — ABNORMAL HIGH (ref 70–99)
Glucose-Capillary: 116 mg/dL — ABNORMAL HIGH (ref 70–99)
Glucose-Capillary: 86 mg/dL (ref 70–99)

## 2019-08-09 MED ORDER — IBUPROFEN 100 MG/5ML PO SUSP
400.0000 mg | Freq: Three times a day (TID) | ORAL | Status: DC | PRN
  Administered 2019-08-11: 400 mg via ORAL
  Filled 2019-08-09: qty 20

## 2019-08-09 MED ORDER — DEXTROSE 5 % IV BOLUS
500.0000 mL | Freq: Once | INTRAVENOUS | Status: AC
  Administered 2019-08-09: 500 mL via INTRAVENOUS

## 2019-08-09 MED ORDER — FREE WATER
500.0000 mL | Status: DC
  Administered 2019-08-09 – 2019-08-11 (×13): 500 mL

## 2019-08-09 MED ORDER — DEXMEDETOMIDINE HCL IN NACL 400 MCG/100ML IV SOLN
0.0000 ug/kg/h | INTRAVENOUS | Status: DC
  Administered 2019-08-09 – 2019-08-10 (×3): 1.2 ug/kg/h via INTRAVENOUS
  Administered 2019-08-10: 0.8 ug/kg/h via INTRAVENOUS
  Administered 2019-08-10: 1.1 ug/kg/h via INTRAVENOUS
  Administered 2019-08-10: 0.8 ug/kg/h via INTRAVENOUS
  Administered 2019-08-10: 0.6 ug/kg/h via INTRAVENOUS
  Administered 2019-08-11: 0.9 ug/kg/h via INTRAVENOUS
  Administered 2019-08-11: 0.8 ug/kg/h via INTRAVENOUS
  Administered 2019-08-12: 1 ug/kg/h via INTRAVENOUS
  Administered 2019-08-12: 0.9 ug/kg/h via INTRAVENOUS
  Administered 2019-08-12 (×2): 0.7 ug/kg/h via INTRAVENOUS
  Administered 2019-08-12: 1 ug/kg/h via INTRAVENOUS
  Administered 2019-08-13: 0.7 ug/kg/h via INTRAVENOUS
  Administered 2019-08-13: 0.4 ug/kg/h via INTRAVENOUS
  Administered 2019-08-13: 1 ug/kg/h via INTRAVENOUS
  Administered 2019-08-14: 0.9 ug/kg/h via INTRAVENOUS
  Administered 2019-08-14: 1 ug/kg/h via INTRAVENOUS
  Administered 2019-08-14: 1.2 ug/kg/h via INTRAVENOUS
  Administered 2019-08-14: 1.202 ug/kg/h via INTRAVENOUS
  Administered 2019-08-15 (×2): 0.6 ug/kg/h via INTRAVENOUS
  Administered 2019-08-15: 0.8 ug/kg/h via INTRAVENOUS
  Administered 2019-08-15: 1.2 ug/kg/h via INTRAVENOUS
  Administered 2019-08-15: 1.1 ug/kg/h via INTRAVENOUS
  Administered 2019-08-16: 0.8 ug/kg/h via INTRAVENOUS
  Administered 2019-08-16: 1.2 ug/kg/h via INTRAVENOUS
  Administered 2019-08-16: 0.8 ug/kg/h via INTRAVENOUS
  Administered 2019-08-16 (×2): 1.2 ug/kg/h via INTRAVENOUS
  Administered 2019-08-17: 0.9 ug/kg/h via INTRAVENOUS
  Administered 2019-08-17: 0.8 ug/kg/h via INTRAVENOUS
  Administered 2019-08-18 (×2): 0.4 ug/kg/h via INTRAVENOUS
  Administered 2019-08-19 (×2): 0.5 ug/kg/h via INTRAVENOUS
  Administered 2019-08-21: 0.4 ug/kg/h via INTRAVENOUS
  Filled 2019-08-09 (×2): qty 100
  Filled 2019-08-09: qty 200
  Filled 2019-08-09 (×31): qty 100
  Filled 2019-08-09: qty 200
  Filled 2019-08-09 (×4): qty 100

## 2019-08-09 NOTE — Progress Notes (Signed)
NAME:  George Kelly, MRN:  [id number], DOB:  06/15/1990, LOS: 20 ADMISSION DATE:  07/25/2019, CONSULTATION DATE:  07/25/2019 REFERRING MD:  Dr. Langston Masker, CHIEF COMPLAINT:  Cardiac arrest    Brief History   29 yo male found unresponsive with probable drug overdose.  PEA arrest with ROSC after about 15 minutes.  Found to have lactic acidosis, AKI from rhabdomyolysis, aspiration pneumonia, shock liver, hypothermia.  UDS positive for cocaine, benzo's, THC.       Past Medical History  Substance abuse   Significant Hospital Events   6/09 Admit, start TTM and LTM 6/10 CRRT 6/11 off CRRT, rewarming from TTM 6/12 A fib with RVR >> amiodarone  Consults:  Nephrology signed off 6/12  Procedures:  ETT 6/09 >>  Rt chest tube 6/09 >>  Lt radial a line 6/09 >>  Rt [place] CVL 6/09 >>  Rt femoral HD cath 6/09 >> 6/12  Significant Diagnostic Tests:   CT head 6/09 >> no acute findings  CT chest 6/09 >> 10% Rt PTX, diffuse tree in bud and nodular opacities, debris within b/l mainstem bronchi, mucous plugging lower lobes  CT abd/pelvis 6/09 >> normal  Renal u/s 6/10 >> echogenic kidney b/l  Echo 6/10 >> EF 55 to 60%, grade 2 DD  cth 6/14: No acute intracranial abnormality.  Ct chest abd pelvis 6/14: Extensive bilateral airspace opacities, worsening since prior study. Dense consolidation in both lower lobes with small bilateral effusions. Right chest tube in place. Small residual right anterior pneumothorax. No acute findings in the abdomen or pelvis.  Micro Data:  COVID 6/9 > negative Blood culture 6/9 > lactobacillus Urine culture 6/9 > negative 6/14 blood: ngtd 6/15 resp: enterobacter  Antimicrobials:  Zosyn 6/09 - 6/14 Vancomycin 6/09 -6/10 Zyvox 6/11 linezolid 6/15-6/16 merrem 6/15-6/16 Cefepime 6/17-6/23  Interim history/subjective:  Tracheostomy in place.More calm overnight Did not tolerate trach collar this morning-episodes of apnea Fever overnight -blood cultures of all  been negative  Objective   Blood pressure 124/75, pulse 66, temperature (!) 97.5 F (36.4 C), temperature source Rectal, resp. rate 19, height 6\' 1"  (1.854 m), weight 85.4 kg, SpO2 100 %.    Vent Mode: PCV FiO2 (%):  [30 %-40 %] 30 % Set Rate:  [26 bmp] 26 bmp PEEP:  [5 cmH20] 5 cmH20 Plateau Pressure:  [21 cmH20-27 cmH20] 24 cmH20   Intake/Output Summary (Last 24 hours) at 08/09/2019 0755 Last data filed at 08/09/2019 0700 Gross per 24 hour  Intake 2914.48 ml  Output 2960 ml  Net -45.52 ml   Filed Weights   08/05/19 0330 08/08/19 0500 08/09/19 0220  Weight: 81.7 kg 81.7 kg 85.4 kg    Examination:  GEN: On vent, apneic with trials HEENT: Significant secretions via trach CV: S1-S2 appreciated PULM: Occasional rhonchi, clears with suctioning GI: Soft, +BS EXT: No edema NEURO: Moves all extremities PSYCH: RASS -1 SKIN: No rashes  Resolved/Stable Hospital Problem list   Hyperkalemia, Anion gap metabolic acidosis with lactic acidosis, Hypoglycemia Shock Reactive Afib/RVR Thrombocytopenia Anemia Transaminitis Gastritis  Assessment & Plan:   Critically ill due to acute hypoxic respiratory failure from cardiac arrest and aspiration pneumonia. - s/p trach 6/17 due to delirium/encephalopathy and secretion clearance issues -Daily pressure support and trach collar trials-has not been tolerating this so far - PT/OT/SLP consults appreciated -With episodes of apnea will try and discontinue his Dilaudid and monitor  Enterobacter pneumonia -Completed cefepime for 7 days, stopped 6/23 -Still having fevers-cultures negative -Recent tracheal aspirate-negative, blood cultures negative  New fever 103 6/21, 103.2 Fever trend is better -Blood cultures negative so far -Tracheal aspirate with negative cultures so far -Possible central fever -Ibuprofen 400 q. 8 as needed for 100.3  Rt pneumothorax after CPR no recurrence since chest tube removed. - f/u CXR  periodically -bilateral atelectasis -bilateral effusions-effusions remain stable on chest x-ray  AKI from rhabdomyolysis. Hypokalemia. - renal fx stable - ck normalized - cont to follow uop and renal indices  Hypernatremia:  -Still elevated -Free water increased to 500 every 4 -We will give D5 water x1   Acute metabolic encephalopathy from anoxia, sepsis, renal failure. -Continues on Klonopin, duragesic -discontinue precedex, dilaudid  Polysubstance abuse. 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 -Propranolol and clonidine for central storming.  Propofol discontinued On clonidine Replace Dilaudid with fentanyl patch Wean off Precedex  Best practice:  Diet: resume tube feeds DVT prophylaxis: lovenox GI prophylaxis: protonix Mobility: As tolerated Code Status: Full Disposition: ICU  Updated family at bedside  The patient is critically ill with multiple organ systems failure and requires high complexity decision making for assessment and support, frequent evaluation and titration of therapies, application of advanced monitoring technologies and extensive interpretation of multiple databases. Critical Care Time devoted to patient care services described in this note independent of APP/resident time (if applicable)  is 35 minutes.   Virl Diamond MD  Pulmonary Critical Care Personal pager: [phone] If unanswered, please page CCM On-call: #[phone]

## 2019-08-09 NOTE — Progress Notes (Signed)
Physical Therapy Treatment Patient Details Name: George Kelly MRN: [id number] DOB: 06/15/1990 Today's Date: 08/09/2019    History of Present Illness 29 yo male found unresponsive with probable drug overdose.  PEA arrest with ROSC after about 15 minutes.  Found to have lactic acidosis, AKI from rhabdomyolysis, aspiration pneumonia, shock liver, hypothermia.  UDS positive for cocaine, benzo's, THC.   6/17 tracheostomy.    PT Comments    Pt continues on the vent with mild sedation due to gets agitated during wean.  Pt is lethargic, but aroused during the session and able to participate with sitting balance and transitions.     Follow Up Recommendations  CIR;Supervision/Assistance - 24 hour     Equipment Recommendations  Other (comment) (TBA)    Recommendations for Other Services       Precautions / Restrictions Precautions Precautions: Fall Precaution Comments: 4 pt restraints, flexi, condom cath and NG    Mobility  Bed Mobility Overal bed mobility: Needs Assistance Bed Mobility: Supine to Sit Rolling: Total assist   Supine to sit: Total assist;Max assist Sit to supine: Total assist;+2 for physical assistance   General bed mobility comments: pt just not following direction consistently and therefore not assisting significantly.  Transfers Overall transfer level: Needs assistance   Transfers: Sit to/from Stand Sit to Stand: Max assist;Total assist (x2)         General transfer comment: stood at EOB to scoot to [facility] and readjust/place clean padding.  Total to boost until pt kicked in automatically to bear wight. Still needed significant stability assist.  Ambulation/Gait                 Stairs             Wheelchair Mobility    Modified Rankin (Stroke Patients Only)       Balance Overall balance assessment: Needs assistance Sitting-balance support: Feet supported;Bilateral upper extremity supported Sitting balance-Leahy Scale: Poor Sitting  balance - Comments: worked EOB on balance >10 min.  Pt coughing consistently activating abdominals and causing strong forward lean, but pt able to work on episodes of midline upright sitting  with min guard to maximal assist.     Standing balance-Leahy Scale: Zero                              Cognition Arousal/Alertness: Lethargic Behavior During Therapy: Flat affect Overall Cognitive Status: Difficult to assess                                        Exercises      General Comments General comments (skin integrity, edema, etc.): vss on 30% FiO2, 5 peep, sats upper 90's      Pertinent Vitals/Pain Pain Assessment: Faces Faces Pain Scale: No hurt Pain Intervention(s): Monitored during session    Home Living                      Prior Function            PT Goals (current goals can now be found in the care plan section) Acute Rehab PT Goals PT Goal Formulation: With patient Time For Goal Achievement: 08/19/19 Potential to Achieve Goals: Fair Progress towards PT goals: Progressing toward goals    Frequency    Min 3X/week      PT Plan Current  plan remains appropriate;Frequency needs to be updated    Co-evaluation              AM-PAC PT "6 Clicks" Mobility   Outcome Measure  Help needed turning from your back to your side while in a flat bed without using bedrails?: Total Help needed moving from lying on your back to sitting on the side of a flat bed without using bedrails?: Total Help needed moving to and from a bed to a chair (including a wheelchair)?: Total Help needed standing up from a chair using your arms (e.g., wheelchair or bedside chair)?: Total Help needed to walk in hospital room?: Total Help needed climbing 3-5 steps with a railing? : Total 6 Click Score: 6    End of Session Equipment Utilized During Treatment: Oxygen Activity Tolerance: Patient tolerated treatment well;Patient limited by lethargy Patient  left: in bed;with call bell/phone within reach;with bed alarm set;with restraints reapplied;with SCD's reapplied Nurse Communication: Mobility status PT Visit Diagnosis: Muscle weakness (generalized) (M62.81);Difficulty in walking, not elsewhere classified (R26.2);Other abnormalities of gait and mobility (R26.89)     Time: 1035-1101 PT Time Calculation (min) (ACUTE ONLY): 26 min  Charges:  $Therapeutic Activity: 8-22 mins $Neuromuscular Re-education: 8-22 mins                     08/09/2019  Jacinto Halim., PT Acute Rehabilitation Services [phone]  (pager) [phone]  (office)   [name] [name] 08/09/2019, 11:27 AM

## 2019-08-10 LAB — CBC WITH DIFFERENTIAL/PLATELET
Abs Immature Granulocytes: 0.89 10*3/uL — ABNORMAL HIGH (ref 0.00–0.07)
Basophils Absolute: 0.1 10*3/uL (ref 0.0–0.1)
Basophils Relative: 1 %
Eosinophils Absolute: 0.6 10*3/uL — ABNORMAL HIGH (ref 0.0–0.5)
Eosinophils Relative: 4 %
HCT: 30.9 % — ABNORMAL LOW (ref 39.0–52.0)
Hemoglobin: 10 g/dL — ABNORMAL LOW (ref 13.0–17.0)
Immature Granulocytes: 5 %
Lymphocytes Relative: 18 %
Lymphs Abs: 2.9 10*3/uL (ref 0.7–4.0)
MCH: 28.2 pg (ref 26.0–34.0)
MCHC: 32.4 g/dL (ref 30.0–36.0)
MCV: 87.3 fL (ref 80.0–100.0)
Monocytes Absolute: 1.5 10*3/uL — ABNORMAL HIGH (ref 0.1–1.0)
Monocytes Relative: 9 %
Neutro Abs: 10.7 10*3/uL — ABNORMAL HIGH (ref 1.7–7.7)
Neutrophils Relative %: 63 %
Platelets: 611 10*3/uL — ABNORMAL HIGH (ref 150–400)
RBC: 3.54 MIL/uL — ABNORMAL LOW (ref 4.22–5.81)
RDW: 15.2 % (ref 11.5–15.5)
WBC: 16.7 10*3/uL — ABNORMAL HIGH (ref 4.0–10.5)
nRBC: 0.2 % (ref 0.0–0.2)

## 2019-08-10 LAB — COMPREHENSIVE METABOLIC PANEL WITH GFR
ALT: 114 U/L — ABNORMAL HIGH (ref 0–44)
AST: 82 U/L — ABNORMAL HIGH (ref 15–41)
Albumin: 1.5 g/dL — ABNORMAL LOW (ref 3.5–5.0)
Alkaline Phosphatase: 206 U/L — ABNORMAL HIGH (ref 38–126)
Anion gap: 10 (ref 5–15)
BUN: 21 mg/dL — ABNORMAL HIGH (ref 6–20)
CO2: 25 mmol/L (ref 22–32)
Calcium: 8.5 mg/dL — ABNORMAL LOW (ref 8.9–10.3)
Chloride: 112 mmol/L — ABNORMAL HIGH (ref 98–111)
Creatinine, Ser: 1.14 mg/dL (ref 0.61–1.24)
GFR calc Af Amer: >60 mL/min
GFR calc non Af Amer: >60 mL/min
Glucose, Bld: 111 mg/dL — ABNORMAL HIGH (ref 70–99)
Potassium: 3.6 mmol/L (ref 3.5–5.1)
Sodium: 147 mmol/L — ABNORMAL HIGH (ref 135–145)
Total Bilirubin: 1.2 mg/dL (ref 0.3–1.2)
Total Protein: 6.8 g/dL (ref 6.5–8.1)

## 2019-08-10 LAB — GLUCOSE, CAPILLARY
Glucose-Capillary: 100 mg/dL — ABNORMAL HIGH (ref 70–99)
Glucose-Capillary: 101 mg/dL — ABNORMAL HIGH (ref 70–99)
Glucose-Capillary: 103 mg/dL — ABNORMAL HIGH (ref 70–99)
Glucose-Capillary: 105 mg/dL — ABNORMAL HIGH (ref 70–99)
Glucose-Capillary: 108 mg/dL — ABNORMAL HIGH (ref 70–99)
Glucose-Capillary: 86 mg/dL (ref 70–99)

## 2019-08-10 LAB — MAGNESIUM: Magnesium: 1.6 mg/dL — ABNORMAL LOW (ref 1.7–2.4)

## 2019-08-10 MED ORDER — CLONIDINE HCL 0.3 MG PO TABS
0.5000 mg | ORAL_TABLET | Freq: Four times a day (QID) | ORAL | Status: DC
  Administered 2019-08-10 – 2019-08-22 (×46): 0.5 mg
  Filled 2019-08-10 (×47): qty 1

## 2019-08-10 MED ORDER — MAGNESIUM SULFATE 4 GM/100ML IV SOLN
4.0000 g | Freq: Once | INTRAVENOUS | Status: AC
  Administered 2019-08-10: 4 g via INTRAVENOUS
  Filled 2019-08-10: qty 100

## 2019-08-10 MED ORDER — QUETIAPINE FUMARATE 100 MG PO TABS
200.0000 mg | ORAL_TABLET | Freq: Two times a day (BID) | ORAL | Status: DC
  Administered 2019-08-10 – 2019-08-12 (×4): 200 mg
  Filled 2019-08-10 (×4): qty 2
  Filled 2019-08-10: qty 4

## 2019-08-10 MED ORDER — ACETYLCYSTEINE 20 % IN SOLN
3.0000 mL | Freq: Three times a day (TID) | RESPIRATORY_TRACT | Status: AC
  Administered 2019-08-11 (×3): 3 mL via RESPIRATORY_TRACT
  Filled 2019-08-10 (×3): qty 4

## 2019-08-10 MED ORDER — ACETYLCYSTEINE 20 % IN SOLN
4.0000 mL | Freq: Three times a day (TID) | RESPIRATORY_TRACT | Status: DC
  Administered 2019-08-10: 3 mL via RESPIRATORY_TRACT
  Filled 2019-08-10: qty 4

## 2019-08-10 NOTE — Progress Notes (Signed)
SLP Cancellation Note  Patient Details Name: Nivek Powley MRN: [id number] DOB: 06/15/1990   Cancelled treatment:        Seeing for appropriateness for an inline PMV. Noted in documentation that sedation is slowly being weaned however difficult to find balance between his agitation and somnolence. SLP attempted to wake with sternal rub in which he attempted to open eyes but could not. Not yet appropriate but will continue to follow.    [name] 08/10/2019, 1:55 PM  [name] [name].Ed Nurse, children's [phone] Office [phone]

## 2019-08-10 NOTE — Progress Notes (Signed)
eLink Physician-Brief Progress Note Patient Name: George Kelly DOB: 06/15/1990 MRN: [id number]   Date of Service  08/10/2019  HPI/Events of Note  Patient needs a.m. labs ordered.  eICU Interventions   a.m. labs ordered.        Thomasene Lot Melo Stauber 08/10/2019, 1:38 AM

## 2019-08-10 NOTE — Progress Notes (Signed)
eLink Physician-Brief Progress Note Patient Name: George Kelly DOB: 06/15/1990 MRN: [id number]   Date of Service  08/10/2019  HPI/Events of Note  Nursing reports thick secretions - Request for nebulized Mucomyst.  eICU Interventions  Will order: 1. Mucomyst 4 mL via neb along with Albuterol Neb now and TID.     Intervention Category Major Interventions: Other:  [name] 08/10/2019, 10:27 PM

## 2019-08-10 NOTE — Progress Notes (Signed)
Pharmacy Electrolyte Replacement  Recent Labs:  Recent Labs    08/10/19 0352  K 3.6  MG 1.6*  CREATININE 1.14    Low Critical Values (K </= 2.5, Phos </= 1, Mg </= 1) Present: None  MD Contacted: N/A  Plan:  Mg 4 gm x 1  Elmer Sow, PharmD, BCPS, BCCCP Clinical Pharmacist [phone]  Please check AMION for all [facility] numbers  08/10/2019 8:16 AM

## 2019-08-10 NOTE — Progress Notes (Signed)
Wasted 90 mL dilaudid in sink with Brynda Peon, RN.   Jacobo Forest, RN

## 2019-08-10 NOTE — Progress Notes (Signed)
NAME:  George Kelly, MRN:  [id number], DOB:  06/15/1990, LOS: 69 ADMISSION DATE:  07/25/2019, CONSULTATION DATE:  07/25/2019 REFERRING MD:  Dr. Langston Masker, CHIEF COMPLAINT:  Cardiac arrest    Brief History   29 yo male found unresponsive with probable drug overdose.  PEA arrest with ROSC after about 15 minutes.  Found to have lactic acidosis, AKI from rhabdomyolysis, aspiration pneumonia, shock liver, hypothermia.  UDS positive for cocaine, benzo's, THC.       Past Medical History  Substance abuse   Significant Hospital Events   6/09 Admit, start TTM and LTM 6/10 CRRT 6/11 off CRRT, rewarming from TTM 6/12 A fib with RVR >> amiodarone 6/25 attempting to wean off IV meds-Precedex-still no happy medium between sedation and agitation Consults:  Nephrology signed off 6/12  Procedures:  ETT 6/09 >>  Rt chest tube 6/09 >>  Lt radial a line 6/09 >>  Rt [place] CVL 6/09 >>  Rt femoral HD cath 6/09 >> 6/12  Significant Diagnostic Tests:   CT head 6/09 >> no acute findings  CT chest 6/09 >> 10% Rt PTX, diffuse tree in bud and nodular opacities, debris within b/l mainstem bronchi, mucous plugging lower lobes  CT abd/pelvis 6/09 >> normal  Renal u/s 6/10 >> echogenic kidney b/l  Echo 6/10 >> EF 55 to 60%, grade 2 DD  cth 6/14: No acute intracranial abnormality.  Ct chest abd pelvis 6/14: Extensive bilateral airspace opacities, worsening since prior study. Dense consolidation in both lower lobes with small bilateral effusions. Right chest tube in place. Small residual right anterior pneumothorax. No acute findings in the abdomen or pelvis. Chest x-ray 6/24-bibasal atelectasis, small effusions-stable from prior  Micro Data:  COVID 6/9 > negative Blood culture 6/9 > lactobacillus Urine culture 6/9 > negative 6/14 blood: ngtd 6/15 resp: enterobacter Blood culture 6/21-no growth so far Tracheal aspirate 6/21-no growth so far  Antimicrobials:  Zosyn 6/09 - 6/14 Vancomycin 6/09  -6/10 Zyvox 6/11 linezolid 6/15-6/16 merrem 6/15-6/16 Cefepime 6/17-6/23  Interim history/subjective:  Tracheostomy in place.More calm overnight Fever trend is down Leukocytosis is stable Off antibiotics at present  Objective   Blood pressure (!) 142/102, pulse 71, temperature 97.9 F (36.6 C), temperature source Oral, resp. rate (!) 27, height 6\' 1"  (1.854 m), weight 85.2 kg, SpO2 100 %.    Vent Mode: PCV FiO2 (%):  [30 %] 30 % Set Rate:  [26 bmp] 26 bmp PEEP:  [5 cmH20] 5 cmH20 Pressure Support:  [5 cmH20] 5 cmH20 Plateau Pressure:  [14 cmH20-23 cmH20] 22 cmH20   Intake/Output Summary (Last 24 hours) at 08/10/2019 0911 Last data filed at 08/10/2019 0800 Gross per 24 hour  Intake 4277.91 ml  Output 1275 ml  Net 3002.91 ml   Filed Weights   08/08/19 0500 08/09/19 0220 08/10/19 0142  Weight: 81.7 kg 85.4 kg 85.2 kg    Examination:  GEN: On vent, did not do well with weaning HEENT: Significant secretions via trach CV: S1-S2 appreciated PULM: Bilateral rhonchi GI: Soft, +BS EXT: No edema NEURO: Moves all extremities PSYCH: RASS -1 SKIN: No rashes   Resolved/Stable Hospital Problem list   Hyperkalemia, Anion gap metabolic acidosis with lactic acidosis, Hypoglycemia Shock Reactive Afib/RVR Thrombocytopenia Anemia Transaminitis Gastritis  Assessment & Plan:   Critically ill due to acute hypoxic respiratory failure from cardiac arrest and aspiration pneumonia. - s/p trach 6/17 due to delirium/encephalopathy and secretion clearance issues - Daily pressure support and trach collar trials-has not been tolerating this so far -  PT/OT/SLP consults appreciated -Dilaudid discontinued, on Precedex-trying to wean Precedex -Will increase dose of Seroquel and clonidine  Enterobacter pneumonia -Completed cefepime for 7 days, stopped 6/23 -Still having fevers-cultures negative -Recent tracheal aspirate-negative, blood cultures negative -Continue to monitor off  antibiotics  New fever 103 6/21, 101 Fever trend is better -Blood cultures negative so far -Tracheal aspirate with negative cultures so far -Possible central fever -Ibuprofen 400 q. 8 as needed for 100.3  Rt pneumothorax after CPR no recurrence since chest tube removed. - f/u CXR periodically -bilateral atelectasis -bilateral effusions-effusions remain stable on chest x-ray  AKI from rhabdomyolysis. Hypokalemia. - renal fx stable - ck normalized - cont to follow uop and renal indices  Hypernatremia:  -Still elevated-improving -Free water increased to 500 every 4  Acute metabolic encephalopathy from anoxia, sepsis, renal failure. -Continues on Klonopin, duragesic, -discontinue precedex, dilaudid-weaned off  Polysubstance abuse. 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 -Propranolol and clonidine for central storming.  Propofol discontinued On clonidine-dose increased 2.5 twice daily  replaced Dilaudid with fentanyl patch Wean off Precedex  Best practice:  Diet: resume tube feeds DVT prophylaxis: lovenox GI prophylaxis: protonix Mobility: As tolerated Code Status: Full Disposition: ICU  Updated family at bedside  The patient is critically ill with multiple organ systems failure and requires high complexity decision making for assessment and support, frequent evaluation and titration of therapies, application of advanced monitoring technologies and extensive interpretation of multiple databases. Critical Care Time devoted to patient care services described in this note independent of APP/resident time (if applicable)  is 33 minutes.   Virl Diamond MD [facility] Personal pager: [phone] If unanswered, please page CCM On-call: #[phone]

## 2019-08-11 LAB — COMPREHENSIVE METABOLIC PANEL
ALT: 90 U/L — ABNORMAL HIGH (ref 0–44)
AST: 62 U/L — ABNORMAL HIGH (ref 15–41)
Albumin: 1.4 g/dL — ABNORMAL LOW (ref 3.5–5.0)
Alkaline Phosphatase: 212 U/L — ABNORMAL HIGH (ref 38–126)
Anion gap: 10 (ref 5–15)
BUN: 22 mg/dL — ABNORMAL HIGH (ref 6–20)
CO2: 25 mmol/L (ref 22–32)
Calcium: 8.2 mg/dL — ABNORMAL LOW (ref 8.9–10.3)
Chloride: 105 mmol/L (ref 98–111)
Creatinine, Ser: 1.13 mg/dL (ref 0.61–1.24)
GFR calc Af Amer: >60 mL/min (ref >60)
GFR calc non Af Amer: >60 mL/min (ref >60)
Glucose, Bld: 100 mg/dL — ABNORMAL HIGH (ref 70–99)
Potassium: 3.7 mmol/L (ref 3.5–5.1)
Sodium: 140 mmol/L (ref 135–145)
Total Bilirubin: 0.9 mg/dL (ref 0.3–1.2)
Total Protein: 6.5 g/dL (ref 6.5–8.1)

## 2019-08-11 LAB — CBC WITH DIFFERENTIAL/PLATELET
Abs Immature Granulocytes: 0.67 10*3/uL — ABNORMAL HIGH (ref 0.00–0.07)
Basophils Absolute: 0.1 10*3/uL (ref 0.0–0.1)
Basophils Relative: 0 %
Eosinophils Absolute: 0.5 10*3/uL (ref 0.0–0.5)
Eosinophils Relative: 3 %
HCT: 27.3 % — ABNORMAL LOW (ref 39.0–52.0)
Hemoglobin: 9 g/dL — ABNORMAL LOW (ref 13.0–17.0)
Immature Granulocytes: 5 %
Lymphocytes Relative: 19 %
Lymphs Abs: 2.6 10*3/uL (ref 0.7–4.0)
MCH: 28 pg (ref 26.0–34.0)
MCHC: 33 g/dL (ref 30.0–36.0)
MCV: 85 fL (ref 80.0–100.0)
Monocytes Absolute: 1.1 10*3/uL — ABNORMAL HIGH (ref 0.1–1.0)
Monocytes Relative: 8 %
Neutro Abs: 8.8 10*3/uL — ABNORMAL HIGH (ref 1.7–7.7)
Neutrophils Relative %: 65 %
Platelets: 532 10*3/uL — ABNORMAL HIGH (ref 150–400)
RBC: 3.21 MIL/uL — ABNORMAL LOW (ref 4.22–5.81)
RDW: 14.7 % (ref 11.5–15.5)
WBC: 13.7 10*3/uL — ABNORMAL HIGH (ref 4.0–10.5)
nRBC: 0.2 % (ref 0.0–0.2)

## 2019-08-11 LAB — POCT I-STAT 7, (LYTES, BLD GAS, ICA,H+H)
Acid-Base Excess: 4 mmol/L — ABNORMAL HIGH (ref 0.0–2.0)
Bicarbonate: 28.1 mmol/L — ABNORMAL HIGH (ref 20.0–28.0)
Calcium, Ion: 1.26 mmol/L (ref 1.15–1.40)
HCT: 29 % — ABNORMAL LOW (ref 39.0–52.0)
Hemoglobin: 9.9 g/dL — ABNORMAL LOW (ref 13.0–17.0)
O2 Saturation: 96 %
Patient temperature: 99.2
Potassium: 3.7 mmol/L (ref 3.5–5.1)
Sodium: 143 mmol/L (ref 135–145)
TCO2: 29 mmol/L (ref 22–32)
pCO2 arterial: 41.8 mmHg (ref 32.0–48.0)
pH, Arterial: 7.438 (ref 7.350–7.450)
pO2, Arterial: 84 mmHg (ref 83.0–108.0)

## 2019-08-11 LAB — CULTURE, BLOOD (ROUTINE X 2)
Culture: NO GROWTH
Culture: NO GROWTH
Special Requests: ADEQUATE
Special Requests: ADEQUATE

## 2019-08-11 LAB — GLUCOSE, CAPILLARY
Glucose-Capillary: 102 mg/dL — ABNORMAL HIGH (ref 70–99)
Glucose-Capillary: 109 mg/dL — ABNORMAL HIGH (ref 70–99)
Glucose-Capillary: 84 mg/dL (ref 70–99)
Glucose-Capillary: 88 mg/dL (ref 70–99)
Glucose-Capillary: 93 mg/dL (ref 70–99)
Glucose-Capillary: 97 mg/dL (ref 70–99)

## 2019-08-11 MED ORDER — POLYETHYLENE GLYCOL 3350 17 G PO PACK
17.0000 g | PACK | Freq: Every day | ORAL | Status: DC | PRN
  Administered 2019-08-30: 17 g

## 2019-08-11 MED ORDER — FREE WATER
300.0000 mL | Status: DC
  Administered 2019-08-11 – 2019-08-14 (×18): 300 mL

## 2019-08-11 MED ORDER — IBUPROFEN 100 MG/5ML PO SUSP
400.0000 mg | Freq: Three times a day (TID) | ORAL | Status: DC | PRN
  Administered 2019-08-12 (×2): 400 mg
  Filled 2019-08-11 (×2): qty 20

## 2019-08-11 NOTE — Progress Notes (Signed)
eLink Physician-Brief Progress Note Patient Name: George Kelly DOB: 06/15/1990 MRN: [id number]   Date of Service  08/11/2019  HPI/Events of Note  Nursing request for CBC with platelets and CMP in AM.  eICU Interventions  Will order CBC with platelets and CMP at 5 AM.      Intervention Category Major Interventions: Other:  [name] Dennard Nip 08/11/2019, 4:20 AM

## 2019-08-11 NOTE — Progress Notes (Addendum)
NAME:  George Kelly, MRN:  [id number], DOB:  06/15/1990, LOS: 34 ADMISSION DATE:  07/25/2019, CONSULTATION DATE:  07/25/2019 REFERRING MD:  Dr. Langston Masker, CHIEF COMPLAINT:  Cardiac arrest    Brief History   29 yo male found unresponsive with probable drug overdose.  PEA arrest with ROSC after about 15 minutes.  Found to have lactic acidosis, AKI from rhabdomyolysis, aspiration pneumonia, shock liver, hypothermia.  UDS positive for cocaine, benzo's, THC.       Past Medical History  Substance abuse   Significant Hospital Events   6/09 Admit, start TTM and LTM 6/10 CRRT 6/11 off CRRT, rewarming from TTM 6/12 A fib with RVR >> amiodarone 6/25 attempting to wean off IV meds-Precedex-still no happy medium between sedation and agitation  Consults:  Nephrology - signed off 6/12  Procedures:  ETT 6/09 >> 6/21 Rt chest tube 6/09 >> out Lt radial a line 6/09 >> out  Rt  CVL 6/09 >> 6/23  Rt femoral HD cath 6/09 >> 6/12 LUE PICC 6/23 >> Trach 6/21 >>   Significant Diagnostic Tests:   CT head 6/09 >> no acute findings  CT chest 6/09 >> 10% Rt PTX, diffuse tree in bud and nodular opacities, debris within b/l mainstem bronchi, mucous plugging lower lobes  CT abd/pelvis 6/09 >> normal  Renal u/s 6/10 >> echogenic kidney b/l  Echo 6/10 >> EF 55 to 60%, grade 2 DD  CT Head 6/14 >> No acute intracranial abnormality.  CT Chest, abd, pelvis 6/14 >> Extensive bilateral airspace opacities, worsening since prior study.Dense consolidation in both lower lobes with small bilateral effusions. Right chest tube in place. Small residual right anterior pneumothorax. No acute findings in the abdomen or pelvis.   Micro Data:  COVID 6/9 > negative Blood culture 6/9 > lactobacillus Urine culture 6/9 > negative BCx2 6/14 >> negative Resp 6/15 >> enterobacter BC 6/21 >> negative Tracheal aspirate 6/21 >> negative  Antimicrobials:  Zosyn 6/09 >> 6/14 Vancomycin 6/09 >> 6/10 Zyvox 6/11 linezolid  6/15 >> 6/16 merrem 6/15 >> 6/16 Cefepime 6/17 >> 6/23  Interim history/subjective:  RN reports pt either very agitated / unable to re-direct or sedate Remains on PCV - 30% FiO2, PEEP 5 Glucose range 90-100's  I/O - 2.8L UOP, -252 in last 24 hours  Objective   Blood pressure (!) 142/95, pulse 71, temperature 99.2 F (37.3 C), temperature source Oral, resp. rate (!) 26, height 6\' 1"  (1.854 m), weight 85.2 kg, SpO2 100 %.    Vent Mode: PCV FiO2 (%):  [30 %] 30 % Set Rate:  [26 bmp] 26 bmp PEEP:  [5 cmH20] 5 cmH20 Plateau Pressure:  [15 UVO53-66 cmH20] 15 cmH20   Intake/Output Summary (Last 24 hours) at 08/11/2019 4403 Last data filed at 08/11/2019 4742 Gross per 24 hour  Intake 2573.96 ml  Output 3310 ml  Net -736.04 ml   Filed Weights   08/08/19 0500 08/09/19 0220 08/10/19 0142  Weight: 81.7 kg 85.4 kg 85.2 kg    Examination: General: ill appearing young adult male lying in bed on vent HEENT: MM pink/moist, #8 trach midline c/d/i, cortrack in place Neuro: sedate, no response to verbal stimuli CV: s1s2 rrr, no m/r/g PULM: non-labored on PCV, lungs bilaterally clear  GI: soft, bsx4 active  Extremities: warm/dry, no edema  Skin: no rashes or lesions  Resolved/Stable Hospital Problem list   Hyperkalemia, Anion gap metabolic acidosis with lactic acidosis, Hypoglycemia Shock Reactive Afib/RVR Thrombocytopenia Anemia Transaminitis Gastritis  Assessment & Plan:  Acute Hypoxic Respiratory Failure in setting of Cardiac Arrest and Aspiration Pneumonia S/p trach 6/17 due to delirium/encephalopathy and secretion clearance issues -PCV, reduce rate to 18 with follow up ABG  -PSV trials as tolerated with goal for ATC  -follow intermittent CXR -add stop time to mucinex  Enterobacter Pneumonia Completed cefepime for 7 days, stopped 6/23 -intermittent fevers, culture negative  -follow off abx   Intermittent Fever  103 6/21. Fever trend is better.  Culture negative.     -follow fever curve  -? Central fever  -PRN ibuprofen   Rt Pneumothorax s/p CPR  CXR stable post removal.  -follow intermittent CXR   AKI in setting of Rhabdomyolysis Hypokalemia CK normalized.   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

## 2019-08-12 ENCOUNTER — Inpatient Hospital Stay (HOSPITAL_COMMUNITY): Payer: Self-pay

## 2019-08-12 LAB — COMPREHENSIVE METABOLIC PANEL
ALT: 71 U/L — ABNORMAL HIGH (ref 0–44)
AST: 56 U/L — ABNORMAL HIGH (ref 15–41)
Albumin: 1.4 g/dL — ABNORMAL LOW (ref 3.5–5.0)
Alkaline Phosphatase: 197 U/L — ABNORMAL HIGH (ref 38–126)
Anion gap: 9 (ref 5–15)
BUN: 20 mg/dL (ref 6–20)
CO2: 26 mmol/L (ref 22–32)
Calcium: 7.9 mg/dL — ABNORMAL LOW (ref 8.9–10.3)
Chloride: 105 mmol/L (ref 98–111)
Creatinine, Ser: 1.05 mg/dL (ref 0.61–1.24)
GFR calc Af Amer: >60 mL/min (ref >60)
GFR calc non Af Amer: >60 mL/min (ref >60)
Glucose, Bld: 102 mg/dL — ABNORMAL HIGH (ref 70–99)
Potassium: 3.5 mmol/L (ref 3.5–5.1)
Sodium: 140 mmol/L (ref 135–145)
Total Bilirubin: 0.8 mg/dL (ref 0.3–1.2)
Total Protein: 6.5 g/dL (ref 6.5–8.1)

## 2019-08-12 LAB — CBC
HCT: 26.6 % — ABNORMAL LOW (ref 39.0–52.0)
Hemoglobin: 8.8 g/dL — ABNORMAL LOW (ref 13.0–17.0)
MCH: 28.2 pg (ref 26.0–34.0)
MCHC: 33.1 g/dL (ref 30.0–36.0)
MCV: 85.3 fL (ref 80.0–100.0)
Platelets: 469 10*3/uL — ABNORMAL HIGH (ref 150–400)
RBC: 3.12 MIL/uL — ABNORMAL LOW (ref 4.22–5.81)
RDW: 14.7 % (ref 11.5–15.5)
WBC: 13.3 10*3/uL — ABNORMAL HIGH (ref 4.0–10.5)
nRBC: 0.2 % (ref 0.0–0.2)

## 2019-08-12 LAB — GLUCOSE, CAPILLARY
Glucose-Capillary: 103 mg/dL — ABNORMAL HIGH (ref 70–99)
Glucose-Capillary: 104 mg/dL — ABNORMAL HIGH (ref 70–99)
Glucose-Capillary: 110 mg/dL — ABNORMAL HIGH (ref 70–99)
Glucose-Capillary: 117 mg/dL — ABNORMAL HIGH (ref 70–99)
Glucose-Capillary: 119 mg/dL — ABNORMAL HIGH (ref 70–99)
Glucose-Capillary: 82 mg/dL (ref 70–99)

## 2019-08-12 MED ORDER — IBUPROFEN 100 MG/5ML PO SUSP
400.0000 mg | Freq: Four times a day (QID) | ORAL | Status: DC | PRN
  Administered 2019-08-12 – 2019-08-13 (×3): 400 mg
  Filled 2019-08-12 (×4): qty 20

## 2019-08-12 MED ORDER — QUETIAPINE FUMARATE 100 MG PO TABS
400.0000 mg | ORAL_TABLET | Freq: Two times a day (BID) | ORAL | Status: DC
  Administered 2019-08-12 – 2019-08-23 (×22): 400 mg
  Filled 2019-08-12 (×4): qty 4
  Filled 2019-08-12: qty 8
  Filled 2019-08-12: qty 4
  Filled 2019-08-12: qty 8
  Filled 2019-08-12 (×2): qty 4
  Filled 2019-08-12: qty 8
  Filled 2019-08-12 (×2): qty 4
  Filled 2019-08-12: qty 8
  Filled 2019-08-12 (×2): qty 4
  Filled 2019-08-12: qty 8
  Filled 2019-08-12 (×4): qty 4
  Filled 2019-08-12: qty 8
  Filled 2019-08-12 (×2): qty 4

## 2019-08-12 MED ORDER — QUETIAPINE FUMARATE 100 MG PO TABS
200.0000 mg | ORAL_TABLET | Freq: Once | ORAL | Status: AC
  Administered 2019-08-12: 200 mg via ORAL
  Filled 2019-08-12: qty 2

## 2019-08-12 NOTE — Progress Notes (Signed)
eLink Physician-Brief Progress Note Patient Name: George Kelly DOB: 06/15/1990 MRN: [id number]   Date of Service  08/12/2019  HPI/Events of Note  Fever to 102.4 F --> now 101.0 F s/p Mortin and ice packs. Request to make Motrin Q 6 hours from Q 8 hours. Creatinine = 1.05.   eICU Interventions  Plan: 1. Increase Motrin 400 mg per tube to Q 6 hours PRN.      Intervention Category Major Interventions: Other:  [name] 08/12/2019, 9:59 PM

## 2019-08-12 NOTE — Progress Notes (Signed)
NAME:  George Kelly, MRN:  [id number], DOB:  06/15/1990, LOS: 18 ADMISSION DATE:  07/25/2019, CONSULTATION DATE:  07/25/2019 REFERRING MD:  Dr. Renaye Rakers, CHIEF COMPLAINT:  Cardiac arrest    Brief History   29 yo male found unresponsive with probable drug overdose.  PEA arrest with ROSC after about 15 minutes.  Found to have lactic acidosis, AKI from rhabdomyolysis, aspiration pneumonia, shock liver, hypothermia.  UDS positive for cocaine, benzo's, THC.       Past Medical History  Substance abuse   Significant Hospital Events   6/09 Admit, start TTM and LTM 6/10 CRRT 6/11 off CRRT, rewarming from TTM 6/12 A fib with RVR >> amiodarone 6/25 attempting to wean off IV meds-Precedex-still no happy medium between sedation and agitation  Consults:  Nephrology - signed off 6/12  Procedures:  ETT 6/09 >> 6/21 Rt chest tube 6/09 >> out Lt radial a line 6/09 >> out  Rt [place] CVL 6/09 >> 6/23  Rt femoral HD cath 6/09 >> 6/12 LUE PICC 6/23 >> Trach 6/21 >>   Significant Diagnostic Tests:   CT head 6/09 >> no acute findings  CT chest 6/09 >> 10% Rt PTX, diffuse tree in bud and nodular opacities, debris within b/l mainstem bronchi, mucous plugging lower lobes  CT abd/pelvis 6/09 >> normal  Renal u/s 6/10 >> echogenic kidney b/l  Echo 6/10 >> EF 55 to 60%, grade 2 DD  CT Head 6/14 >> No acute intracranial abnormality.  CT Chest, abd, pelvis 6/14 >> Extensive bilateral airspace opacities, worsening since prior study.Dense consolidation in both lower lobes with small bilateral effusions. Right chest tube in place. Small residual right anterior pneumothorax. No acute findings in the abdomen or pelvis.   Micro Data:  COVID 6/9 > negative Blood culture 6/9 > lactobacillus Urine culture 6/9 > negative BCx2 6/14 >> negative Resp 6/15 >> enterobacter BC 6/21 >> negative Tracheal aspirate 6/21 >> negative  Antimicrobials:  Zosyn 6/09 >> 6/14 Vancomycin 6/09 >> 6/10 Zyvox 6/11 linezolid  6/15 >> 6/16 merrem 6/15 >> 6/16 Cefepime 6/17 >> 6/23  Interim history/subjective:  Pulled out rectal tube yesterday. Sleeping today. Good diuresis.  Objective   Blood pressure 127/77, pulse 95, temperature (!) 96.9 F (36.1 C), temperature source Axillary, resp. rate 19, height 6\' 1"  (1.854 m), weight 85.2 kg, SpO2 96 %.    Vent Mode: CPAP;PSV FiO2 (%):  [30 %] 30 % Set Rate:  [22 bmp-26 bmp] 26 bmp PEEP:  [5 cmH20] 5 cmH20 Pressure Support:  [8 cmH20-16 cmH20] 15 cmH20 Plateau Pressure:  [19 cmH20-21 cmH20] 21 cmH20   Intake/Output Summary (Last 24 hours) at 08/12/2019 1027 Last data filed at 08/12/2019 1000 Gross per 24 hour  Intake 1946.81 ml  Output 3970 ml  Net -2023.19 ml   Filed Weights   08/08/19 0500 08/09/19 0220 08/10/19 0142  Weight: 81.7 kg 85.4 kg 85.2 kg    Examination: GEN: young man in NAD HEENT: Trach in place, small mucoid secretions CV: RRR, ext warm PULM: Scattered rhonci, no accessory muscle use GI: Soft, +BS EXT: no edema NEURO: moves all 4 ext to command PSYCH: sleeping today SKIN: no rashes   Resolved/Stable Hospital Problem list   Hyperkalemia, Anion gap metabolic acidosis with lactic acidosis, Hypoglycemia Shock Reactive Afib/RVR Thrombocytopenia Anemia Transaminitis Gastritis AKI Hypernatremia  Assessment & Plan:   Acute Hypoxic Respiratory Failure in setting of Cardiac Arrest and Aspiration Pneumonia S/p trach 6/17 due to delirium/encephalopathy and secretion clearance issues. - Seems to  be auto-diuresing, will leave alone for now - PS trials as able  Enterobacter Pneumonia Completed cefepime for 7 days, stopped 6/23 -intermittent fevers, culture negative -follow off abx   Intermittent Fever  103 6/21. Fever trend is better.  Culture negative.   -follow fever curve  -? Central fever  -PRN ibuprofen   Rt Pneumothorax s/p CPR  CXR stable post removal.  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 -continue propranolol, clonidine for central storm   Severe Protein Calorie Malnutrition  -TF per Nutrition    Best practice:  Diet:  TF DVT prophylaxis: lovenox GI prophylaxis: protonix Mobility: As tolerated Code Status: Full Disposition: ICU  Family Update: Mother updated via phone 6/26 by [facility]  The patient is critically ill with multiple organ systems failure and requires high complexity decision making for assessment and support, frequent evaluation and titration of therapies, application of advanced monitoring technologies and extensive interpretation of multiple databases. Critical Care Time devoted to patient care services described in this note independent of APP/resident time (if applicable)  is 35 minutes.   Erskine Emery MD [facility] 08/12/2019 10:41 AM Personal pager: [phone] If unanswered, please page CCM On-call: [phone]

## 2019-08-13 LAB — GLUCOSE, CAPILLARY
Glucose-Capillary: 108 mg/dL — ABNORMAL HIGH (ref 70–99)
Glucose-Capillary: 89 mg/dL (ref 70–99)
Glucose-Capillary: 94 mg/dL (ref 70–99)
Glucose-Capillary: 97 mg/dL (ref 70–99)
Glucose-Capillary: 98 mg/dL (ref 70–99)
Glucose-Capillary: 99 mg/dL (ref 70–99)

## 2019-08-13 MED ORDER — CLONAZEPAM 1 MG PO TABS
4.0000 mg | ORAL_TABLET | Freq: Two times a day (BID) | ORAL | Status: DC
  Administered 2019-08-13 – 2019-08-17 (×8): 4 mg
  Filled 2019-08-13 (×8): qty 4

## 2019-08-13 NOTE — Plan of Care (Signed)
  Problem: Health Behavior/Discharge Planning: Goal: Ability to manage health-related needs will improve Outcome: Progressing   Problem: Clinical Measurements: Goal: Ability to maintain clinical measurements within normal limits will improve Outcome: Progressing Goal: Will remain free from infection Outcome: Progressing Goal: Diagnostic test results will improve Outcome: Progressing Goal: Respiratory complications will improve Outcome: Progressing Goal: Cardiovascular complication will be avoided Outcome: Progressing   Problem: Activity: Goal: Risk for activity intolerance will decrease Outcome: Progressing   Problem: Nutrition: Goal: Adequate nutrition will be maintained Outcome: Progressing   Problem: Coping: Goal: Level of anxiety will decrease Outcome: Progressing   Problem: Elimination: Goal: Will not experience complications related to bowel motility Outcome: Progressing Goal: Will not experience complications related to urinary retention Outcome: Progressing   Problem: Pain Managment: Goal: General experience of comfort will improve Outcome: Progressing   Problem: Safety: Goal: Ability to remain free from injury will improve Outcome: Progressing   Problem: Skin Integrity: Goal: Risk for impaired skin integrity will decrease Outcome: Progressing   Problem: Activity: Goal: Ability to tolerate increased activity will improve Outcome: Progressing   Problem: Respiratory: Goal: Ability to maintain a clear airway and adequate ventilation will improve Outcome: Progressing   Problem: Role Relationship: Goal: Method of communication will improve Outcome: Progressing   Problem: Activity: Goal: Ability to tolerate increased activity will improve Outcome: Progressing   Problem: Respiratory: Goal: Ability to maintain a clear airway and adequate ventilation will improve Outcome: Progressing   Problem: Role Relationship: Goal: Method of communication will  improve Outcome: Progressing   

## 2019-08-13 NOTE — Progress Notes (Signed)
Occupational Therapy Treatment Patient Details Name: George Kelly MRN: [id number] DOB: 06/15/1990 Today's Date: 08/13/2019    History of present illness 29 yo male found unresponsive with probable drug overdose.  PEA arrest with ROSC after about 15 minutes.  Found to have lactic acidosis, AKI from rhabdomyolysis, aspiration pneumonia, shock liver, hypothermia.  UDS positive for cocaine, benzo's, THC.   6/17 tracheostomy.   OT comments  Pt continues on vent with mild sedation, as pt continues to become agitated during weaning. Pt lethargic during session, intermittently with eyes opened. Sat EOB for with maxA for postural control and weight shifting. Pt not demonstrating protective reactions or righting response. Pt requires totalA for all ADL. Pt left in recliner, with RN permission, restraints reapplied and chair alarm belt secure. Pt will continue to benefit from skilled OT services to maximize safety and independence with ADL/IADL and functional mobility. Will continue to follow acutely and progress as tolerated.    Follow Up Recommendations  CIR    Equipment Recommendations  Other (comment) (TBD)    Recommendations for Other Services      Precautions / Restrictions Precautions Precautions: Fall Precaution Comments: 4 pt restraints, flexi, condom cath and NG Restrictions Weight Bearing Restrictions: No       Mobility Bed Mobility Overal bed mobility: Needs Assistance Bed Mobility: Supine to Sit     Supine to sit: Total assist;Max assist     General bed mobility comments: pt attempting to progress trunk to upright position when restless, otherwise no purposeful movement to command/cues  Transfers Overall transfer level: Needs assistance Equipment used:  (face to face transfer) Transfers: Sit to/from [employer] Sit to Stand: Total assist Stand pivot transfers: Total assist       General transfer comment: face to face transfer from EOB, pt  required totalA to powerup into standing, once in standing pt appeared to tolerate small amount of weight bearing    Balance Overall balance assessment: Needs assistance Sitting-balance support: Feet supported;Bilateral upper extremity supported Sitting balance-Leahy Scale: Poor Sitting balance - Comments: worked on sitting balance, protective responses and postural correction sitting EOB for about 90minutes;pt needing maxA to correct posture, pt with truncal reaction to midline with poor coordination Postural control: Posterior lean   Standing balance-Leahy Scale: Zero                             ADL either performed or assessed with clinical judgement   ADL Overall ADL's : Needs assistance/impaired                                       General ADL Comments: continues to require totalA for all aspects of ADL;pt sat EOB with maxA, tolerated sitting EOB for 10 minutes with stable vital signs     Vision       Perception     Praxis      Cognition Arousal/Alertness: Lethargic Behavior During Therapy: Flat affect Overall Cognitive Status: Difficult to assess                                 General Comments: spontaneously opening his eyes for short duration;not following commands;not agitated during session        Exercises     Shoulder Instructions  General Comments vss on 30% FiO2;frequent coughing with minimal secretions in trach    Pertinent Vitals/ Pain       Pain Assessment: Faces Faces Pain Scale: No hurt Pain Intervention(s): Monitored during session  Home Living                                          Prior Functioning/Environment              Frequency  Min 2X/week        Progress Toward Goals  OT Goals(current goals can now be found in the care plan section)  Progress towards OT goals: Progressing toward goals  Acute Rehab OT Goals Patient Stated Goal: none stated OT Goal  Formulation: Patient unable to participate in goal setting Potential to Achieve Goals: Fair ADL Goals Pt Will Perform Grooming: with min assist;sitting Pt/caregiver will Perform Home Exercise Program: Increased strength;Increased ROM;Both right and left upper extremity;With minimal assist;With written HEP provided Additional ADL Goal #1: Pt will tolerate sitting EOB >7 min with minA as precursor to EOB/OOB ADL. Additional ADL Goal #2: Pt will follow 1 step simple command with 75% accuracy during ADL/functional task.  Plan Discharge plan remains appropriate    Co-evaluation    PT/OT/SLP Co-Evaluation/Treatment: Yes Reason for Co-Treatment: Complexity of the patient's impairments (multi-system involvement);For patient/therapist safety;To address functional/ADL transfers   OT goals addressed during session: ADL's and self-care      AM-PAC OT "6 Clicks" Daily Activity     Outcome Measure   Help from another person eating meals?: Total Help from another person taking care of personal grooming?: Total Help from another person toileting, which includes using toliet, bedpan, or urinal?: Total Help from another person bathing (including washing, rinsing, drying)?: Total Help from another person to put on and taking off regular upper body clothing?: Total Help from another person to put on and taking off regular lower body clothing?: Total 6 Click Score: 6    End of Session Equipment Utilized During Treatment: Oxygen  OT Visit Diagnosis: Other abnormalities of gait and mobility (R26.89);Muscle weakness (generalized) (M62.81);Other symptoms and signs involving cognitive function   Activity Tolerance Patient limited by lethargy   Patient Left in chair;with call bell/phone within reach;with chair alarm set;with restraints reapplied;Other (comment) (posey chair alarm )   Nurse Communication Mobility status        Time: 1121-1200 OT Time Calculation (min): 39 min  Charges: OT General  Charges $OT Visit: 1 Visit OT Treatments $Self Care/Home Management : 23-37 mins  Helene Kelp OTR/L Acute Rehabilitation Services Office: [place] 08/13/2019, 12:25 PM

## 2019-08-13 NOTE — Progress Notes (Signed)
Physical Therapy Treatment Patient Details Name: George Kelly MRN: [id number] DOB: 06/15/1990 Today's Date: 08/13/2019    History of Present Illness 29 yo male found unresponsive with probable drug overdose.  PEA arrest with ROSC after about 15 minutes.  Found to have lactic acidosis, AKI from rhabdomyolysis, aspiration pneumonia, shock liver, hypothermia.  UDS positive for cocaine, benzo's, THC.   6/17 tracheostomy.    PT Comments    Pt still appearing sedated.  He does open his eyes and makes attempts to assist, but loses focus and "shuts down" frequently also.  Emphasis on transitions, sitting balance, truncal activation activities, sit to stand and transfers.    Follow Up Recommendations  CIR;Supervision/Assistance - 24 hour     Equipment Recommendations  Other (comment)    Recommendations for Other Services Rehab consult     Precautions / Restrictions Precautions Precautions: Fall Precaution Comments: 4 pt restraints, flexi, condom cath and NG    Mobility  Bed Mobility Overal bed mobility: Needs Assistance Bed Mobility: Supine to Sit     Supine to sit: Total assist;Max assist     General bed mobility comments: pt attempting to progress trunk to upright position when restless, otherwise no purposeful movement to command/cues  Transfers Overall transfer level: Needs assistance Equipment used:  (face to face transfer) Transfers: Sit to/from [employer] Sit to Stand: Total assist Stand pivot transfers: Total assist       General transfer comment: face to face transfer from EOB, pt required totalA to powerup into standing, once in standing pt appeared to tolerate small amount of weight bearing  Ambulation/Gait                 Stairs             Wheelchair Mobility    Modified Rankin (Stroke Patients Only)       Balance Overall balance assessment: Needs assistance Sitting-balance support: Feet supported;Bilateral upper  extremity supported Sitting balance-Leahy Scale: Poor Sitting balance - Comments: worked on sitting balance, protective responses and postural correction sitting EOB for about 58minutes;pt needing maxA to correct posture, pt with truncal reaction to midline with poor coordination Postural control: Posterior lean   Standing balance-Leahy Scale: Zero                              Cognition Arousal/Alertness: Lethargic Behavior During Therapy: Flat affect Overall Cognitive Status: Difficult to assess                                 General Comments: spontaneously opening his eyes for short duration;not following commands;not agitated during session      Exercises      General Comments General comments (skin integrity, edema, etc.): vss on 30% FiO2;frequent coughing with minimal secretions in trach      Pertinent Vitals/Pain Pain Assessment: Faces Faces Pain Scale: No hurt Pain Intervention(s): Monitored during session    Home Living                      Prior Function            PT Goals (current goals can now be found in the care plan section) Acute Rehab PT Goals Patient Stated Goal: none stated PT Goal Formulation: With patient Time For Goal Achievement: 08/19/19 Potential to Achieve Goals: Fair Progress towards PT goals: Progressing  toward goals (slow changes)    Frequency    Min 3X/week      PT Plan Current plan remains appropriate;Frequency needs to be updated    Co-evaluation PT/OT/SLP Co-Evaluation/Treatment: Yes Reason for Co-Treatment: Complexity of the patient's impairments (multi-system involvement) PT goals addressed during session: Mobility/safety with mobility        AM-PAC PT "6 Clicks" Mobility   Outcome Measure  Help needed turning from your back to your side while in a flat bed without using bedrails?: Total Help needed moving from lying on your back to sitting on the side of a flat bed without using  bedrails?: Total Help needed moving to and from a bed to a chair (including a wheelchair)?: Total Help needed standing up from a chair using your arms (e.g., wheelchair or bedside chair)?: Total Help needed to walk in hospital room?: Total Help needed climbing 3-5 steps with a railing? : Total 6 Click Score: 6    End of Session   Activity Tolerance: Patient tolerated treatment well Patient left: in chair;with call bell/phone within reach;with chair alarm set Nurse Communication: Mobility status PT Visit Diagnosis: Muscle weakness (generalized) (M62.81);Difficulty in walking, not elsewhere classified (R26.2);Other abnormalities of gait and mobility (R26.89)     Time: 1121-1200 PT Time Calculation (min) (ACUTE ONLY): 39 min  Charges:  $Therapeutic Activity: 8-22 mins                     08/13/2019  Jacinto Halim., PT Acute Rehabilitation Services [phone]  (pager) [phone]  (office)   [name] [name] 08/13/2019, 5:41 PM

## 2019-08-13 NOTE — Progress Notes (Addendum)
NAME:  George Kelly, MRN:  [id number], DOB:  06/15/1990, LOS: 15 ADMISSION DATE:  07/25/2019, CONSULTATION DATE:  07/25/2019 REFERRING MD:  Dr. Langston Masker, CHIEF COMPLAINT:  Cardiac arrest    Brief History   29 yo male found unresponsive with probable drug overdose.  PEA arrest with ROSC after about 15 minutes.  Found to have lactic acidosis, AKI from rhabdomyolysis, aspiration pneumonia, shock liver, hypothermia.  UDS positive for cocaine, benzo's, THC.       Past Medical History  Substance abuse   Significant Hospital Events   6/09 Admit, start TTM and LTM 6/10 CRRT 6/11 off CRRT, rewarming from TTM 6/12 A fib with RVR >> amiodarone 6/25 attempting to wean off IV meds-Precedex-still no happy medium between sedation and agitation  Consults:  Nephrology - signed off 6/12  Procedures:  ETT 6/09 >> 6/21 Rt chest tube 6/09 >> out Lt radial a line 6/09 >> out  Rt [place] CVL 6/09 >> 6/23  Rt femoral HD cath 6/09 >> 6/12 LUE PICC 6/23 >> Trach 6/21 >>   Significant Diagnostic Tests:   CT head 6/09 >> no acute findings  CT chest 6/09 >> 10% Rt PTX, diffuse tree in bud and nodular opacities, debris within b/l mainstem bronchi, mucous plugging lower lobes  CT abd/pelvis 6/09 >> normal  Renal u/s 6/10 >> echogenic kidney b/l  Echo 6/10 >> EF 55 to 60%, grade 2 DD  CT Head 6/14 >> No acute intracranial abnormality.  CT Chest, abd, pelvis 6/14 >> Extensive bilateral airspace opacities, worsening since prior study.Dense consolidation in both lower lobes with small bilateral effusions. Right chest tube in place. Small residual right anterior pneumothorax. No acute findings in the abdomen or pelvis.   Micro Data:  COVID 6/9 > negative Blood culture 6/9 > lactobacillus Urine culture 6/9 > negative BCx2 6/14 >> negative Resp 6/15 >> enterobacter BC 6/21 >> negative Tracheal aspirate 6/21 >> negative  Antimicrobials:  Zosyn 6/09 >> 6/14 Vancomycin 6/09 >> 6/10 Zyvox 6/11 linezolid  6/15 >> 6/16 merrem 6/15 >> 6/16 Cefepime 6/17 >> 6/23  Interim history/subjective:   Tolerated PSV 6/27 and again this am  I/O + 1.6L total  Fever overnight   Objective   Blood pressure (!) 146/100, pulse 80, temperature 99.6 F (37.6 C), temperature source Axillary, resp. rate 18, height 6\' 1"  (1.854 m), weight 87.1 kg, SpO2 98 %.    Vent Mode: PCV FiO2 (%):  [30 %] 30 % Set Rate:  [26 bmp] 26 bmp PEEP:  [5 cmH20] 5 cmH20 Pressure Support:  [15 cmH20] 15 cmH20 Plateau Pressure:  [18 cmH20-23 cmH20] 18 cmH20   Intake/Output Summary (Last 24 hours) at 08/13/2019 0957 Last data filed at 08/13/2019 0900 Gross per 24 hour  Intake 3173.68 ml  Output 3800 ml  Net -626.32 ml   Filed Weights   08/09/19 0220 08/10/19 0142 08/13/19 0405  Weight: 85.4 kg 85.2 kg 87.1 kg    Examination: GEN: Ill-appearing young man, ventilated via tracheostomy HEENT: Stoma clean, minimal secretions CV: Regular, distant, no murmur PULM: Bilateral scattered rhonchi, no wheezing GI: Nondistended, positive bowel sounds EXT: No edema NEURO: Wakes to voice, moves all extremities to command SKIN: No rash   Resolved/Stable Hospital Problem list   Hyperkalemia, Anion gap metabolic acidosis with lactic acidosis, Hypoglycemia Shock Reactive Afib/RVR Thrombocytopenia Anemia Transaminitis Gastritis AKI Hypernatremia  Assessment & Plan:   Acute Hypoxic Respiratory Failure in setting of Cardiac Arrest and Aspiration Pneumonia S/p trach 6/17 due to delirium/encephalopathy  and secretion clearance issues. Continue pressure support as he can tolerate.  Goal transition to ATC Add guaifenesin 6/28  Enterobacter Pneumonia, treated Completed cefepime for 7 days, stopped 6/23 Continue to follow off antibiotics  Intermittent Fever, recurrent 6/27 Question central fever. 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  Plan to begin to wean - may now be contributing to his encephalopathy Remains on Precedex infusion.  Goal wean as able Push PT.  Hopefully he can improve to a place where he would be a CIR candidate  Polysubstance Abuse UDS positive for THC, cocaine and benzo Will work to decrease clonazepam some - may be contributing to delirium Wean precedex as able.   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 Critical Care Time devoted to patient care services described in this note independent of APP/resident time (if applicable)  is 33 minutes.   Levy Pupa, MD, PhD 08/13/2019, 10:08 AM [facility] [phone] or if no answer [phone]

## 2019-08-14 ENCOUNTER — Inpatient Hospital Stay (HOSPITAL_COMMUNITY): Payer: Self-pay

## 2019-08-14 LAB — BASIC METABOLIC PANEL
Anion gap: 9 (ref 5–15)
BUN: 17 mg/dL (ref 6–20)
CO2: 28 mmol/L (ref 22–32)
Calcium: 8.8 mg/dL — ABNORMAL LOW (ref 8.9–10.3)
Chloride: 103 mmol/L (ref 98–111)
Creatinine, Ser: 1.08 mg/dL (ref 0.61–1.24)
GFR calc Af Amer: >60 mL/min (ref >60)
GFR calc non Af Amer: >60 mL/min (ref >60)
Glucose, Bld: 109 mg/dL — ABNORMAL HIGH (ref 70–99)
Potassium: 4.1 mmol/L (ref 3.5–5.1)
Sodium: 140 mmol/L (ref 135–145)

## 2019-08-14 LAB — CBC
HCT: 28.6 % — ABNORMAL LOW (ref 39.0–52.0)
Hemoglobin: 9.2 g/dL — ABNORMAL LOW (ref 13.0–17.0)
MCH: 28 pg (ref 26.0–34.0)
MCHC: 32.2 g/dL (ref 30.0–36.0)
MCV: 86.9 fL (ref 80.0–100.0)
Platelets: 419 10*3/uL — ABNORMAL HIGH (ref 150–400)
RBC: 3.29 MIL/uL — ABNORMAL LOW (ref 4.22–5.81)
RDW: 14.8 % (ref 11.5–15.5)
WBC: 17.1 10*3/uL — ABNORMAL HIGH (ref 4.0–10.5)
nRBC: 0 % (ref 0.0–0.2)

## 2019-08-14 LAB — GLUCOSE, CAPILLARY
Glucose-Capillary: 100 mg/dL — ABNORMAL HIGH (ref 70–99)
Glucose-Capillary: 103 mg/dL — ABNORMAL HIGH (ref 70–99)
Glucose-Capillary: 105 mg/dL — ABNORMAL HIGH (ref 70–99)
Glucose-Capillary: 105 mg/dL — ABNORMAL HIGH (ref 70–99)
Glucose-Capillary: 108 mg/dL — ABNORMAL HIGH (ref 70–99)
Glucose-Capillary: 115 mg/dL — ABNORMAL HIGH (ref 70–99)

## 2019-08-14 LAB — MAGNESIUM: Magnesium: 1.8 mg/dL (ref 1.7–2.4)

## 2019-08-14 MED ORDER — ACETAMINOPHEN 325 MG PO TABS
650.0000 mg | ORAL_TABLET | Freq: Four times a day (QID) | ORAL | Status: DC | PRN
  Administered 2019-08-17 – 2019-08-26 (×2): 650 mg
  Filled 2019-08-14 (×2): qty 2

## 2019-08-14 MED ORDER — FREE WATER
200.0000 mL | Freq: Four times a day (QID) | Status: DC
  Administered 2019-08-14 – 2019-08-31 (×67): 200 mL

## 2019-08-14 MED ORDER — SERTRALINE HCL 50 MG PO TABS
50.0000 mg | ORAL_TABLET | Freq: Every day | ORAL | Status: DC
  Administered 2019-08-14 – 2019-08-21 (×8): 50 mg
  Filled 2019-08-14 (×8): qty 1

## 2019-08-14 NOTE — Progress Notes (Signed)
NAME:  George Kelly, MRN:  [id number], DOB:  06/15/1990, LOS: 20 ADMISSION DATE:  07/25/2019, CONSULTATION DATE:  07/25/2019 REFERRING MD:  Dr. Renaye Rakers, CHIEF COMPLAINT:  Cardiac arrest    Brief History   29 yo male found unresponsive with probable drug overdose.  PEA arrest with ROSC after about 15 minutes.  Found to have lactic acidosis, AKI from rhabdomyolysis, aspiration pneumonia, shock liver, hypothermia.  UDS positive for cocaine, benzo's, THC.       Past Medical History  Substance abuse   Significant Hospital Events   6/09 Admit, start TTM and LTM 6/10 CRRT 6/11 off CRRT, rewarming from TTM 6/12 A fib with RVR >> amiodarone 6/25 attempting to wean off IV meds-Precedex-still no happy medium between sedation and agitation  Consults:  Nephrology - signed off 6/12  Procedures:  ETT 6/09 >> 6/21 Rt chest tube 6/09 >> out Lt radial a line 6/09 >> out  Rt [place] CVL 6/09 >> 6/23  Rt femoral HD cath 6/09 >> 6/12 LUE PICC 6/23 >> Trach 6/21 >>   Significant Diagnostic Tests:   CT head 6/09 >> no acute findings  CT chest 6/09 >> 10% Rt PTX, diffuse tree in bud and nodular opacities, debris within b/l mainstem bronchi, mucous plugging lower lobes  CT abd/pelvis 6/09 >> normal  Renal u/s 6/10 >> echogenic kidney b/l  Echo 6/10 >> EF 55 to 60%, grade 2 DD  CT Head 6/14 >> No acute intracranial abnormality.  CT Chest, abd, pelvis 6/14 >> Extensive bilateral airspace opacities, worsening since prior study.Dense consolidation in both lower lobes with small bilateral effusions. Right chest tube in place. Small residual right anterior pneumothorax. No acute findings in the abdomen or pelvis.   Micro Data:  COVID 6/9 > negative Blood culture 6/9 > lactobacillus Urine culture 6/9 > negative BCx2 6/14 >> negative Resp 6/15 >> enterobacter BC 6/21 >> negative Tracheal aspirate 6/21 >> negative  Antimicrobials:  Zosyn 6/09 >> 6/14 Vancomycin 6/09 >> 6/10 Zyvox 6/11 linezolid  6/15 >> 6/16 merrem 6/15 >> 6/16 Cefepime 6/17 >> 6/23  Interim history/subjective:  Intermittent fevers since 6/13.  WBC trending down.  Remains on precedex.  Objective   Blood pressure (!) 146/97, pulse 95, temperature 99 F (37.2 C), temperature source Axillary, resp. rate (!) 26, height 6\' 1"  (1.854 m), weight 81.6 kg, SpO2 98 %.    Vent Mode: CPAP;PSV FiO2 (%):  [30 %] 30 % Set Rate:  [26 bmp] 26 bmp PEEP:  [5 cmH20] 5 cmH20 Pressure Support:  [10 cmH20-15 cmH20] 10 cmH20 Plateau Pressure:  [22 cmH20-27 cmH20] 25 cmH20   Intake/Output Summary (Last 24 hours) at 08/14/2019 0840 Last data filed at 08/14/2019 0730 Gross per 24 hour  Intake 2341.92 ml  Output 3300 ml  Net -958.08 ml   Filed Weights   08/10/19 0142 08/13/19 0405 08/14/19 0400  Weight: 85.2 kg 87.1 kg 81.6 kg    Examination:  General - intermittently agitated Eyes - pupils reactive ENT - trach site clean Cardiac - regular rate/rhythm, no murmur Chest - equal breath sounds b/l, no wheezing or rales Abdomen - soft, non tender, + bowel sounds Extremities - no cyanosis, clubbing, or edema Skin - no rashes Neuro - moves extremities  Resolved/Stable Hospital Problem list   Hyperkalemia, Anion gap metabolic acidosis with lactic acidosis, Hypoglycemia, Septic shock, Reactive Afib/RVR, Thrombocytopenia from sepsis, AKI from ATN 2nd to sepsis, Elevated LFTs from hypotension, Gastritis, Hypernatremia, Aspiration pneumonia with Enterobacter (completed ABx 6/23), Rt  pneumothorax after CPR  Assessment & Plan:   Acute Hypoxic Respiratory Failure in setting of Cardiac Arrest and Aspiration Pneumonia. - s/p trach 6/17 - pressure support wean to TC as able - trach care  - f/u CXR intermittently  Intermittent Fever. - no other signs of infection - prn tylenol, ibuprofen  Acute metabolic encephalopathy 2nd to anoxia, sepsis. Hx of substance abuse. - UDS positive for THC, cocaine, benzo's - wean precedex for RASS  goal 0 to -1 - continue klonopin, catapres, duragesic, oxycodone, seroquel - add zoloft  Hypertension. - catapres, labetalol - goal SBP < 150  Anemia of critical illness. - f/u CBC - transfuse for Hb < 7 or significant bleeding  Deconditioning. - will need LTAC if not able to wean off vent - otherwise might be candidate for CIR   Best practice:  Diet:  TF DVT prophylaxis: lovenox GI prophylaxis: protonix Mobility: As tolerated Code Status: Full Disposition: ICU   Labs:   CMP Latest Ref Rng & Units 08/14/2019 08/12/2019 08/11/2019  Glucose 70 - 99 mg/dL 720(N) 470(J) -  BUN 6 - 20 mg/dL 17 20 -  Creatinine 6.28 - 1.24 mg/dL 3.66 2.94 -  Sodium 765 - 145 mmol/L 140 140 143  Potassium 3.5 - 5.1 mmol/L 4.1 3.5 3.7  Chloride 98 - 111 mmol/L 103 105 -  CO2 22 - 32 mmol/L 28 26 -  Calcium 8.9 - 10.3 mg/dL 4.6(T) 7.9(L) -  Total Protein 6.5 - 8.1 g/dL - 6.5 -  Total Bilirubin 0.3 - 1.2 mg/dL - 0.8 -  Alkaline Phos 38 - 126 U/L - 197(H) -  AST 15 - 41 U/L - 56(H) -  ALT 0 - 44 U/L - 71(H) -    CBC Latest Ref Rng & Units 08/14/2019 08/12/2019 08/11/2019  WBC 4.0 - 10.5 K/uL 17.1(H) 13.3(H) -  Hemoglobin 13.0 - 17.0 g/dL 0.3(T) 4.6(F) 6.8(L)  Hematocrit 39 - 52 % 28.6(L) 26.6(L) 29.0(L)  Platelets 150 - 400 K/uL 419(H) 469(H) -    ABG    Component Value Date/Time   PHART 7.438 08/11/2019 1130   PCO2ART 41.8 08/11/2019 1130   PO2ART 84 08/11/2019 1130   HCO3 28.1 (H) 08/11/2019 1130   TCO2 29 08/11/2019 1130   ACIDBASEDEF 6.0 (H) 07/26/2019 1121   O2SAT 96.0 08/11/2019 1130    CBG (last 3)  Recent Labs    08/13/19 2344 08/14/19 0348 08/14/19 0732  GLUCAP 98 115* 108*    Signature:  Coralyn Helling, MD [facility] Pulmonary/Critical Care Pager - [phone] 08/14/2019, 8:51 AM

## 2019-08-14 NOTE — Progress Notes (Signed)
Had increased RR, hypoxia with SBT this morning.  Changed to Pressure control 20 cm H2O, PEEP 5, RR 22.  WOB and oxygenation improved.  Coralyn Helling, MD [facility] Pulmonary/Critical Care Pager - [phone] 08/14/2019, 9:01 AM

## 2019-08-14 NOTE — Progress Notes (Addendum)
Increased pt's FiO2 from 30% to 40% due to pt's sat's 89%-93% after changing pt's HME. Pt tolerating change well.

## 2019-08-14 NOTE — Progress Notes (Signed)
RT came in room to change pt's HME. Pt was on wean from this morning, and RT found that pt had been flipped back to full support. Pt currently on full support.

## 2019-08-15 LAB — GLUCOSE, CAPILLARY
Glucose-Capillary: 114 mg/dL — ABNORMAL HIGH (ref 70–99)
Glucose-Capillary: 107 mg/dL — ABNORMAL HIGH (ref 70–99)
Glucose-Capillary: 108 mg/dL — ABNORMAL HIGH (ref 70–99)
Glucose-Capillary: 94 mg/dL (ref 70–99)
Glucose-Capillary: 98 mg/dL (ref 70–99)
Glucose-Capillary: 102 mg/dL — ABNORMAL HIGH (ref 70–99)

## 2019-08-15 MED ORDER — PRO-STAT SUGAR FREE PO LIQD
30.0000 mL | Freq: Two times a day (BID) | ORAL | Status: DC
  Administered 2019-08-15 – 2019-08-22 (×14): 30 mL
  Filled 2019-08-15 (×14): qty 30

## 2019-08-15 MED ORDER — VITAL 1.5 CAL PO LIQD
1000.0000 mL | ORAL | Status: DC
  Administered 2019-08-15 – 2019-08-22 (×8): 1000 mL
  Filled 2019-08-15 (×11): qty 1000

## 2019-08-15 NOTE — Progress Notes (Signed)
Physical Therapy Treatment Patient Details Name: George Kelly MRN: [id number] DOB: 06/15/1990 Today's Date: 08/15/2019    History of Present Illness 29 yo male found unresponsive with probable drug overdose.  PEA arrest with ROSC after about 15 minutes.  Found to have lactic acidosis, AKI from rhabdomyolysis, aspiration pneumonia, shock liver, hypothermia.  UDS positive for cocaine, benzo's, THC.   6/17 tracheostomy.    PT Comments    Pt still lethargic initially continuing at some level throughout the session, but he had his eyes open much of the time with cues to maintain an arousal level.  Emphasis on transition to EOB, sitting balance/truncal reactions/balance, sit to stand initiated by pt and transfer to the recliner.    Follow Up Recommendations  CIR;Supervision/Assistance - 24 hour     Equipment Recommendations  Other (comment) (TBA)    Recommendations for Other Services       Precautions / Restrictions Precautions Precautions: Fall Precaution Comments: 4 pt restraints, flexi, condom cath and NG Restrictions Weight Bearing Restrictions: No    Mobility  Bed Mobility Overal bed mobility: Needs Assistance Bed Mobility: Supine to Sit     Supine to sit: +2 for physical assistance;+2 for safety/equipment;Total assist     General bed mobility comments: totalA+2 for all aspects  Transfers Overall transfer level: Needs assistance Equipment used:  (2 person face to face transfer) Transfers: Sit to/from [employer] Sit to Stand: Max assist;+2 physical assistance;+2 safety/equipment Stand pivot transfers: Max assist;+2 physical assistance;+2 safety/equipment       General transfer comment: maxA+2 face to face transfer from EOB;pt impulsively initiated sit<>stand transfer;therapists assisted with stand-pivot to recliner  Ambulation/Gait                 Stairs             Wheelchair Mobility    Modified Rankin (Stroke Patients  Only)       Balance Overall balance assessment: Needs assistance Sitting-balance support: Feet supported;Bilateral upper extremity supported Sitting balance-Leahy Scale: Poor Sitting balance - Comments: pt continued to require modA-maxA for postural control;pt with attempt to correct trunk sitting EOB requiring max for coordinated movements Postural control: Posterior lean Standing balance support: Bilateral upper extremity supported Standing balance-Leahy Scale: Zero Standing balance comment: pt impulsively initiated standing 2x this session requiring maxA+2 for powerup, support and stability;required assistance from therapist to block knees                            Cognition Arousal/Alertness: Lethargic Behavior During Therapy: Flat affect;Impulsive Overall Cognitive Status: Difficult to assess                                 General Comments: pt keeping his eyes open more frequently and for longer duration this session;pt making eye contact with therapist and appeared to be attempting to mouth words;no agitation this session, pt impulsively attempting to stand while sitting EOB      Exercises      General Comments General comments (skin integrity, edema, etc.): vss on 30L/min 50% FiO2;frequent coughing, copious secretions in trach, pt drooling from mouth throughout session      Pertinent Vitals/Pain Pain Assessment: Faces Faces Pain Scale: No hurt Pain Intervention(s): Monitored during session    Home Living  Prior Function            PT Goals (current goals can now be found in the care plan section) Acute Rehab PT Goals Patient Stated Goal: none stated PT Goal Formulation: With patient Time For Goal Achievement: 08/19/19 Potential to Achieve Goals: Fair Progress towards PT goals: Progressing toward goals    Frequency    Min 3X/week      PT Plan Current plan remains appropriate;Frequency needs to be  updated    Co-evaluation PT/OT/SLP Co-Evaluation/Treatment: Yes Reason for Co-Treatment: Complexity of the patient's impairments (multi-system involvement) PT goals addressed during session: Mobility/safety with mobility OT goals addressed during session: ADL's and self-care      AM-PAC PT "6 Clicks" Mobility   Outcome Measure  Help needed turning from your back to your side while in a flat bed without using bedrails?: Total Help needed moving from lying on your back to sitting on the side of a flat bed without using bedrails?: Total Help needed moving to and from a bed to a chair (including a wheelchair)?: Total Help needed standing up from a chair using your arms (e.g., wheelchair or bedside chair)?: Total Help needed to walk in hospital room?: Total Help needed climbing 3-5 steps with a railing? : Total 6 Click Score: 6    End of Session Equipment Utilized During Treatment: Oxygen Activity Tolerance: Patient tolerated treatment well Patient left: in chair;with call bell/phone within reach;with chair alarm set;with restraints reapplied Nurse Communication: Mobility status PT Visit Diagnosis: Muscle weakness (generalized) (M62.81);Difficulty in walking, not elsewhere classified (R26.2);Other abnormalities of gait and mobility (R26.89)     Time: 1115-1150 PT Time Calculation (min) (ACUTE ONLY): 35 min  Charges:  $Therapeutic Activity: 8-22 mins                     08/15/2019  Jacinto Halim., PT Acute Rehabilitation Services [phone]  (pager) [phone]  (office)   [name] Daryus Sowash 08/15/2019, 2:50 PM

## 2019-08-15 NOTE — Progress Notes (Signed)
Occupational Therapy Treatment Patient Details Name: George Kelly MRN: [id number] DOB: 06/15/1990 Today's Date: 08/15/2019    History of present illness 29 yo male found unresponsive with probable drug overdose.  PEA arrest with ROSC after about 15 minutes.  Found to have lactic acidosis, AKI from rhabdomyolysis, aspiration pneumonia, shock liver, hypothermia.  UDS positive for cocaine, benzo's, THC.   6/17 tracheostomy.   OT comments  Pt currently requiring totalA+2 for bed mobility. He tolerated sitting EOB for 5-20min. Pt keeping eyes open for longer duration this session and making eye contact with therapist. Pt continues to require totalA for all ADL, requiring hand over hand assistance to wipe mouth and face. Pt impulsively attempting to stand 2x this session requiring maxA+2 for sit<>stand and stand-pivot to recliner. Pt will continue to benefit from skilled OT services to maximize safety and independence with ADL/IADL and functional mobility. Will continue to follow acutely and progress as tolerated.    Follow Up Recommendations  CIR (pending progress may need LTACH)    Equipment Recommendations  Other (comment) (TBD)    Recommendations for Other Services      Precautions / Restrictions Precautions Precautions: Fall Precaution Comments: 4 pt restraints, flexi, condom cath and NG Restrictions Weight Bearing Restrictions: No       Mobility Bed Mobility Overal bed mobility: Needs Assistance Bed Mobility: Supine to Sit     Supine to sit: +2 for physical assistance;+2 for safety/equipment;Total assist     General bed mobility comments: totalA+2 for all aspects  Transfers Overall transfer level: Needs assistance Equipment used:  (2 person face to face transfer) Transfers: Sit to/from [employer] Sit to Stand: Max assist;+2 physical assistance;+2 safety/equipment Stand pivot transfers: Max assist;+2 physical assistance;+2 safety/equipment        General transfer comment: maxA+2 face to face transfer from EOB;pt impulsively initiated sit<>stand transfer;therapists assisted with stand-pivot to recliner    Balance Overall balance assessment: Needs assistance Sitting-balance support: Feet supported;Bilateral upper extremity supported Sitting balance-Leahy Scale: Poor Sitting balance - Comments: pt continued to require modA-maxA for postural control;pt with attempt to correct trunk sitting EOB requiring max for coordinated movements Postural control: Posterior lean Standing balance support: Bilateral upper extremity supported Standing balance-Leahy Scale: Zero Standing balance comment: pt impulsively initiated standing 2x this session requiring maxA+2 for powerup, support and stability;required assistance from therapist to block knees                           ADL either performed or assessed with clinical judgement   ADL Overall ADL's : Needs assistance/impaired                                       General ADL Comments: continues to require totalA for all aspects of ADL;pt not following simple one step commands;more awake this session but continues to require hand over hand assistance     Vision       Perception     Praxis      Cognition Arousal/Alertness: Lethargic Behavior During Therapy: Flat affect;Impulsive Overall Cognitive Status: Difficult to assess                                 General Comments: pt keeping his eyes open more frequently and for longer duration this session;pt making eye  contact with therapist and appeared to be attempting to mouth words;no agitation this session, pt impulsively attempting to stand while sitting EOB        Exercises     Shoulder Instructions       General Comments vss on 30L/min 50% FiO2;frequent coughing, copious secretions in trach, pt drooling from mouth throughout session    Pertinent Vitals/ Pain       Pain Assessment:  Faces Faces Pain Scale: No hurt Pain Intervention(s): Monitored during session  Home Living                                          Prior Functioning/Environment              Frequency  Min 2X/week        Progress Toward Goals  OT Goals(current goals can now be found in the care plan section)  Progress towards OT goals: Progressing toward goals  Acute Rehab OT Goals Patient Stated Goal: none stated OT Goal Formulation: Patient unable to participate in goal setting Potential to Achieve Goals: Fair ADL Goals Pt Will Perform Grooming: with min assist;sitting Pt/caregiver will Perform Home Exercise Program: Increased strength;Increased ROM;Both right and left upper extremity;With minimal assist;With written HEP provided Additional ADL Goal #1: Pt will tolerate sitting EOB >7 min with minA as precursor to EOB/OOB ADL. Additional ADL Goal #2: Pt will follow 1 step simple command with 75% accuracy during ADL/functional task.  Plan Discharge plan remains appropriate    Co-evaluation    PT/OT/SLP Co-Evaluation/Treatment: Yes Reason for Co-Treatment: Complexity of the patient's impairments (multi-system involvement);For patient/therapist safety;To address functional/ADL transfers   OT goals addressed during session: ADL's and self-care      AM-PAC OT "6 Clicks" Daily Activity     Outcome Measure   Help from another person eating meals?: Total Help from another person taking care of personal grooming?: Total Help from another person toileting, which includes using toliet, bedpan, or urinal?: Total Help from another person bathing (including washing, rinsing, drying)?: Total Help from another person to put on and taking off regular upper body clothing?: Total Help from another person to put on and taking off regular lower body clothing?: Total 6 Click Score: 6    End of Session Equipment Utilized During Treatment: Oxygen  OT Visit Diagnosis: Other  abnormalities of gait and mobility (R26.89);Muscle weakness (generalized) (M62.81);Other symptoms and signs involving cognitive function   Activity Tolerance Patient tolerated treatment well   Patient Left in chair;with call bell/phone within reach;with chair alarm set;with restraints reapplied;Other (comment) (chair alarm belt)   Nurse Communication Mobility status        Time: 1115-1150 OT Time Calculation (min): 35 min  Charges: OT General Charges $OT Visit: 1 Visit OT Treatments $Self Care/Home Management : 8-22 mins  Rosey Bath OTR/L Acute Rehabilitation Services Office: [phone]    Rebeca Alert 08/15/2019, 2:24 PM

## 2019-08-15 NOTE — Progress Notes (Signed)
SLP Cancellation Note  Patient Details Name: George Kelly MRN: [id number] DOB: 06/15/1990   Cancelled treatment:       Reason Eval/Treat Not Completed: Patient not medically ready. Pt still on precedex and versed, on vent, weaning. Hopeful for ATC soon as pt is more interactive per RN, but become easily agitated. Following for PMSV readiness.    Lin Hackmann, Riley Nearing 08/15/2019, 11:43 AM

## 2019-08-15 NOTE — Progress Notes (Signed)
NAME:  George Kelly, MRN:  [id number], DOB:  06/15/1990, LOS: 21 ADMISSION DATE:  07/25/2019, CONSULTATION DATE:  07/25/2019 REFERRING MD:  Dr. Renaye Rakers, CHIEF COMPLAINT:  Cardiac arrest    Brief History   29 yo male found unresponsive with probable drug overdose.  PEA arrest with ROSC after about 15 minutes.  Found to have lactic acidosis, AKI from rhabdomyolysis, aspiration pneumonia, shock liver, hypothermia.  UDS positive for cocaine, benzo's, THC.       Past Medical History  Substance abuse   Significant Hospital Events   6/09 Admit, start TTM and LTM 6/10 CRRT 6/11 off CRRT, rewarming from TTM 6/12 A fib with RVR >> amiodarone 6/25 attempting to wean off IV meds-Precedex-still no happy medium between sedation and agitation  Consults:  Nephrology - signed off 6/12  Procedures:  ETT 6/09 >> 6/21 Rt chest tube 6/09 >> out Lt radial a line 6/09 >> out  Rt [place] CVL 6/09 >> 6/23  Rt femoral HD cath 6/09 >> 6/12 LUE PICC 6/23 >> Trach 6/21 >>   Significant Diagnostic Tests:   CT head 6/09 >> no acute findings  CT chest 6/09 >> 10% Rt PTX, diffuse tree in bud and nodular opacities, debris within b/l mainstem bronchi, mucous plugging lower lobes  CT abd/pelvis 6/09 >> normal  Renal u/s 6/10 >> echogenic kidney b/l  Echo 6/10 >> EF 55 to 60%, grade 2 DD  CT Head 6/14 >> No acute intracranial abnormality.  CT Chest, abd, pelvis 6/14 >> Extensive bilateral airspace opacities, worsening since prior study.Dense consolidation in both lower lobes with small bilateral effusions. Right chest tube in place. Small residual right anterior pneumothorax. No acute findings in the abdomen or pelvis.   Micro Data:  COVID 6/9 > negative Blood culture 6/9 > lactobacillus Urine culture 6/9 > negative BCx2 6/14 >> negative Resp 6/15 >> enterobacter BC 6/21 >> negative Tracheal aspirate 6/21 >> negative  Antimicrobials:  Zosyn 6/09 >> 6/14 Vancomycin 6/09 >> 6/10 Zyvox 6/11 linezolid  6/15 >> 6/16 merrem 6/15 >> 6/16 Cefepime 6/17 >> 6/23  Interim history/subjective:  No fevers since 6/28.  Objective   Blood pressure (!) 130/91, pulse 78, temperature 97.8 F (36.6 C), temperature source Axillary, resp. rate (!) 26, height 6\' 1"  (1.854 m), weight 80.5 kg, SpO2 100 %.    Vent Mode: PSV;CPAP FiO2 (%):  [30 %-50 %] 50 % Set Rate:  [22 bmp] 22 bmp PEEP:  [5 cmH20] 5 cmH20 Pressure Support:  [15 cmH20] 15 cmH20 Plateau Pressure:  [19 cmH20-22 cmH20] 22 cmH20   Intake/Output Summary (Last 24 hours) at 08/15/2019 0810 Last data filed at 08/15/2019 0700 Gross per 24 hour  Intake 2047.55 ml  Output 2850 ml  Net -802.45 ml   Filed Weights   08/13/19 0405 08/14/19 0400 08/15/19 0500  Weight: 87.1 kg 81.6 kg 80.5 kg    Examination:  General - intermittently agitated Eyes - pupils reactive ENT - trach site clean Cardiac - regular rate/rhythm, no murmur Chest - equal breath sounds b/l, no wheezing or rales Abdomen - soft, non tender, + bowel sounds Extremities - no cyanosis, clubbing, or edema Skin - no rashes Neuro - moves extremities, intermittently follows commands  Resolved/Stable Hospital Problem list   Hyperkalemia, Anion gap metabolic acidosis with lactic acidosis, Hypoglycemia, Septic shock, Reactive Afib/RVR, Thrombocytopenia from sepsis, AKI from ATN 2nd to sepsis, Elevated LFTs from hypotension, Gastritis, Hypernatremia, Aspiration pneumonia with Enterobacter (completed ABx 6/23), Rt pneumothorax after CPR  Assessment &  Plan:   Acute Hypoxic Respiratory Failure in setting of Cardiac Arrest and Aspiration Pneumonia. - s/p trach 6/17 - pressure support wean to TC as able - trach care - f/u CXR intermittently - will likely need LTAC placement if he has insurance benefits for this  Intermittent Fever. - no other signs of infection - prn tylenol, ibuprofen  Acute metabolic encephalopathy 2nd to anoxia, sepsis. Organic brain injury. Hx of substance  abuse. - UDS positive for THC, cocaine, benzo's - wean precedex for RASS goal 0 to -1 - continue klonopin, catapres, duragesic, oxycodone, seroquel, zoloft - continue propranolol  Hypertension. - continue catapres, labetalol - goal SBP < 150  Anemia of critical illness. - f/u CBC intermittently - transfuse for Hb < 7 or significant bleeding  Deconditioning. - will need LTAC if not able to wean off vent - otherwise might be candidate for CIR   Best practice:  Diet:  TF DVT prophylaxis: lovenox GI prophylaxis: protonix Mobility: As tolerated Code Status: Full Disposition: ICU   Labs:   CMP Latest Ref Rng & Units 08/14/2019 08/12/2019 08/11/2019  Glucose 70 - 99 mg/dL 500(X) 381(W) -  BUN 6 - 20 mg/dL 17 20 -  Creatinine 2.99 - 1.24 mg/dL 3.71 6.96 -  Sodium 789 - 145 mmol/L 140 140 143  Potassium 3.5 - 5.1 mmol/L 4.1 3.5 3.7  Chloride 98 - 111 mmol/L 103 105 -  CO2 22 - 32 mmol/L 28 26 -  Calcium 8.9 - 10.3 mg/dL 3.8(B) 7.9(L) -  Total Protein 6.5 - 8.1 g/dL - 6.5 -  Total Bilirubin 0.3 - 1.2 mg/dL - 0.8 -  Alkaline Phos 38 - 126 U/L - 197(H) -  AST 15 - 41 U/L - 56(H) -  ALT 0 - 44 U/L - 71(H) -    CBC Latest Ref Rng & Units 08/14/2019 08/12/2019 08/11/2019  WBC 4.0 - 10.5 K/uL 17.1(H) 13.3(H) -  Hemoglobin 13.0 - 17.0 g/dL 0.1(B) 5.1(W) 2.5(E)  Hematocrit 39 - 52 % 28.6(L) 26.6(L) 29.0(L)  Platelets 150 - 400 K/uL 419(H) 469(H) -    ABG    Component Value Date/Time   PHART 7.438 08/11/2019 1130   PCO2ART 41.8 08/11/2019 1130   PO2ART 84 08/11/2019 1130   HCO3 28.1 (H) 08/11/2019 1130   TCO2 29 08/11/2019 1130   ACIDBASEDEF 6.0 (H) 07/26/2019 1121   O2SAT 96.0 08/11/2019 1130    CBG (last 3)  Recent Labs    08/14/19 2335 08/15/19 0334 08/15/19 0742  GLUCAP 103* 114* 107*    Signature:  Coralyn Helling, MD [facility] Pulmonary/Critical Care Pager - [phone] 08/15/2019, 8:10 AM

## 2019-08-15 NOTE — Progress Notes (Signed)
Nutrition Follow-up  DOCUMENTATION CODES:   Not applicable  INTERVENTION:   Tube Feeding via Cortrak:  Vital 1.5 at 60 ml/hr Pro-Stat 30 mL BID Provides 127 g of protein, 2360 kcals, 1094 mL of free water Meets 100% estimated nutritional needs  Recommend considering PEG placement  NUTRITION DIAGNOSIS:   Inadequate oral intake related to acute illness as evidenced by NPO status.  Being addressed via TF   GOAL:   Patient will meet greater than or equal to 90% of their needs  Progressing  MONITOR:   Vent status, Labs, Weight trends, TF tolerance  REASON FOR ASSESSMENT:   Consult, Ventilator Enteral/tube feeding initiation and management  ASSESSMENT:   29 yo male found unresponsive with possible drug OD, admitted post cardiac arrest to ICU on vent with severe shock, AKI with hyperkalemia and acidosis. PMH includes polysubstance abuse.  6/09 CRRT initiated 6/10 CRRT discontinued 6/14 CT abdomen negative for acute process 6/17 Trach placement  6/18 Cortrak placed  Pressure support wean as tolerated to TC. Sitting up in chair on visit today. Remains on precedex  Tolerating Vital 1.5 at 55 ml/hr with Pro-Stat 30 mL TID via Cortrak. Free water 200 mL q 6 hours  Current weight 80.5 kg; admit weight 87 kg. Net negative 6.7 L  Labs:  CBGs 103-114 Meds: reviewed   Diet Order:   Diet Order            Diet NPO time specified  Diet effective now                 EDUCATION NEEDS:   Not appropriate for education at this time  Skin:  Skin Assessment: Skin Integrity Issues: Skin Integrity Issues:: Other (Comment) Other: MASD- groin  Last BM:  6/30  Height:   Ht Readings from Last 1 Encounters:  08/03/19 6\' 1"  (1.854 m)    Weight:   Wt Readings from Last 1 Encounters:  08/15/19 80.5 kg    BMI:  Body mass index is 23.41 kg/m.  Estimated Nutritional Needs:   Kcal:  08/17/19 kcals  Protein:  120-160 g  Fluid:  >/= 2 L   [phone] MS, RDN,  LDN, CNSC Registered Dietitian III RD Pager Number and RD On-Call Pager Number Located in [place]

## 2019-08-16 ENCOUNTER — Inpatient Hospital Stay (HOSPITAL_COMMUNITY): Payer: Self-pay

## 2019-08-16 LAB — GLUCOSE, CAPILLARY
Glucose-Capillary: 110 mg/dL — ABNORMAL HIGH (ref 70–99)
Glucose-Capillary: 93 mg/dL (ref 70–99)
Glucose-Capillary: 95 mg/dL (ref 70–99)
Glucose-Capillary: 93 mg/dL (ref 70–99)
Glucose-Capillary: 107 mg/dL — ABNORMAL HIGH (ref 70–99)
Glucose-Capillary: 95 mg/dL (ref 70–99)

## 2019-08-16 LAB — CBC
HCT: 28.4 % — ABNORMAL LOW (ref 39.0–52.0)
Hemoglobin: 9.2 g/dL — ABNORMAL LOW (ref 13.0–17.0)
MCH: 28 pg (ref 26.0–34.0)
MCHC: 32.4 g/dL (ref 30.0–36.0)
MCV: 86.6 fL (ref 80.0–100.0)
Platelets: 366 10*3/uL (ref 150–400)
RBC: 3.28 MIL/uL — ABNORMAL LOW (ref 4.22–5.81)
RDW: 14.6 % (ref 11.5–15.5)
WBC: 12.9 10*3/uL — ABNORMAL HIGH (ref 4.0–10.5)
nRBC: 0 % (ref 0.0–0.2)

## 2019-08-16 LAB — BASIC METABOLIC PANEL
Anion gap: 6 (ref 5–15)
BUN: 16 mg/dL (ref 6–20)
CO2: 30 mmol/L (ref 22–32)
Calcium: 8.6 mg/dL — ABNORMAL LOW (ref 8.9–10.3)
Chloride: 104 mmol/L (ref 98–111)
Creatinine, Ser: 1 mg/dL (ref 0.61–1.24)
GFR calc Af Amer: >60 mL/min (ref >60)
GFR calc non Af Amer: >60 mL/min (ref >60)
Glucose, Bld: 122 mg/dL — ABNORMAL HIGH (ref 70–99)
Potassium: 3.9 mmol/L (ref 3.5–5.1)
Sodium: 140 mmol/L (ref 135–145)

## 2019-08-16 MED ORDER — MIDAZOLAM HCL 2 MG/2ML IJ SOLN
2.0000 mg | Freq: Once | INTRAMUSCULAR | Status: AC
  Administered 2019-08-16: 2 mg via INTRAVENOUS

## 2019-08-16 MED ORDER — MIDAZOLAM HCL 2 MG/2ML IJ SOLN: 2.0000 mg | Freq: Once | INTRAMUSCULAR | Status: DC

## 2019-08-16 NOTE — Progress Notes (Signed)
NAME:  George Kelly, MRN:  [id number], DOB:  06/15/1990, LOS: 22 ADMISSION DATE:  07/25/2019, CONSULTATION DATE:  07/25/2019 REFERRING MD:  Dr. Renaye Rakers, CHIEF COMPLAINT:  Cardiac arrest    Brief History   29 yo male found unresponsive with probable drug overdose.  PEA arrest with ROSC after about 15 minutes.  Found to have lactic acidosis, AKI from rhabdomyolysis, aspiration pneumonia, shock liver, hypothermia.  UDS positive for cocaine, benzo's, THC.       Past Medical History  Substance abuse   Significant Hospital Events   6/09 Admit, start TTM and LTM 6/10 CRRT 6/11 off CRRT, rewarming from TTM 6/12 A fib with RVR >> amiodarone 6/25 attempting to wean off IV meds-Precedex-still no happy medium between sedation and agitation  Consults:  Nephrology - signed off 6/12  Procedures:  ETT 6/09 >> 6/21 Rt chest tube 6/09 >> out Lt radial a line 6/09 >> out  Rt [place] CVL 6/09 >> 6/23  Rt femoral HD cath 6/09 >> 6/12 LUE PICC 6/23 >> Trach 6/21 >>   Significant Diagnostic Tests:   CT head 6/09 >> no acute findings  CT chest 6/09 >> 10% Rt PTX, diffuse tree in bud and nodular opacities, debris within b/l mainstem bronchi, mucous plugging lower lobes  CT abd/pelvis 6/09 >> normal  Renal u/s 6/10 >> echogenic kidney b/l  Echo 6/10 >> EF 55 to 60%, grade 2 DD  CT Head 6/14 >> No acute intracranial abnormality.  CT Chest, abd, pelvis 6/14 >> Extensive bilateral airspace opacities, worsening since prior study.Dense consolidation in both lower lobes with small bilateral effusions. Right chest tube in place. Small residual right anterior pneumothorax. No acute findings in the abdomen or pelvis.   Micro Data:  COVID 6/9 > negative Blood culture 6/9 > lactobacillus Urine culture 6/9 > negative BCx2 6/14 >> negative Resp 6/15 >> enterobacter BC 6/21 >> negative Tracheal aspirate 6/21 >> negative  Antimicrobials:  Zosyn 6/09 >> 6/14 Vancomycin 6/09 >> 6/10 Zyvox 6/11 linezolid  6/15 >> 6/16 merrem 6/15 >> 6/16 Cefepime 6/17 >> 6/23  Interim history/subjective:  No fevers since 6/28. Patient removed CorTrak 6/30. Plan to place NG tube 08/16/19.   Objective   Blood pressure (!) 170/106, pulse 95, temperature 100.2 F (37.9 C), temperature source Oral, resp. rate 20, height 6\' 1"  (1.854 m), weight 78 kg, SpO2 97 %.    Vent Mode: PCV FiO2 (%):  [40 %-50 %] 40 % Set Rate:  [22 bmp] 22 bmp PEEP:  [5 cmH20] 5 cmH20 Pressure Support:  [15 cmH20] 15 cmH20 Plateau Pressure:  [19 cmH20-26 cmH20] 26 cmH20   Intake/Output Summary (Last 24 hours) at 08/16/2019 1226 Last data filed at 08/16/2019 1200 Gross per 24 hour  Intake 1986.63 ml  Output 3925 ml  Net -1938.37 ml   Filed Weights   08/14/19 0400 08/15/19 0500 08/16/19 0500  Weight: 81.6 kg 80.5 kg 78 kg    Examination:  General - AA with trach ventilation, lying in bed with eyes closed, seemingly agitated with exam but easily calmed  Eyes - no scleral icterus, pupils reactive to light. Sluggish  ENT - appears to have secretions collected, trach tube in place  Cardiac - Regular rate and rhythm, do not appreciate murmurs on exam, appropriate cap refill <3 secs  Chest - able to hear ventilator sounds throughout bilateral lung fields Abdomen - soft, occasional bowel sounds,does not appear to have tenderness to palpation  Extremities - moves upper extremities spontaneously, no  LE edema  Skin - warm, dry and intact, do not visualize rashes  Neuro - intermittently alert during exam, closes eyes frequently, responds to sister's voice   Resolved/Stable Hospital Problem list   Hyperkalemia, Anion gap metabolic acidosis with lactic acidosis, Hypoglycemia, Septic shock, Reactive Afib/RVR, Thrombocytopenia from sepsis, AKI from ATN 2nd to sepsis, Elevated LFTs from hypotension, Gastritis, Hypernatremia, Aspiration pneumonia with Enterobacter (completed ABx 6/23), Rt pneumothorax after CPR  Assessment & Plan:   Acute  Hypoxic Respiratory Failure in setting of Cardiac Arrest and Aspiration Pneumonia - s/p trach 6/17 - pressure support wean to TC as able - trach care, consider trach collar depending of patient toleration - CXR intermittently - will likely need LTAC placement if qualifies  Nutrition CorTrak removed overnight.  - NG placed for continued nourishment   Intermittent Fever, resolved  Afebrile overnight, WBC down trending.  - no other signs of infection - prn tylenol, ibuprofen  Acute metabolic encephalopathy 2nd to anoxia, sepsis 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 - UDS positive for THC, cocaine, benzodiazepines  - wean precedex for RASS goal 0 to -1, most recent -3  - continue klonopin, catapres, duragesic, oxycodone, seroquel, zoloft - continue propranolol  Hypertension - continue catapres, labetalol - goal SBP < 150  Anemia of critical illness - f/u CBC intermittently - transfuse for Hb < 7 or significant bleeding  Deconditioning - will need LTAC if not able to wean off vent - otherwise might be candidate for CIR   Best practice:  Diet:  TF DVT prophylaxis: lovenox GI prophylaxis: protonix Mobility: As tolerated Code Status: Full Disposition: ICU   Labs:   CMP Latest Ref Rng & Units 08/16/2019 08/14/2019 08/12/2019  Glucose 70 - 99 mg/dL 664(Q) 034(V) 425(Z)  BUN 6 - 20 mg/dL 16 17 20   Creatinine 0.61 - 1.24 mg/dL 5.63 8.75  Sodium 135 - 145 mmol/L 140 140 140  Potassium 3.5 - 5.1 mmol/L 3.9 4.1 3.5  Chloride 98 - 111 mmol/L 104 103 105  CO2 22 - 32 mmol/L 30 28 26   Calcium 8.9 - 10.3 mg/dL 6.43) ) 7.9(L)  Total Protein 6.5 - 8.1 g/dL - - 6.5  Total Bilirubin 0.3 - 1.2 mg/dL - - 0.8  Alkaline Phos 38 - 126 U/L - - 197(H)  AST 15 - 41 U/L - - 56(H)  ALT 0 - 44 U/L - - 71(H)    CBC Latest Ref Rng & Units 08/16/2019 08/14/2019 08/12/2019  WBC 4.0 - 10.5 K/uL 12.9(H) 17.1(H) 13.3(H)  Hemoglobin 13.0 - 17.0 g/dL 08/16/2019) 08/14/2019) 8.4(Z)  Hematocrit 39  - 52 % 28.4(L) 28.6(L) 26.6(L)  Platelets 150 - 400 K/uL 366 419(H) 469(H)    ABG    Component Value Date/Time   PHART 7.438 08/11/2019 1130   PCO2ART 41.8 08/11/2019 1130   PO2ART 84 08/11/2019 1130   HCO3 28.1 (H) 08/11/2019 1130   TCO2 29 08/11/2019 1130   ACIDBASEDEF 6.0 (H) 07/26/2019 1121   O2SAT 96.0 08/11/2019 1130    CBG (last 3)  Recent Labs    08/16/19 0311 08/16/19 0735 08/16/19 1110  GLUCAP 110* 93 95    Signature:   10/17/19, MD  [facility] Family Medicine Residency PGY-2  08/16/2019, 12:26 PM

## 2019-08-16 NOTE — Progress Notes (Signed)
When in room changing patient, visualized cortrak coiled in mouth. Cut bridle and removed cortrak. Order placed for cortrak team to reinsert.   Barbaraann Cao, RN  [phone] 08/16/2019

## 2019-08-16 NOTE — Plan of Care (Signed)
  Problem: Elimination: Goal: Will not experience complications related to bowel motility Outcome: Progressing Goal: Will not experience complications related to urinary retention Outcome: Progressing   Problem: Nutrition: Goal: Adequate nutrition will be maintained Outcome: Not Progressing Note: Pt dislodged Cortrak overnight. Will address with MD replacing it with NG during morning rounds.

## 2019-08-16 NOTE — Progress Notes (Signed)
Patient currently on 35% trach collar, tolerating well and vitals stable. RT will continue to monitor patient.

## 2019-08-17 ENCOUNTER — Inpatient Hospital Stay (HOSPITAL_COMMUNITY): Payer: Self-pay

## 2019-08-17 LAB — GLUCOSE, CAPILLARY
Glucose-Capillary: 98 mg/dL (ref 70–99)
Glucose-Capillary: 109 mg/dL — ABNORMAL HIGH (ref 70–99)
Glucose-Capillary: 121 mg/dL — ABNORMAL HIGH (ref 70–99)
Glucose-Capillary: 86 mg/dL (ref 70–99)
Glucose-Capillary: 107 mg/dL — ABNORMAL HIGH (ref 70–99)
Glucose-Capillary: 124 mg/dL — ABNORMAL HIGH (ref 70–99)

## 2019-08-17 MED ORDER — OXYCODONE HCL 5 MG PO TABS
40.0000 mg | ORAL_TABLET | Freq: Four times a day (QID) | ORAL | Status: DC
  Administered 2019-08-17 – 2019-08-20 (×13): 40 mg
  Filled 2019-08-17 (×13): qty 8

## 2019-08-17 MED ORDER — HALOPERIDOL LACTATE 5 MG/ML IJ SOLN
2.0000 mg | Freq: Four times a day (QID) | INTRAMUSCULAR | Status: DC | PRN
  Administered 2019-08-17 – 2019-08-20 (×3): 2 mg via INTRAVENOUS
  Filled 2019-08-17 (×4): qty 1

## 2019-08-17 MED ORDER — FENTANYL 100 MCG/HR TD PT72
1.0000 | MEDICATED_PATCH | TRANSDERMAL | Status: DC
  Administered 2019-08-17: 1 via TRANSDERMAL
  Filled 2019-08-17: qty 1

## 2019-08-17 MED ORDER — CLONAZEPAM 1 MG PO TABS
3.0000 mg | ORAL_TABLET | Freq: Two times a day (BID) | ORAL | Status: DC
  Administered 2019-08-17 – 2019-08-18 (×3): 3 mg
  Filled 2019-08-17 (×3): qty 3

## 2019-08-17 NOTE — Progress Notes (Addendum)
Physical Therapy Treatment Patient Details Name: George Kelly MRN: [id number] DOB: 06/15/1990 Today's Date: 08/17/2019    History of Present Illness 29 yo male found unresponsive with probable drug overdose.  PEA arrest with ROSC after about 15 minutes.  Found to have lactic acidosis, AKI from rhabdomyolysis, aspiration pneumonia, shock liver, hypothermia.  UDS positive for cocaine, benzo's, THC.   6/17 tracheostomy.    PT Comments    Attempted to work with pt off sedation (precedex), but pt still at low arousal state.  Emphasis on transitions, sitting balance/truncal activation activities, sit to stand.   Follow Up Recommendations  CIR;Supervision/Assistance - 24 hour     Equipment Recommendations  Other (comment)    Recommendations for Other Services       Precautions / Restrictions Precautions Precautions: Fall Precaution Comments: 4 pt restraints, flexi, condom cath and NG    Mobility  Bed Mobility Overal bed mobility: Needs Assistance Bed Mobility: Supine to Sit     Supine to sit: Max assist;Total assist;+2 for physical assistance Sit to supine: Total assist;+2 for physical assistance   General bed mobility comments: still not aroused enough to coordinate movement  Transfers Overall transfer level: Needs assistance   Transfers: Sit to/from Stand Sit to Stand: Max assist;Total assist;+2 safety/equipment            Ambulation/Gait                 Stairs             Wheelchair Mobility    Modified Rankin (Stroke Patients Only)       Balance Overall balance assessment: Needs assistance   Sitting balance-Leahy Scale: Poor Sitting balance - Comments: worked 10 min EOB working on arousing pt, working on balance by taking pt vary degrees outside BOS to acitate abs/paraspinals/obliques.     Standing balance-Leahy Scale: Zero                              Cognition Arousal/Alertness: Lethargic (even with sedation turned  off.) Behavior During Therapy: Flat affect Overall Cognitive Status: Difficult to assess                                        Exercises      General Comments General comments (skin integrity, edema, etc.): vss overall on TC      Pertinent Vitals/Pain Pain Assessment: Faces Faces Pain Scale: No hurt Pain Intervention(s): Monitored during session    Home Living                      Prior Function            PT Goals (current goals can now be found in the care plan section) Acute Rehab PT Goals Patient Stated Goal: none stated PT Goal Formulation: With patient Time For Goal Achievement: 08/19/19 Potential to Achieve Goals: Fair Progress towards PT goals: Progressing toward goals    Frequency    Min 3X/week      PT Plan Current plan remains appropriate;Frequency needs to be updated    Co-evaluation              AM-PAC PT "6 Clicks" Mobility   Outcome Measure  Help needed turning from your back to your side while in a flat bed without using bedrails?: Total Help needed  moving from lying on your back to sitting on the side of a flat bed without using bedrails?: Total Help needed moving to and from a bed to a chair (including a wheelchair)?: Total Help needed standing up from a chair using your arms (e.g., wheelchair or bedside chair)?: Total Help needed to walk in hospital room?: Total Help needed climbing 3-5 steps with a railing? : Total 6 Click Score: 6    End of Session Equipment Utilized During Treatment: Oxygen Activity Tolerance: Patient tolerated treatment well Patient left: in bed;with call bell/phone within reach Nurse Communication: Mobility status PT Visit Diagnosis: Muscle weakness (generalized) (M62.81);Difficulty in walking, not elsewhere classified (R26.2);Other abnormalities of gait and mobility (R26.89)     Time: 2122-4825 PT Time Calculation (min) (ACUTE ONLY): 24 min  Charges:  $Therapeutic Activity:  8-22 mins $Neuromuscular Re-education: 8-22 mins                     08/17/2019  Jacinto Halim., PT Acute Rehabilitation Services [phone]  (pager) [phone]  (office)   George Kelly 08/17/2019, 2:04 PM

## 2019-08-17 NOTE — Procedures (Signed)
Cortrak  Tube Type:  Cortrak - 43 inches Tube Location:  Right nare Initial Placement:  Stomach Secured by: Bridle Technique Used to Measure Tube Placement:  Documented cm marking at nare/ corner of mouth Cortrak Secured At:  70 cm    Cortrak Tube Team Note:  Consult received to place a Cortrak feeding tube.   X-ray is required, abdominal x-ray has been ordered by the Cortrak team. Please confirm tube placement before using the Cortrak tube.   If the tube becomes dislodged please keep the tube and contact the Cortrak team at [email or web address] (password [email or web address]) for replacement.  If after hours and replacement cannot be delayed, place a NG tube and confirm placement with an abdominal x-ray.    Betsey Holiday MS, RD, LDN Please refer to [facility] for RD and/or RD on-call/weekend/after hours pager

## 2019-08-17 NOTE — Progress Notes (Signed)
SLP Cancellation Note  Patient Details Name: George Kelly MRN: [id number] DOB: 06/15/1990   Cancelled treatment:       Reason Eval/Treat Not Completed: Fatigue/lethargy limiting ability to participate. Pt is now doing some TC but per RN is still on sedation, not very alert. Discussed plan to f/u early next week for PMV evaluation. Please contact weekend pager [phone]) if SLP is needed before then.     Mahala Menghini., M.A. CCC-SLP Acute Rehabilitation Services Pager [phone] Office [phone]  08/17/2019, 2:37 PM

## 2019-08-17 NOTE — Progress Notes (Addendum)
NAME:  George Kelly, MRN:  [id number], DOB:  06/15/1990, LOS: 23 ADMISSION DATE:  07/25/2019, CONSULTATION DATE:  07/25/2019 REFERRING MD:  Dr. Renaye Rakers, CHIEF COMPLAINT:  Cardiac arrest    Brief History   29 yo male found unresponsive with probable drug overdose.  PEA arrest with ROSC after about 15 minutes.  Found to have lactic acidosis, AKI from rhabdomyolysis, aspiration pneumonia, shock liver, hypothermia.  UDS positive for cocaine, benzo's, THC.       Past Medical History  Substance abuse   Significant Hospital Events   6/09 Admit, start TTM and LTM 6/10 CRRT 6/11 off CRRT, rewarming from TTM 6/12 A fib with RVR >> amiodarone 6/25 attempting to wean off IV meds-Precedex-still no happy medium between sedation and agitation  Consults:  Nephrology - signed off 6/12  Procedures:  ETT 6/09 >> 6/21 Rt chest tube 6/09 >> out Lt radial a line 6/09 >> out  Rt [place] CVL 6/09 >> 6/23  Rt femoral HD cath 6/09 >> 6/12 LUE PICC 6/23 >> Trach 6/21 >>   Significant Diagnostic Tests:   CT head 6/09 >> no acute findings  CT chest 6/09 >> 10% Rt PTX, diffuse tree in bud and nodular opacities, debris within b/l mainstem bronchi, mucous plugging lower lobes  CT abd/pelvis 6/09 >> normal  Renal u/s 6/10 >> echogenic kidney b/l  Echo 6/10 >> EF 55 to 60%, grade 2 DD  CT Head 6/14 >> No acute intracranial abnormality.  CT Chest, abd, pelvis 6/14 >> Extensive bilateral airspace opacities, worsening since prior study.Dense consolidation in both lower lobes with small bilateral effusions. Right chest tube in place. Small residual right anterior pneumothorax. No acute findings in the abdomen or pelvis.   Micro Data:  COVID 6/9 > negative Blood culture 6/9 > lactobacillus Urine culture 6/9 > negative BCx2 6/14 >> negative Resp 6/15 >> enterobacter BC 6/21 >> negative Tracheal aspirate 6/21 >> negative  Antimicrobials:  Zosyn 6/09 >> 6/14 Vancomycin 6/09 >> 6/10 Zyvox 6/11 linezolid  6/15 >> 6/16 merrem 6/15 >> 6/16 Cefepime 6/17 >> 6/23  Interim history/subjective:  No fevers since 6/28. Patient removed CorTrak 6/30. Plan to place NG tube 08/16/19.   Objective   Blood pressure 136/76, pulse 87, temperature 99.3 F (37.4 C), temperature source Axillary, resp. rate 19, height 6\' 1"  (1.854 m), weight 79.3 kg, SpO2 95 %.    Vent Mode: PCV FiO2 (%):  [35 %-40 %] 35 % Set Rate:  [22 bmp] 22 bmp PEEP:  [5 cmH20] 5 cmH20 Plateau Pressure:  [20 cmH20-26 cmH20] 26 cmH20   Intake/Output Summary (Last 24 hours) at 08/17/2019 0741 Last data filed at 08/17/2019 10/18/2019 Gross per 24 hour  Intake 2258.57 ml  Output 3000 ml  Net -741.43 ml   Filed Weights   08/15/19 0500 08/16/19 0500 08/17/19 0500  Weight: 80.5 kg 78 kg 79.3 kg    Examination:  General - male appearing stated age, lying in bed in NAD with eyes closed  Eyes - closed, will open intermittently  ENT - tracheostomy collar in place  Cardiac - RRR without murmur  Chest - CTAB without crackles or wheezing Abdomen - soft, NT Extremities - no LE edema Skin - no rashes   Neuro -alert intermittently, sleeping through most of exam   Resolved/Stable Hospital Problem list   Hyperkalemia, Anion gap metabolic acidosis with lactic acidosis, Hypoglycemia, Septic shock, Reactive Afib/RVR, Thrombocytopenia from sepsis, AKI from ATN 2nd to sepsis, Elevated LFTs from hypotension, Gastritis, Hypernatremia,  Aspiration pneumonia with Enterobacter (completed ABx 6/23), Rt pneumothorax after CPR  Assessment & Plan:   Acute Hypoxic Respiratory Failure in setting of Cardiac Arrest and Aspiration Pneumonia - tracheostomy collar 7/2  - pressure support wean to TC as able - trach care, consider trach collar depending of patient toleration - CXR intermittently - will likely need LTAC placement if qualifies  Nutrition - NG placed   Intermittent Fever, resolved  Afebrile overnight, WBC down trending as of 7/1  - no other signs of  infection - prn tylenol, ibuprofen  Acute metabolic encephalopathy 2nd to anoxia, sepsis 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 - UDS positive for THC, cocaine, benzodiazepines  - wean precedex for RASS goal 0 to -1, -2 this morning  - increase fentanyl patch to 100 mcg from 50 mcg  - PRN Haldol  - continue klonopin, catapres, duragesic, oxycodone, seroquel, zoloft - continue propranolol  Hypertension - continue catapres, labetalol - BP within goal since 1500 on 7/1  - goal SBP < 150  Anemia of critical illness - plan for CBC 7/3  - transfuse for Hb < 7 or significant bleeding  Deconditioning - will need LTAC if not able to wean off vent - otherwise might be candidate for CIR   Best practice:  Diet:  TF DVT prophylaxis: lovenox GI prophylaxis: protonix Mobility: As tolerated Code Status: Full Disposition: ICU  Family Update: treatment plan update discussed with patient's mother via telephone. 7/2  Labs:   CMP Latest Ref Rng & Units 08/16/2019 08/14/2019 08/12/2019  Glucose 70 - 99 mg/dL 983(J) 825(K) 539(J)  BUN 6 - 20 mg/dL 16 17 20   Creatinine 0.61 - 1.24 mg/dL 6.73 4.19  Sodium 135 - 145 mmol/L 140 140 140  Potassium 3.5 - 5.1 mmol/L 3.9 4.1 3.5  Chloride 98 - 111 mmol/L 104 103 105  CO2 22 - 32 mmol/L 30 28 26   Calcium 8.9 - 10.3 mg/dL 3.79) ) 7.9(L)  Total Protein 6.5 - 8.1 g/dL - - 6.5  Total Bilirubin 0.3 - 1.2 mg/dL - - 0.8  Alkaline Phos 38 - 126 U/L - - 197(H)  AST 15 - 41 U/L - - 56(H)  ALT 0 - 44 U/L - - 71(H)    CBC Latest Ref Rng & Units 08/16/2019 08/14/2019 08/12/2019  WBC 4.0 - 10.5 K/uL 12.9(H) 17.1(H) 13.3(H)  Hemoglobin 13.0 - 17.0 g/dL 08/16/2019) 08/14/2019) 3.5(H)  Hematocrit 39 - 52 % 28.4(L) 28.6(L) 26.6(L)  Platelets 150 - 400 K/uL 366 419(H) 469(H)    ABG    Component Value Date/Time   PHART 7.438 08/11/2019 1130   PCO2ART 41.8 08/11/2019 1130   PO2ART 84 08/11/2019 1130   HCO3 28.1 (H) 08/11/2019 1130   TCO2 29 08/11/2019  1130   ACIDBASEDEF 6.0 (H) 07/26/2019 1121   O2SAT 96.0 08/11/2019 1130    CBG (last 3)  Recent Labs    08/16/19 2311 08/17/19 0329 08/17/19 0734  GLUCAP 95 98 109*    Signature:   10/18/19, MD  [facility] Family Medicine Residency PGY-2  08/17/2019, 7:41 AM

## 2019-08-17 NOTE — Progress Notes (Signed)
Trach care performed. Pt tolerated it well. RT will continue to monitor.

## 2019-08-18 ENCOUNTER — Inpatient Hospital Stay (HOSPITAL_COMMUNITY): Payer: Self-pay

## 2019-08-18 LAB — CBC WITH DIFFERENTIAL/PLATELET
Abs Immature Granulocytes: 0.16 10*3/uL — ABNORMAL HIGH (ref 0.00–0.07)
Basophils Absolute: 0.1 10*3/uL (ref 0.0–0.1)
Basophils Relative: 0 %
Eosinophils Absolute: 0.8 10*3/uL — ABNORMAL HIGH (ref 0.0–0.5)
Eosinophils Relative: 5 %
HCT: 29.1 % — ABNORMAL LOW (ref 39.0–52.0)
Hemoglobin: 9.4 g/dL — ABNORMAL LOW (ref 13.0–17.0)
Immature Granulocytes: 1 %
Lymphocytes Relative: 14 %
Lymphs Abs: 2.1 10*3/uL (ref 0.7–4.0)
MCH: 28.1 pg (ref 26.0–34.0)
MCHC: 32.3 g/dL (ref 30.0–36.0)
MCV: 87.1 fL (ref 80.0–100.0)
Monocytes Absolute: 1.1 10*3/uL — ABNORMAL HIGH (ref 0.1–1.0)
Monocytes Relative: 7 %
Neutro Abs: 11.3 10*3/uL — ABNORMAL HIGH (ref 1.7–7.7)
Neutrophils Relative %: 73 %
Platelets: 419 10*3/uL — ABNORMAL HIGH (ref 150–400)
RBC: 3.34 MIL/uL — ABNORMAL LOW (ref 4.22–5.81)
RDW: 14.6 % (ref 11.5–15.5)
WBC: 15.6 10*3/uL — ABNORMAL HIGH (ref 4.0–10.5)
nRBC: 0 % (ref 0.0–0.2)

## 2019-08-18 LAB — BASIC METABOLIC PANEL
Anion gap: 10 (ref 5–15)
BUN: 18 mg/dL (ref 6–20)
CO2: 30 mmol/L (ref 22–32)
Calcium: 9.2 mg/dL (ref 8.9–10.3)
Chloride: 101 mmol/L (ref 98–111)
Creatinine, Ser: 1.09 mg/dL (ref 0.61–1.24)
GFR calc Af Amer: >60 mL/min (ref >60)
GFR calc non Af Amer: >60 mL/min (ref >60)
Glucose, Bld: 112 mg/dL — ABNORMAL HIGH (ref 70–99)
Potassium: 4.3 mmol/L (ref 3.5–5.1)
Sodium: 141 mmol/L (ref 135–145)

## 2019-08-18 LAB — GLUCOSE, CAPILLARY
Glucose-Capillary: 101 mg/dL — ABNORMAL HIGH (ref 70–99)
Glucose-Capillary: 103 mg/dL — ABNORMAL HIGH (ref 70–99)
Glucose-Capillary: 109 mg/dL — ABNORMAL HIGH (ref 70–99)
Glucose-Capillary: 141 mg/dL — ABNORMAL HIGH (ref 70–99)
Glucose-Capillary: 99 mg/dL (ref 70–99)

## 2019-08-18 LAB — PROCALCITONIN: Procalcitonin: 0.33 ng/mL

## 2019-08-18 NOTE — Progress Notes (Addendum)
NAME:  George Kelly, MRN:  [id number], DOB:  06/15/1990, LOS: 24 ADMISSION DATE:  07/25/2019, CONSULTATION DATE:  07/25/2019 REFERRING MD:  Dr. Renaye Rakers, CHIEF COMPLAINT:  Cardiac arrest    Brief History   29 yo male found unresponsive with probable drug overdose.  PEA arrest with ROSC after about 15 minutes.  Found to have lactic acidosis, AKI from rhabdomyolysis, aspiration pneumonia, shock liver, hypothermia.  UDS positive for cocaine, benzo's, THC.       Past Medical History  Substance abuse   Significant Hospital Events   6/09 Admit, start TTM and LTM 6/10 CRRT 6/11 off CRRT, rewarming from TTM 6/12 A fib with RVR >> amiodarone 6/25 attempting to wean off IV meds-Precedex-still no happy medium between sedation and agitation  Consults:  Nephrology - signed off 6/12  Procedures:  ETT 6/09 >> 6/21 Rt chest tube 6/09 >> out Lt radial a line 6/09 >> out  Rt [place] CVL 6/09 >> 6/23  Rt femoral HD cath 6/09 >> 6/12 LUE PICC 6/23 >> Trach 6/21 >>   Significant Diagnostic Tests:   CT head 6/09 >> no acute findings  CT chest 6/09 >> 10% Rt PTX, diffuse tree in bud and nodular opacities, debris within b/l mainstem bronchi, mucous plugging lower lobes  CT abd/pelvis 6/09 >> normal  Renal u/s 6/10 >> echogenic kidney b/l  Echo 6/10 >> EF 55 to 60%, grade 2 DD  CT Head 6/14 >> No acute intracranial abnormality.  CT Chest, abd, pelvis 6/14 >> Extensive bilateral airspace opacities, worsening since prior study.Dense consolidation in both lower lobes with small bilateral effusions. Right chest tube in place. Small residual right anterior pneumothorax. No acute findings in the abdomen or pelvis.   Micro Data:  COVID 6/9 > negative Blood culture 6/9 > lactobacillus Urine culture 6/9 > negative BCx2 6/14 >> negative Resp 6/15 >> enterobacter BC 6/21 >> negative Tracheal aspirate 6/21 >> negative  Antimicrobials:  Zosyn 6/09 >> 6/14 Vancomycin 6/09 >> 6/10 Zyvox 6/11 linezolid  6/15 >> 6/16 merrem 6/15 >> 6/16 Cefepime 6/17 >> 6/23  Interim history/subjective:  No fevers since 6/28. Patient removed CorTrak 6/30. Plan to place NG tube 08/16/19.   Objective   Blood pressure 115/72, pulse 74, temperature 99.1 F (37.3 C), temperature source Oral, resp. rate 14, height 6\' 1"  (1.854 m), weight 77.5 kg, SpO2 95 %.    FiO2 (%):  [28 %-35 %] 35 %   Intake/Output Summary (Last 24 hours) at 08/18/2019 10/19/2019 Last data filed at 08/18/2019 0800 Gross per 24 hour  Intake 2876.31 ml  Output 1768 ml  Net 1108.31 ml   Filed Weights   08/16/19 0500 08/17/19 0500 08/18/19 0100  Weight: 78 kg 79.3 kg 77.5 kg    Examination:  General - male sleeping in bed with tracheostomy collar in place  Eyes - closed and intermittently opens to voice during exam  ENT - MMM appear moist  Cardiac - RRR without murmur  Chest - lung fields sound clear  Abdomen - soft and non-tender  Extremities - no edema  Skin - no rashes  Neuro -intermittently alert, does not respond to commands this AM   Resolved/Stable Hospital Problem list   Hyperkalemia, Anion gap metabolic acidosis with lactic acidosis, Hypoglycemia, Septic shock, Reactive Afib/RVR, Thrombocytopenia from sepsis, AKI from ATN 2nd to sepsis, Elevated LFTs from hypotension, Gastritis, Hypernatremia, Aspiration pneumonia with Enterobacter (completed ABx 6/23), Rt pneumothorax after CPR  Assessment & Plan:   Acute Hypoxic Respiratory Failure  in setting of Cardiac Arrest and Aspiration Pneumonia - tracheostomy collar 7/2  - trach care for tracheostomy collar  - PT recommending CIR   Nutrition - CorTrak with tube feeds    Fever Fever of 101.4 7/2 afternoon, afebrile since. WBC increased to 15 from 12.  - monitor WBC count with serial CBC  - CXR - monitor fever curve  - prn tylenol, ibuprofen  Acute metabolic encephalopathy 2nd to anoxia, sepsis 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 - UDS positive for THC, cocaine,  benzodiazepines  - wean precedex for RASS goal 0 to -1, set at 0.85mcg now  -  fentanyl patch  100  - PRN Haldol  - continue klonopin 3mg  BID, catapres, duragesic, oxycodone q6 hrs, seroquel, zoloft - continue propranolol  Hypertension BP within goal  - continue catapres, labetalol - BP within goal since 1500 on 7/1  - goal SBP < 150  Anemia of critical illness - hgb mildly increased from previous to 9.4  - transfuse for Hb < 7 or significant bleeding  Deconditioning - recommended for CIR    Best practice:  Diet:  TF DVT prophylaxis: lovenox GI prophylaxis: protonix Mobility: As tolerated Code Status: Full Disposition: ICU  Family Update: mother updated via telephone on 7/2  Labs:   CMP Latest Ref Rng & Units 08/18/2019 08/16/2019 08/14/2019  Glucose 70 - 99 mg/dL 08/16/2019) 034(J) 179(X)  BUN 6 - 20 mg/dL 18 16 17   Creatinine 0.61 - 1.24 mg/dL 505(W 9.79  Sodium 135 - 145 mmol/L 141 140 140  Potassium 3.5 - 5.1 mmol/L 4.3 3.9 4.1  Chloride 98 - 111 mmol/L 101 104 103  CO2 22 - 32 mmol/L 30 30 28   Calcium 8.9 - 10.3 mg/dL 9.2 4.80) 1.65)  Total Protein 6.5 - 8.1 g/dL - - -  Total Bilirubin 0.3 - 1.2 mg/dL - - -  Alkaline Phos 38 - 126 U/L - - -  AST 15 - 41 U/L - - -  ALT 0 - 44 U/L - - -    CBC Latest Ref Rng & Units 08/18/2019 08/16/2019 08/14/2019  WBC 4.0 - 10.5 K/uL 15.6(H) 12.9(H) 17.1(H)  Hemoglobin 13.0 - 17.0 g/dL 10/19/2019) 10/17/2019) 08/16/2019)  Hematocrit 39 - 52 % 29.1(L) 28.4(L) 28.6(L)  Platelets 150 - 400 K/uL 419(H) 366 419(H)    ABG    Component Value Date/Time   PHART 7.438 08/11/2019 1130   PCO2ART 41.8 08/11/2019 1130   PO2ART 84 08/11/2019 1130   HCO3 28.1 (H) 08/11/2019 1130   TCO2 29 08/11/2019 1130   ACIDBASEDEF 6.0 (H) 07/26/2019 1121   O2SAT 96.0 08/11/2019 1130    CBG (last 3)  Recent Labs    08/17/19 1919 08/17/19 2315 08/18/19 0757  GLUCAP 107* 124* 141*    Signature:   10/18/19, MD  [facility]  Residency PGY-2  08/18/2019, 9:24 AM

## 2019-08-18 NOTE — Progress Notes (Signed)
At bedside to assess PICC, spoke with [facility] RN re CXR results and PICC placement.  PICC insertion site WNL, PICC remains at 0 cm, as documented at time of placement, with biopatch in place. No possible way to advance PICC 7-8 cm since insertion as indicated on radiology report. Reviewed ECG with PICC insertion technology, current ECG on monitor remains NSR without any ectopy noted while at bedside or seen by [facility] RN this shift.  Reviewed CXR results and angles noted of image and position of George Kelly.  Will leave PICC in current placement due to no clinical signs noted to retract and ECG technology used to insert.  Securechat sent to Dr Briant Sites re findings and reason to not change PICC at this time.

## 2019-08-18 NOTE — Progress Notes (Addendum)
The radiologist called to inform this RN that the patient's PICC line needed to be pulled back about 8 cm because according to the most recent chest xray it appeared the PICC line had been advanced. This RN called the IV team to ask them to come assess the PICC line because I was informed by the radiologist that the PICC line needed to be pulled back 8 cm. Dr. Gaynell Face informed. IV team consult placed.

## 2019-08-18 NOTE — Progress Notes (Signed)
[facility]  Called by IV team re CXR showing PICC line tip in right atrium. Compared to prior CXR the tip appears to be in a different position but suspect this may be due to lordotic positioning.  No tele changes, no ectopy. Repeat CXR in AM, continue current management  Heber Colfax, MD [place] [facility] Pager: [phone] Cell: [phone] If no response, call [phone]

## 2019-08-18 NOTE — Progress Notes (Signed)
Trach care performed on pt. Pt tolerated well. RT will continue to monitor.

## 2019-08-18 NOTE — Progress Notes (Addendum)
Spoke with Shawna Orleans RN re PICC again. No response from Dr Gaynell Face.  Dr Kendrick Fries notified and reviewed CXR. Agree with this RN assessment and no need to retract PICC.  Securechat sent to [employer] to notify.   UPDATE: Dr Council Mechanic was not on service after 1pm when RN was attempting to reach thus the no response that is documented above. Appreciate RN contacting physician that was on service.

## 2019-08-19 ENCOUNTER — Inpatient Hospital Stay (HOSPITAL_COMMUNITY): Payer: Self-pay

## 2019-08-19 LAB — BASIC METABOLIC PANEL
Anion gap: 9 (ref 5–15)
BUN: 17 mg/dL (ref 6–20)
CO2: 31 mmol/L (ref 22–32)
Calcium: 9.2 mg/dL (ref 8.9–10.3)
Chloride: 99 mmol/L (ref 98–111)
Creatinine, Ser: 0.97 mg/dL (ref 0.61–1.24)
GFR calc Af Amer: >60 mL/min (ref >60)
GFR calc non Af Amer: >60 mL/min (ref >60)
Glucose, Bld: 119 mg/dL — ABNORMAL HIGH (ref 70–99)
Potassium: 4.2 mmol/L (ref 3.5–5.1)
Sodium: 139 mmol/L (ref 135–145)

## 2019-08-19 LAB — CBC
HCT: 31.1 % — ABNORMAL LOW (ref 39.0–52.0)
Hemoglobin: 9.9 g/dL — ABNORMAL LOW (ref 13.0–17.0)
MCH: 27.9 pg (ref 26.0–34.0)
MCHC: 31.8 g/dL (ref 30.0–36.0)
MCV: 87.6 fL (ref 80.0–100.0)
Platelets: 423 10*3/uL — ABNORMAL HIGH (ref 150–400)
RBC: 3.55 MIL/uL — ABNORMAL LOW (ref 4.22–5.81)
RDW: 14.2 % (ref 11.5–15.5)
WBC: 13.8 10*3/uL — ABNORMAL HIGH (ref 4.0–10.5)
nRBC: 0 % (ref 0.0–0.2)

## 2019-08-19 LAB — GLUCOSE, CAPILLARY
Glucose-Capillary: 115 mg/dL — ABNORMAL HIGH (ref 70–99)
Glucose-Capillary: 101 mg/dL — ABNORMAL HIGH (ref 70–99)
Glucose-Capillary: 95 mg/dL (ref 70–99)
Glucose-Capillary: 101 mg/dL — ABNORMAL HIGH (ref 70–99)
Glucose-Capillary: 95 mg/dL (ref 70–99)

## 2019-08-19 LAB — PROCALCITONIN: Procalcitonin: 0.33 ng/mL

## 2019-08-19 LAB — MAGNESIUM: Magnesium: 1.7 mg/dL (ref 1.7–2.4)

## 2019-08-19 MED ORDER — FENTANYL 100 MCG/HR TD PT72
1.0000 | MEDICATED_PATCH | TRANSDERMAL | Status: DC
  Administered 2019-08-19: 1 via TRANSDERMAL
  Filled 2019-08-19: qty 1

## 2019-08-19 MED ORDER — CLONAZEPAM 1 MG PO TABS
3.0000 mg | ORAL_TABLET | Freq: Three times a day (TID) | ORAL | Status: DC
  Administered 2019-08-19 – 2019-08-30 (×34): 3 mg
  Filled 2019-08-19 (×21): qty 3
  Filled 2019-08-19: qty 6
  Filled 2019-08-19 (×6): qty 3
  Filled 2019-08-19: qty 6
  Filled 2019-08-19 (×3): qty 3
  Filled 2019-08-19: qty 6
  Filled 2019-08-19: qty 3

## 2019-08-19 MED ORDER — MAGNESIUM SULFATE 2 GM/50ML IV SOLN
2.0000 g | Freq: Once | INTRAVENOUS | Status: AC
  Administered 2019-08-19: 2 g via INTRAVENOUS
  Filled 2019-08-19: qty 50

## 2019-08-19 NOTE — Progress Notes (Signed)
RT performed trach care on pt. Inner cannula and gauze changed at this time. Pt tolerated well. RT will continue to monitor.

## 2019-08-19 NOTE — Progress Notes (Signed)
NAME:  George Kelly, MRN:  [id number], DOB:  06/15/1990, LOS: 25 ADMISSION DATE:  07/25/2019, CONSULTATION DATE:  07/25/2019 REFERRING MD:  Dr. Renaye Rakers, CHIEF COMPLAINT:  Cardiac arrest    Brief History   29 yo male found unresponsive with probable drug overdose.  PEA arrest with ROSC after about 15 minutes.  Found to have lactic acidosis, AKI from rhabdomyolysis, aspiration pneumonia, shock liver, hypothermia.  UDS positive for cocaine, benzo's, THC.       Past Medical History  Substance abuse   Significant Hospital Events   6/09 Admit, start TTM and LTM 6/10 CRRT 6/11 off CRRT, rewarming from TTM 6/12 A fib with RVR >> amiodarone 6/25 attempting to wean off IV meds-Precedex-still no happy medium between sedation and agitation  Consults:  Nephrology - signed off 6/12  Procedures:  ETT 6/09 >> 6/21 Rt chest tube 6/09 >> out Lt radial a line 6/09 >> out  Rt [place] CVL 6/09 >> 6/23  Rt femoral HD cath 6/09 >> 6/12 LUE PICC 6/23 >> Trach 6/21 >>   Significant Diagnostic Tests:   CT head 6/09 >> no acute findings  CT chest 6/09 >> 10% Rt PTX, diffuse tree in bud and nodular opacities, debris within b/l mainstem bronchi, mucous plugging lower lobes  CT abd/pelvis 6/09 >> normal  Renal u/s 6/10 >> echogenic kidney b/l  Echo 6/10 >> EF 55 to 60%, grade 2 DD  CT Head 6/14 >> No acute intracranial abnormality.  CT Chest, abd, pelvis 6/14 >> Extensive bilateral airspace opacities, worsening since prior study.Dense consolidation in both lower lobes with small bilateral effusions. Right chest tube in place. Small residual right anterior pneumothorax. No acute findings in the abdomen or pelvis.   Micro Data:  COVID 6/9 > negative Blood culture 6/9 > lactobacillus Urine culture 6/9 > negative BCx2 6/14 >> negative Resp 6/15 >> enterobacter BC 6/21 >> negative Tracheal aspirate 6/21 >> negative resp cx 7/3:  Antimicrobials:  Zosyn 6/09 >> 6/14 Vancomycin 6/09 >> 6/10 Zyvox  6/11 linezolid 6/15 >> 6/16 merrem 6/15 >> 6/16 Cefepime 6/17 >> 6/23  Interim history/subjective:  7/4: pt restless this am on precedex, have been unable to wean. Still with copious secretions. Gram stain with gpc but fever curve improving as is wbc and pct low. Will cont to hold on abx for now.   Objective   Blood pressure 132/86, pulse 92, temperature 98.7 F (37.1 C), temperature source Oral, resp. rate 15, height 6\' 1"  (1.854 m), weight 76.2 kg, SpO2 (!) 89 %.    FiO2 (%):  [35 %] 35 %   Intake/Output Summary (Last 24 hours) at 08/19/2019 0943 Last data filed at 08/19/2019 0900 Gross per 24 hour  Intake 2222.3 ml  Output 2740 ml  Net -517.7 ml   Filed Weights   08/17/19 0500 08/18/19 0100 08/19/19 0423  Weight: 79.3 kg 77.5 kg 76.2 kg    Examination:  General - male restless in bed with tracheostomy collar in place  Eyes - closed and intermittently opens to voice during exam  ENT - mm pink but appear dry Cardiac - RRR without murmur  Chest - lung fields sound clear, coarse upper airway sounds Abdomen - soft and non-tender  Extremities - no edema  Skin - no rashes  Neuro -intermittently alert, and intermittently following commands.   Resolved/Stable Hospital Problem list   Hyperkalemia, Anion gap metabolic acidosis with lactic acidosis, Hypoglycemia, Septic shock, Reactive Afib/RVR, Thrombocytopenia from sepsis, AKI from ATN 2nd to  sepsis, Elevated LFTs from hypotension, Gastritis, Hypernatremia, Aspiration pneumonia with Enterobacter (completed ABx 6/23), Rt pneumothorax after CPR  Assessment & Plan:   Acute Hypoxic Respiratory Failure in setting of Cardiac Arrest and Aspiration Pneumonia - tracheostomy collar since 7/2  - trach care for tracheostomy collar  - PT recommending CIR   Nutrition - CorTrak with tube feeds    Fever defervescing   - WBC decreasing to 13 - monitor WBC count with serial CBC  - CXR, stable personally reviewed by me -f/u resp cx (gpc in  gram stain) -pct is low - prn tylenol, ibuprofen  Acute metabolic encephalopathy 2nd to anoxia, sepsis 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 - UDS positive for THC, cocaine, benzodiazepines  - wean precedex for RASS goal 0 to -1, -  fentanyl patch  100  - PRN Haldol  - increase klonopin to 3mg  TID in efforts to wean off precedex -cont catapres, duragesic, oxycodone q6 hrs, seroquel, zoloft - continue propranolol  Hypertension BP within goal  - continue catapres, labetalol - BP within goal since 1500 on 7/1  - goal SBP < 150  Anemia of critical illness - hgb stable - transfuse for Hb < 7 or significant bleeding  Deconditioning - recommended for CIR    Best practice:  Diet:  TF DVT prophylaxis: lovenox GI prophylaxis: protonix Mobility: As tolerated Code Status: Full Disposition: ICU until weaned from precedex Family Update: mother updated via telephone on 7/4 Labs:   CMP Latest Ref Rng & Units 08/19/2019 08/18/2019 08/16/2019  Glucose 70 - 99 mg/dL 10/17/2019) 433(I) 951(O)  BUN 6 - 20 mg/dL 17 18 16   Creatinine 0.61 - 1.24 mg/dL 841(Y 6.06  Sodium 135 - 145 mmol/L 139 141 140  Potassium 3.5 - 5.1 mmol/L 4.2 4.3 3.9  Chloride 98 - 111 mmol/L 99 101 104  CO2 22 - 32 mmol/L 31 30 30   Calcium 8.9 - 10.3 mg/dL 9.2 9.2 3.01)  Total Protein 6.5 - 8.1 g/dL - - -  Total Bilirubin 0.3 - 1.2 mg/dL - - -  Alkaline Phos 38 - 126 U/L - - -  AST 15 - 41 U/L - - -  ALT 0 - 44 U/L - - -    CBC Latest Ref Rng & Units 08/19/2019 08/18/2019 08/16/2019  WBC 4.0 - 10.5 K/uL 13.8(H) 15.6(H) 12.9(H)  Hemoglobin 13.0 - 17.0 g/dL 10/20/2019) 10/19/2019) 10/17/2019)  Hematocrit 39 - 52 % 31.1(L) 29.1(L) 28.4(L)  Platelets 150 - 400 K/uL 423(H) 419(H) 366    ABG    Component Value Date/Time   PHART 7.438 08/11/2019 1130   PCO2ART 41.8 08/11/2019 1130   PO2ART 84 08/11/2019 1130   HCO3 28.1 (H) 08/11/2019 1130   TCO2 29 08/11/2019 1130   ACIDBASEDEF 6.0 (H) 07/26/2019 1121   O2SAT 96.0 08/11/2019  1130    CBG (last 3)  Recent Labs    08/18/19 2344 08/19/19 0336 08/19/19 0726  GLUCAP 101* 115* 101*   Critical care time: The patient is critically ill with multiple organ systems failure and requires high complexity decision making for assessment and support, frequent evaluation and titration of therapies, application of advanced monitoring technologies and extensive interpretation of multiple databases.  Critical care time 34 mins. This represents my time independent of the NPs time taking care of the pt. This is excluding procedures.    10/19/19 DO [facility] 08/19/2019, 9:44 AM

## 2019-08-19 NOTE — Progress Notes (Signed)
Inpatient Rehab Admissions Coordinator:   Patient was screened by Megan Salon, MS CCC-SLP for appropriateness for an Inpatient Acute Rehab Consult per PT recs. Noted that Pt. Is on trach collar but still requiring heavy sedation d/t agitation. Will follow up once weaned off Precedex  Megan Salon, MS, CCC-SLP Rehab Admissions Coordinator  [phone] (celll) [phone] (office)

## 2019-08-19 NOTE — Progress Notes (Signed)
[facility] ADULT ICU REPLACEMENT PROTOCOL   The patient does apply for the [facility] Adult ICU Electrolyte Replacment Protocol based on the criteria listed below:   1. Is GFR >/= 30 ml/min? Yes.    Patient's GFR today is >60 2. Is SCr </= 2? Yes.   Patient's SCr is 0.97 ml/kg/hr 3. Did SCr increase >/= 0.5 in 24 hours?no 4. Abnormal electrolyte(s): mg 1.7 5. Ordered repletion with: protocol 6. If a panic level lab has been reported, has the CCM MD in charge been notified? No..   Physician:    Markus Daft A 08/19/2019 6:27 AM

## 2019-08-20 DIAGNOSIS — N170 Acute kidney failure with tubular necrosis: Secondary | ICD-10-CM

## 2019-08-20 LAB — GLUCOSE, CAPILLARY
Glucose-Capillary: 100 mg/dL — ABNORMAL HIGH (ref 70–99)
Glucose-Capillary: 104 mg/dL — ABNORMAL HIGH (ref 70–99)
Glucose-Capillary: 108 mg/dL — ABNORMAL HIGH (ref 70–99)
Glucose-Capillary: 111 mg/dL — ABNORMAL HIGH (ref 70–99)
Glucose-Capillary: 96 mg/dL (ref 70–99)
Glucose-Capillary: 98 mg/dL (ref 70–99)

## 2019-08-20 LAB — CBC
HCT: 30.9 % — ABNORMAL LOW (ref 39.0–52.0)
Hemoglobin: 9.8 g/dL — ABNORMAL LOW (ref 13.0–17.0)
MCH: 27.7 pg (ref 26.0–34.0)
MCHC: 31.7 g/dL (ref 30.0–36.0)
MCV: 87.3 fL (ref 80.0–100.0)
Platelets: 403 10*3/uL — ABNORMAL HIGH (ref 150–400)
RBC: 3.54 MIL/uL — ABNORMAL LOW (ref 4.22–5.81)
RDW: 14.1 % (ref 11.5–15.5)
WBC: 11.6 10*3/uL — ABNORMAL HIGH (ref 4.0–10.5)
nRBC: 0 % (ref 0.0–0.2)

## 2019-08-20 LAB — PROCALCITONIN: Procalcitonin: 0.27 ng/mL

## 2019-08-20 MED ORDER — OXYCODONE HCL 5 MG PO TABS
40.0000 mg | ORAL_TABLET | Freq: Three times a day (TID) | ORAL | Status: DC
  Administered 2019-08-20 – 2019-08-23 (×8): 40 mg
  Filled 2019-08-20 (×8): qty 8

## 2019-08-20 NOTE — Evaluation (Signed)
[facility] Speaking Valve - Evaluation Patient Details  Name: George Kelly MRN: [id number] Date of Birth: 06/15/1990  Today's Date: 08/20/2019 Time: 4098-1191 SLP Time Calculation (min) (ACUTE ONLY): 10 min  Past Medical History:  Past Medical History:  Diagnosis Date  . Cardiac arrest (HCC)   . Polysubstance abuse (HCC)    HPI:  29 yo male found unresponsive with probable drug overdose.  PEA arrest with ROSC after about 15 minutes.  Found to have lactic acidosis, AKI from rhabdomyolysis, aspiration pneumonia, shock liver, hypothermia, acute metabolic encephalopathy 2nd to anoxia, sepsis.  UDS positive for cocaine, benzo's, THC.   ETT 6/09-6/21; 6/21 tracheostomy. Has been tolerating trach collar since 7/2; has cortrak.   Assessment / Plan / Recommendation Clinical Impression  Pt participated in limited PMV assessment due to his lethargy.  Cuff deflation was ensured; valve was placed and removed at regular intervals to ensure no air trapping. Despite lack of volitional effort, there was good access to upper airway, and pt achieved intermittent voice noted while groaning, producing some unintelligible words, and eventually saying "stop it."  VS remained stable, with SP02 >95%, RR 17, HR ~74.  Pt opened eyes intermittently but did not follow commands.  Recommend nursing place valve with patient when providing care; remove when he is left alone.  SLP will follow for PMV toleration and swallow assessment when he is ready.  D/W RN.  SLP Visit Diagnosis: Aphonia (R49.1)    SLP Assessment  Patient needs continued Speech Lanaguage Pathology Services    Follow Up Recommendations  Inpatient Rehab    Frequency and Duration min 3x week  2 weeks    PMSV Trial PMSV was placed for: ten minutes Able to redirect subglottic air through upper airway: Yes Able to Attain Phonation: Yes Voice Quality: Wet Able to Expectorate Secretions: No attempts Breath Support for Phonation:  Inadequate Intelligibility: Intelligibility reduced Word: 50-74% accurate Phrase: Not tested Sentence: Not tested Conversation: Not tested Respirations During Trial: 17 SpO2 During Trial: 95 % Pulse During Trial: 74   Tracheostomy Tube  Additional Tracheostomy Tube Assessment Fenestrated: No    Vent Dependency  Vent Dependent: No    Cuff Deflation Trial  GO Tolerated Cuff Deflation:  (deflated at baseline)        Blenda Mounts Laurice 08/20/2019, 3:13 PM Quin[place] Prisco L. Samson Frederic, MA CCC/SLP Acute Rehabilitation Services Office number [phone] Pager [phone]

## 2019-08-20 NOTE — Progress Notes (Signed)
NAME:  George Kelly, MRN:  [id number], DOB:  06/15/1990, LOS: 26 ADMISSION DATE:  07/25/2019, CONSULTATION DATE:  07/25/2019 REFERRING MD:  Dr. Renaye Rakers, CHIEF COMPLAINT:  Cardiac arrest    Brief History   29 yo male found unresponsive with probable drug overdose.  PEA arrest with ROSC after about 15 minutes.  Found to have lactic acidosis, AKI from rhabdomyolysis, aspiration pneumonia, shock liver, hypothermia.  UDS positive for cocaine, benzo's, THC.       Past Medical History  Substance abuse   Significant Hospital Events   6/09 Admit, start TTM and LTM 6/10 CRRT 6/11 off CRRT, rewarming from TTM 6/12 A fib with RVR >> amiodarone 6/25 attempting to wean off IV meds-Precedex-still no happy medium between sedation and agitation 7/5: Precedex off, titration of medications for sedation   Consults:  Nephrology - signed off 6/12  Procedures:  ETT 6/09 >> 6/21 Rt chest tube 6/09 >> out Lt radial a line 6/09 >> out  Rt [place] CVL 6/09 >> 6/23  Rt femoral HD cath 6/09 >> 6/12 LUE PICC 6/23 >> Trach 6/21 >>   Significant Diagnostic Tests:   CT head 6/09 >> no acute findings  CT chest 6/09 >> 10% Rt PTX, diffuse tree in bud and nodular opacities, debris within b/l mainstem bronchi, mucous plugging lower lobes  CT abd/pelvis 6/09 >> normal  Renal u/s 6/10 >> echogenic kidney b/l  Echo 6/10 >> EF 55 to 60%, grade 2 DD  CT Head 6/14 >> No acute intracranial abnormality.  CT Chest, abd, pelvis 6/14 >> Extensive bilateral airspace opacities, worsening since prior study.Dense consolidation in both lower lobes with small bilateral effusions. Right chest tube in place. Small residual right anterior pneumothorax. No acute findings in the abdomen or pelvis.   Micro Data:  COVID 6/9 > negative Blood culture 6/9 > lactobacillus Urine culture 6/9 > negative BCx2 6/14 >> negative Resp 6/15 >> enterobacter BC 6/21 >> negative Tracheal aspirate 6/21 >> negative resp cx 7/3:  Antimicrobials:   Zosyn 6/09 >> 6/14 Vancomycin 6/09 >> 6/10 Zyvox 6/11 linezolid 6/15 >> 6/16 merrem 6/15 >> 6/16 Cefepime 6/17 >> 6/23  Interim history/subjective:  7/4: pt restless this am on precedex, have been unable to wean. Still with copious secretions. Gram stain with gpc but fever curve improving as is wbc and pct low. Will cont to hold on abx for now.   Objective   Blood pressure 108/66, pulse 73, temperature 98.9 F (37.2 C), temperature source Oral, resp. rate 10, height 6\' 1"  (1.854 m), weight 76.4 kg, SpO2 92 %.    Vent Mode: Stand-by FiO2 (%):  [28 %-35 %] 28 %   Intake/Output Summary (Last 24 hours) at 08/20/2019 0908 Last data filed at 08/20/2019 0800 Gross per 24 hour  Intake 2671.46 ml  Output 1300 ml  Net 1371.46 ml   Filed Weights   08/18/19 0100 08/19/19 0423 08/20/19 0416  Weight: 77.5 kg 76.2 kg 76.4 kg    Examination:  General - somnolent male lying in bed with eyes closed, does not appear to be in acute distress  Eyes - will open eyes to voice, no conjunctival injection or scleral icterus  ENT - MMM, tracheostomy collar Cardiac - RRR without murmur  Chest - no crackles or wheezing Abdomen - soft, does not appear to be tender to palpation  Extremities - no LE edema  Skin - no rashes noted, some hyperpigmentation on lateral aspect of right deltoid  Neuro - awakens to  voice, follows command to wiggle toes and open eyes   Resolved/Stable Hospital Problem list   Hyperkalemia, Anion gap metabolic acidosis with lactic acidosis, Hypoglycemia, Septic shock, Reactive Afib/RVR, Thrombocytopenia from sepsis, AKI from ATN 2nd to sepsis, Elevated LFTs from hypotension, Gastritis, Hypernatremia, Aspiration pneumonia with Enterobacter (completed ABx 6/23), Rt pneumothorax after CPR  Assessment & Plan:   Acute Hypoxic Respiratory Failure in setting of Cardiac Arrest and Aspiration Pneumonia - tracheostomy collar since 7/2  - trach care for tracheostomy collar  - PT recommending  CIR   Nutrition - CorTrak with tube feeds    Fever Afebrile overnight. wBC decreased to 11 from 13. Tracheal aspirate culture + for moderate staph aureus, moderate enterobacter aerogenes with pending susceptibilities. Procalcitonin 0.27 with CXR bilateral basal opacities, stable.   - f/u tracheal aspirate culture sensitivities  - monitor WBC count with serial CBC  - prn tylenol, ibuprofen  Acute metabolic encephalopathy 2nd to anoxia, sepsis 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 - UDS positive for THC, cocaine, benzodiazepines  - discontinued precedex morning of 7/5, will monitor patient's tolerance  - continue fentanyl patch  100  - PRN Haldol  -klonopin to 3mg  TID -continue clonidine, duragesic, oxycodone q6 hrs, seroquel, zoloft -continue propranolol  Hypertension BP within goal (max systolic 144/m,ax diastolic 96)  - continue catapres, labetalol - goal SBP < 150  Anemia of critical illness - hgb stable, 9.8 - transfuse for Hb < 7 or significant bleeding  Deconditioning - recommended for CIR, CIR to evaluate pending patient tolerates being off of Precedex    Best practice:  Diet:  TF DVT prophylaxis: lovenox GI prophylaxis: protonix Mobility: As tolerated Code Status: Full Disposition: ICU until weaned from precedex Family Update: mother updated via telephone on 7/4 Labs:   CMP Latest Ref Rng & Units 08/19/2019 08/18/2019 08/16/2019  Glucose 70 - 99 mg/dL 10/17/2019) 696(V) 893(Y)  BUN 6 - 20 mg/dL 17 18 16   Creatinine 0.61 - 1.24 mg/dL 101(B 5.10  Sodium 135 - 145 mmol/L 139 141 140  Potassium 3.5 - 5.1 mmol/L 4.2 4.3 3.9  Chloride 98 - 111 mmol/L 99 101 104  CO2 22 - 32 mmol/L 31 30 30   Calcium 8.9 - 10.3 mg/dL 9.2 9.2 2.58)  Total Protein 6.5 - 8.1 g/dL - - -  Total Bilirubin 0.3 - 1.2 mg/dL - - -  Alkaline Phos 38 - 126 U/L - - -  AST 15 - 41 U/L - - -  ALT 0 - 44 U/L - - -    CBC Latest Ref Rng & Units 08/20/2019 08/19/2019 08/18/2019  WBC 4.0 - 10.5  K/uL 11.6(H) 13.8(H) 15.6(H)  Hemoglobin 13.0 - 17.0 g/dL 10/21/2019) 10/20/2019) 10/19/2019)  Hematocrit 39 - 52 % 30.9(L) 31.1(L) 29.1(L)  Platelets 150 - 400 K/uL 403(H) 423(H) 419(H)    ABG    Component Value Date/Time   PHART 7.438 08/11/2019 1130   PCO2ART 41.8 08/11/2019 1130   PO2ART 84 08/11/2019 1130   HCO3 28.1 (H) 08/11/2019 1130   TCO2 29 08/11/2019 1130   ACIDBASEDEF 6.0 (H) 07/26/2019 1121   O2SAT 96.0 08/11/2019 1130    CBG (last 3)  Recent Labs    08/20/19 0001 08/20/19 0349 08/20/19 0722  GLUCAP 96 108* 98   Critical care time: 10/21/19   10/21/19, MD  [facility] Resident, PGY-2   08/20/2019, 9:08 AM

## 2019-08-20 NOTE — Progress Notes (Signed)
Unable to change trach today due to the trach arriving after 5:00 per policy. Will change tomorrow and pass on to night shift/day shift for tonight and tomorrow.

## 2019-08-20 NOTE — Progress Notes (Signed)
Occupational Therapy Treatment Patient Details Name: George Kelly MRN: [id number] DOB: 06/15/1990 Today's Date: 08/20/2019    History of present illness 29 yo male found unresponsive with probable drug overdose.  PEA arrest with ROSC after about 15 minutes.  Found to have lactic acidosis, AKI from rhabdomyolysis, aspiration pneumonia, shock liver, hypothermia.  UDS positive for cocaine, benzo's, THC.   6/17 tracheostomy.   OT comments  Pt continues to present with decreased arousal and limited participation. Pt requiring Max A for bed mobility and Max A +2 for functional transfers. Pt sitting at EOB with requiring Min-Max A for sitting balance. Challenging righting reactions and weight shifting. Using music to increased arousal. Opening eyes with Mod cues. VSS throughout on RA. Continue to recommend dc to CIR and will continue to follow acutely as admitted.    Follow Up Recommendations  CIR (pending progress may need LTACH)    Equipment Recommendations  Other (comment) (TBD)    Recommendations for Other Services      Precautions / Restrictions Precautions Precautions: Fall Precaution Comments: 4 pt restraints, condom cath,        Mobility Bed Mobility Overal bed mobility: Needs Assistance Bed Mobility: Sit to Supine     Supine to sit: Max assist;+2 for physical assistance     General bed mobility comments: still not aroused enough to coordinate movement  Transfers Overall transfer level: Needs assistance Equipment used:  (2 person face to face transfer) Transfers: Sit to/from [employer] Sit to Stand: Max assist;+2 physical assistance Stand pivot transfers: Max assist;+2 physical assistance;+2 safety/equipment       General transfer comment: use face to face and arms over therapist's shoulders to stand and pivot to the chair.  Pt attempting to stand spontaneously multiple times, but he still is not alert enough to coordinate standing and mobility     Balance Overall balance assessment: Needs assistance Sitting-balance support: Feet supported;Bilateral upper extremity supported Sitting balance-Leahy Scale: Poor Sitting balance - Comments: At EOB working on balance, coordinating trunk reactions. Postural control: Posterior lean Standing balance support: Bilateral upper extremity supported Standing balance-Leahy Scale: Zero                             ADL either performed or assessed with clinical judgement   ADL Overall ADL's : Needs assistance/impaired             Lower Body Bathing: Sit to/from stand;Total assistance;+2 for physical assistance Lower Body Bathing Details (indicate cue type and reason): Hand over hand to assist pt in applying lotion to BLEs.      Lower Body Dressing: Total assistance;Bed level Lower Body Dressing Details (indicate cue type and reason): donning socks Toilet Transfer: Maximal assistance;+2 for physical assistance;+2 for safety/equipment;Stand-pivot (simulated to recliner) Toilet Transfer Details (indicate cue type and reason): Max A to maintaining balance, achieve full upright posture, and block knees         Functional mobility during ADLs: Maximal assistance;+2 for physical assistance (stand pivot) General ADL Comments: Pt with decreased arousal and cognition. Poor balance and actiivty tolerance.      Vision       Perception     Praxis      Cognition Arousal/Alertness: Lethargic Behavior During Therapy: Flat affect Overall Cognitive Status: Difficult to assess  General Comments: Pt with decreased arousal. Pt opening his eyes with Mod cues. Pt with increased arousal after sitting at EOB. Using music to increase arousal.         Exercises     Shoulder Instructions       General Comments VSS on RA    Pertinent Vitals/ Pain       Pain Assessment: Faces Faces Pain Scale: No hurt Pain Intervention(s): Monitored  during session  Home Living                                          Prior Functioning/Environment              Frequency  Min 2X/week        Progress Toward Goals  OT Goals(current goals can now be found in the care plan section)  Progress towards OT goals: Progressing toward goals  Acute Rehab OT Goals Patient Stated Goal: none stated OT Goal Formulation: Patient unable to participate in goal setting Potential to Achieve Goals: Fair ADL Goals Pt Will Perform Grooming: with min assist;sitting Pt/caregiver will Perform Home Exercise Program: Increased strength;Increased ROM;Both right and left upper extremity;With minimal assist;With written HEP provided Additional ADL Goal #1: Pt will tolerate sitting EOB >7 min with minA as precursor to EOB/OOB ADL. Additional ADL Goal #2: Pt will follow 1 step simple command with 75% accuracy during ADL/functional task.  Plan Discharge plan remains appropriate    Co-evaluation    PT/OT/SLP Co-Evaluation/Treatment: Yes Reason for Co-Treatment: For patient/therapist safety;To address functional/ADL transfers PT goals addressed during session: Mobility/safety with mobility OT goals addressed during session: ADL's and self-care      AM-PAC OT "6 Clicks" Daily Activity     Outcome Measure   Help from another person eating meals?: Total Help from another person taking care of personal grooming?: Total Help from another person toileting, which includes using toliet, bedpan, or urinal?: Total Help from another person bathing (including washing, rinsing, drying)?: Total Help from another person to put on and taking off regular upper body clothing?: Total Help from another person to put on and taking off regular lower body clothing?: Total 6 Click Score: 6    End of Session Equipment Utilized During Treatment: Oxygen  OT Visit Diagnosis: Other abnormalities of gait and mobility (R26.89);Muscle weakness  (generalized) (M62.81);Other symptoms and signs involving cognitive function   Activity Tolerance Patient tolerated treatment well   Patient Left in chair;with call bell/phone within reach;with chair alarm set;with restraints reapplied;Other (comment) (chair alarm belt)   Nurse Communication Mobility status        Time: 5427-0623 OT Time Calculation (min): 35 min  Charges: OT General Charges $OT Visit: 1 Visit OT Treatments $Self Care/Home Management : 8-22 mins  Kerry-Anne Mezo MSOT, OTR/L Acute Rehab Pager: [phone] Office: [phone]   Theodoro Grist Fares Ramthun 08/20/2019, 1:22 PM

## 2019-08-20 NOTE — Progress Notes (Signed)
Physical Therapy Treatment Patient Details Name: George Kelly MRN: [id number] DOB: 06/15/1990 Today's Date: 08/20/2019    History of Present Illness 29 yo male found unresponsive with probable drug overdose.  PEA arrest with ROSC after about 15 minutes.  Found to have lactic acidosis, AKI from rhabdomyolysis, aspiration pneumonia, shock liver, hypothermia.  UDS positive for cocaine, benzo's, THC.   6/17 tracheostomy.    PT Comments    Pt still having trouble arousing through the PO med sedation.  Precedex is now discontinued.  Emphasis on arousing pt, transitions, sitting balance, multiple sit to stands and transfer to the chair.    Follow Up Recommendations  CIR;Supervision/Assistance - 24 hour     Equipment Recommendations  Other (comment)    Recommendations for Other Services       Precautions / Restrictions Precautions Precautions: Fall Precaution Comments: 4 pt restraints, condom cath,     Mobility  Bed Mobility Overal bed mobility: Needs Assistance Bed Mobility: Sit to Supine     Supine to sit: Max assist;+2 for physical assistance     General bed mobility comments: still not aroused enough to coordinate movement  Transfers Overall transfer level: Needs assistance   Transfers: Sit to/from Stand;Stand Pivot Transfers Sit to Stand: Max assist;+2 physical assistance Stand pivot transfers: Max assist;+2 physical assistance;+2 safety/equipment       General transfer comment: use face to face and arms over therapist's shoulders to stand and pivot to the chair.  Pt attempting to stand spontaneously multiple times, but he still is not alert enough to coordinate standing and mobility  Ambulation/Gait                 Stairs             Wheelchair Mobility    Modified Rankin (Stroke Patients Only)       Balance Overall balance assessment: Needs assistance   Sitting balance-Leahy Scale: Poor Sitting balance - Comments: At EOB working on  balance, coordinating trunk reactions.   Standing balance support: Bilateral upper extremity supported Standing balance-Leahy Scale: Zero                              Cognition Arousal/Alertness: Lethargic Behavior During Therapy: Flat affect Overall Cognitive Status: Impaired/Different from baseline (NT formally)                                        Exercises      General Comments        Pertinent Vitals/Pain Pain Assessment: Faces Faces Pain Scale: No hurt    Home Living                      Prior Function            PT Goals (current goals can now be found in the care plan section) Acute Rehab PT Goals PT Goal Formulation: With patient Time For Goal Achievement: 08/27/19 Potential to Achieve Goals: Fair Progress towards PT goals: Progressing toward goals    Frequency    Min 3X/week      PT Plan Current plan remains appropriate;Frequency needs to be updated    Co-evaluation PT/OT/SLP Co-Evaluation/Treatment: Yes Reason for Co-Treatment: Complexity of the patient's impairments (multi-system involvement);For patient/therapist safety PT goals addressed during session: Mobility/safety with mobility OT goals addressed during session: ADL's  and self-care;Strengthening/ROM      AM-PAC PT "6 Clicks" Mobility   Outcome Measure  Help needed turning from your back to your side while in a flat bed without using bedrails?: Total Help needed moving from lying on your back to sitting on the side of a flat bed without using bedrails?: Total Help needed moving to and from a bed to a chair (including a wheelchair)?: Total Help needed standing up from a chair using your arms (e.g., wheelchair or bedside chair)?: Total Help needed to walk in hospital room?: Total Help needed climbing 3-5 steps with a railing? : Total 6 Click Score: 6    End of Session Equipment Utilized During Treatment: Oxygen Activity Tolerance: Patient  tolerated treatment well Patient left: in chair;with call bell/phone within reach;with chair alarm set;with restraints reapplied Nurse Communication: Mobility status PT Visit Diagnosis: Muscle weakness (generalized) (M62.81);Difficulty in walking, not elsewhere classified (R26.2);Other abnormalities of gait and mobility (R26.89)     Time: 1287-8676 PT Time Calculation (min) (ACUTE ONLY): 38 min  Charges:  $Therapeutic Activity: 8-22 mins $Neuromuscular Re-education: 8-22 mins                     08/20/2019  Jacinto Halim., PT Acute Rehabilitation Services [phone]  (pager) [phone]  (office)   George Kelly 08/20/2019, 12:31 PM

## 2019-08-21 LAB — CULTURE, RESPIRATORY W GRAM STAIN

## 2019-08-21 LAB — CBC
HCT: 29.9 % — ABNORMAL LOW (ref 39.0–52.0)
Hemoglobin: 9.6 g/dL — ABNORMAL LOW (ref 13.0–17.0)
MCH: 27.9 pg (ref 26.0–34.0)
MCHC: 32.1 g/dL (ref 30.0–36.0)
MCV: 86.9 fL (ref 80.0–100.0)
Platelets: 311 10*3/uL (ref 150–400)
RBC: 3.44 MIL/uL — ABNORMAL LOW (ref 4.22–5.81)
RDW: 14 % (ref 11.5–15.5)
WBC: 11.3 10*3/uL — ABNORMAL HIGH (ref 4.0–10.5)
nRBC: 0 % (ref 0.0–0.2)

## 2019-08-21 LAB — GLUCOSE, CAPILLARY
Glucose-Capillary: 101 mg/dL — ABNORMAL HIGH (ref 70–99)
Glucose-Capillary: 105 mg/dL — ABNORMAL HIGH (ref 70–99)
Glucose-Capillary: 114 mg/dL — ABNORMAL HIGH (ref 70–99)
Glucose-Capillary: 82 mg/dL (ref 70–99)
Glucose-Capillary: 91 mg/dL (ref 70–99)
Glucose-Capillary: 96 mg/dL (ref 70–99)

## 2019-08-21 MED ORDER — SERTRALINE HCL 100 MG PO TABS: 100.0000 mg | ORAL_TABLET | Freq: Every day | ORAL | Status: DC

## 2019-08-21 MED ORDER — CHLORHEXIDINE GLUCONATE 0.12 % MT SOLN
15.0000 mL | Freq: Two times a day (BID) | OROMUCOSAL | Status: DC
  Administered 2019-08-21 – 2019-08-23 (×5): 15 mL via OROMUCOSAL
  Filled 2019-08-21 (×3): qty 15

## 2019-08-21 MED ORDER — ORAL CARE MOUTH RINSE
15.0000 mL | Freq: Two times a day (BID) | OROMUCOSAL | Status: DC
  Administered 2019-08-22 – 2019-08-23 (×3): 15 mL via OROMUCOSAL

## 2019-08-21 MED ORDER — MELATONIN 3 MG PO TABS
3.0000 mg | ORAL_TABLET | Freq: Every day | ORAL | Status: DC
  Administered 2019-08-21: 3 mg via ORAL
  Filled 2019-08-21: qty 1

## 2019-08-21 MED ORDER — VALPROIC ACID 250 MG/5ML PO SOLN
500.0000 mg | Freq: Three times a day (TID) | ORAL | Status: DC
  Administered 2019-08-21 – 2019-08-31 (×31): 500 mg
  Filled 2019-08-21 (×34): qty 10

## 2019-08-21 MED ORDER — MELATONIN 3 MG PO TABS: 3.0000 mg | ORAL_TABLET | Freq: Every day | ORAL | Status: DC

## 2019-08-21 NOTE — Progress Notes (Signed)
Physical Therapy Treatment Patient Details Name: George Kelly MRN: [id number] DOB: 06/15/1990 Today's Date: 08/21/2019    History of Present Illness 29 yo male found unresponsive with probable drug overdose.  PEA arrest with ROSC after about 15 minutes.  Found to have lactic acidosis, AKI from rhabdomyolysis, aspiration pneumonia, shock liver, hypothermia.  UDS positive for cocaine, benzo's, THC.   6/17 tracheostomy.    PT Comments    Pt was finally able to show episodes of less sedation albeit with some mild agitation.  Used this relative arousal level to work on sitting and standing balance.    Follow Up Recommendations  CIR;Supervision/Assistance - 24 hour     Equipment Recommendations  Other (comment)    Recommendations for Other Services       Precautions / Restrictions Precautions Precautions: Fall Precaution Comments: 4 pt restraints, condom cath,     Mobility  Bed Mobility Overal bed mobility: Needs Assistance Bed Mobility: Sit to Supine     Supine to sit: Max assist;Mod assist;+2 for safety/equipment     General bed mobility comments: still lower arousal initially and needed extra assist  Transfers Overall transfer level: Needs assistance   Transfers: Sit to/from Stand;Stand Pivot Transfers Sit to Stand: Max assist;+2 physical assistance Stand pivot transfers: Max assist;+2 physical assistance;+2 safety/equipment       General transfer comment: used face to face assist to attain full upright stand with pt standing fully upright at time and not able to fully stand with loss of focus.  Ambulation/Gait                 Stairs             Wheelchair Mobility    Modified Rankin (Stroke Patients Only)       Balance Overall balance assessment: Needs assistance Sitting-balance support: Feet supported;Bilateral upper extremity supported Sitting balance-Leahy Scale: Poor Sitting balance - Comments: At EOB working on balance, coordinating  trunk reactions..  Letting pt struggle to stay upright, but guarding fall away from midline until he regained some focus and attempted to return upright.   Standing balance support: Bilateral upper extremity supported Standing balance-Leahy Scale: Poor                              Cognition Arousal/Alertness: Lethargic Behavior During Therapy: Flat affect Overall Cognitive Status: Impaired/Different from baseline (NT formally)                                        Exercises      General Comments General comments (skin integrity, edema, etc.): pt maintaining sats in the mid to lower 90's on RA.during activity and sats started to drop when at rest so replaced TC on 28%      Pertinent Vitals/Pain Pain Assessment: Faces Faces Pain Scale: No hurt    Home Living                      Prior Function            PT Goals (current goals can now be found in the care plan section) Acute Rehab PT Goals PT Goal Formulation: With patient Time For Goal Achievement: 08/27/19 Potential to Achieve Goals: Fair    Frequency    Min 3X/week      PT Plan Current plan remains  appropriate;Frequency needs to be updated    Co-evaluation PT/OT/SLP Co-Evaluation/Treatment: Yes            AM-PAC PT "6 Clicks" Mobility   Outcome Measure  Help needed turning from your back to your side while in a flat bed without using bedrails?: Total Help needed moving from lying on your back to sitting on the side of a flat bed without using bedrails?: Total Help needed moving to and from a bed to a chair (including a wheelchair)?: Total Help needed standing up from a chair using your arms (e.g., wheelchair or bedside chair)?: Total Help needed to walk in hospital room?: Total Help needed climbing 3-5 steps with a railing? : Total 6 Click Score: 6    End of Session Equipment Utilized During Treatment: Oxygen Activity Tolerance: Patient tolerated treatment  well Patient left: in chair;with call bell/phone within reach;with chair alarm set;with restraints reapplied Nurse Communication: Mobility status PT Visit Diagnosis: Muscle weakness (generalized) (M62.81);Difficulty in walking, not elsewhere classified (R26.2);Other abnormalities of gait and mobility (R26.89)     Time: 1430-1506 PT Time Calculation (min) (ACUTE ONLY): 36 min  Charges:  $Therapeutic Activity: 8-22 mins $Neuromuscular Re-education: 8-22 mins                     08/21/2019  George Kelly., PT Acute Rehabilitation Services [phone]  (pager) [phone]  (office)   George Kelly 08/21/2019, 5:52 PM

## 2019-08-21 NOTE — Progress Notes (Addendum)
NAME:  George Kelly, MRN:  [id number], DOB:  06/15/1990, LOS: 27 ADMISSION DATE:  07/25/2019, CONSULTATION DATE:  07/25/2019 REFERRING MD:  Dr. Renaye Rakers, CHIEF COMPLAINT:  Cardiac arrest    Brief History   29 yo male found unresponsive with probable drug overdose.  PEA arrest with ROSC after about 15 minutes.  Found to have lactic acidosis, AKI from rhabdomyolysis, aspiration pneumonia, shock liver, hypothermia.  UDS positive for cocaine, benzo's, THC; experiencing frequent agitation with attempt to wean precedex   Past Medical History  Substance abuse   Significant Hospital Events   6/09 Admit, start TTM and LTM 6/10 CRRT 6/11 off CRRT, rewarming from TTM 6/12 A fib with RVR >> amiodarone 6/25 attempting to wean off IV meds-Precedex-still no happy medium between sedation and agitation 7/5: Precedex off, titration of medications for sedation, precedex restarted   Consults:  Nephrology - signed off 6/12  Procedures:  ETT 6/09 >> 6/21 Rt chest tube 6/09 >> out Lt radial a line 6/09 >> out  Rt [place] CVL 6/09 >> 6/23  Rt femoral HD cath 6/09 >> 6/12 LUE PICC 6/23 >> Trach 6/21 >>   Significant Diagnostic Tests:   CT head 6/09 >> no acute findings  CT chest 6/09 >> 10% Rt PTX, diffuse tree in bud and nodular opacities, debris within b/l mainstem bronchi, mucous plugging lower lobes  CT abd/pelvis 6/09 >> normal  Renal u/s 6/10 >> echogenic kidney b/l  Echo 6/10 >> EF 55 to 60%, grade 2 DD  CT Head 6/14 >> No acute intracranial abnormality.  CT Chest, abd, pelvis 6/14 >> Extensive bilateral airspace opacities, worsening since prior study.Dense consolidation in both lower lobes with small bilateral effusions. Right chest tube in place. Small residual right anterior pneumothorax. No acute findings in the abdomen or pelvis.  Micro Data:  COVID 6/9 > negative Blood culture 6/9 > lactobacillus Urine culture 6/9 > negative BCx2 6/14 >> negative Resp 6/15 >> enterobacter BC 6/21 >>  negative Tracheal aspirate 6/21 >> negative resp cx 7/3: MRSA and enterobacter aerogenes  Antimicrobials:  Zosyn 6/09 >> 6/14 Vancomycin 6/09 >> 6/10 Zyvox 6/11 linezolid 6/15 >> 6/16 merrem 6/15 >> 6/16 Cefepime 6/17 >> 6/23  Interim history/subjective:  7/4: pt restless this am on precedex, have been unable to wean. Still with copious secretions. Gram stain with gpc but fever curve improving as is wbc and pct low. Will cont to hold on abx for now.  7/6: tracheal aspirate positive for enterobacter and MRSA, no abx as patient respiratory status stable and afebrile for 48 hours   Objective   Blood pressure 122/89, pulse 80, temperature 98.4 F (36.9 C), temperature source Axillary, resp. rate 11, height 6\' 1"  (1.854 m), weight 76.8 kg, SpO2 95 %.    FiO2 (%):  [28 %] 28 %   Intake/Output Summary (Last 24 hours) at 08/21/2019 1104 Last data filed at 08/21/2019 1000 Gross per 24 hour  Intake 1956.26 ml  Output 1550 ml  Net 406.26 ml   Filed Weights   08/19/19 0423 08/20/19 0416 08/21/19 0500  Weight: 76.2 kg 76.4 kg 76.8 kg    Examination:  General - somnolent male, intermittently wakes to voice, in no apparent distress  Eyes - EOMI bilaterally no conjunctival injection or scleral icterus  ENT - tracheostomy collar in place, no rhinorrhea, MM appear moist, lips with some dry skin  Cardiac -RRR without murmur   Chest - no rhonchi, crackles or wheezing Abdomen - soft, nontender, bowel sounds  present throughout  Extremities - no LE edema  Skin - no rashes noted on exam   Neuro - somnolent, wakes to voice, previously observed responding to some commands, will open eyes when asked   Resolved/Stable Hospital Problem list   Hyperkalemia, Anion gap metabolic acidosis with lactic acidosis, Hypoglycemia, Septic shock, Reactive Afib/RVR, Thrombocytopenia from sepsis, AKI from ATN 2nd to sepsis, Elevated LFTs from hypotension, Gastritis, Hypernatremia, Aspiration pneumonia with  Enterobacter (completed ABx 6/23), Rt pneumothorax after CPR  Assessment & Plan:   Acute Hypoxic Respiratory Failure in setting of Cardiac Arrest and Aspiration Pneumonia - tracheostomy collar since 7/2  - trach care for tracheostomy collar  - PT recommending CIR   Nutrition - CorTrak with tube feeds    Fever  Tracheal aspirate positive for staph aureus  And enterobacter aerogenes that is resistant to cefazolin. Respiratory status is stable, afebrile for 48 hours with decreasing WBC - aspirate +MRSA, place on contact precautions  - monitor WBC count with serial CBC  - prn tylenol, ibuprofen  Acute metabolic encephalopathy 2nd to anoxia, sepsis 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 PRN Haldol not effectively immediately overnight so patient's precedex restarted.  - UDS positive for THC, cocaine, benzodiazepines  - discontinue precedex - discontinue fentanyl patch  100  - PRN Versed  - klonopin to 3mg  TID -continue clonidine, duragesic, oxycodone 40mg  q6 hrs (will plan to taper on 08/24/19),   seroquel 400mg  BID - discontinue zoloft  -continue propranolol  Hypertension BP ranged [phone]/57-110).  - continue catapres, labetalol - goal SBP < 150  Anemia of critical illness - hgb stable, 9.6on 7/6  - transfuse for Hb < 7 or significant bleeding  Deconditioning - recommended for CIR, CIR to evaluate pending patient tolerates being off of Precedex   Best practice:  Diet:  TF DVT prophylaxis: lovenox GI prophylaxis: protonix Mobility: As tolerated Code Status: Full Disposition: ICU until weaned from precedex Family Update: mother updated via telephone 7/6 Labs:   CMP Latest Ref Rng & Units 08/19/2019 08/18/2019 08/16/2019  Glucose 70 - 99 mg/dL 10/20/2019) 10/19/2019) 10/17/2019)  BUN 6 - 20 mg/dL 17 18 16   Creatinine 0.61 - 1.24 mg/dL 676(P 950(D 326(Z  Sodium 135 - 145 mmol/L 139 141 140  Potassium 3.5 - 5.1 mmol/L 4.2 4.3 3.9  Chloride 98 - 111 mmol/L 99 101 104  CO2 22 - 32 mmol/L  31 30 30   Calcium 8.9 - 10.3 mg/dL 9.2 9.2 )  Total Protein 6.5 - 8.1 g/dL - - -  Total Bilirubin 0.3 - 1.2 mg/dL - - -  Alkaline Phos 38 - 126 U/L - - -  AST 15 - 41 U/L - - -  ALT 0 - 44 U/L - - -    CBC Latest Ref Rng & Units 08/21/2019 08/20/2019 08/19/2019  WBC 4.0 - 10.5 K/uL 11.3(H) 11.6(H) 13.8(H)  Hemoglobin 13.0 - 17.0 g/dL ) 3.3(A) 10/22/2019)  Hematocrit 39 - 52 % 29.9(L) 30.9(L) 31.1(L)  Platelets 150 - 400 K/uL 311 403(H) 423(H)    ABG    Component Value Date/Time   PHART 7.438 08/11/2019 1130   PCO2ART 41.8 08/11/2019 1130   PO2ART 84 08/11/2019 1130   HCO3 28.1 (H) 08/11/2019 1130   TCO2 29 08/11/2019 1130   ACIDBASEDEF 6.0 (H) 07/26/2019 1121   O2SAT 96.0 08/11/2019 1130    CBG (last 3)  Recent Labs    08/20/19 2332 08/21/19 0309 08/21/19 0756  GLUCAP 104* 101* 91   Critical care  time:   Ronnald Ramp, MD  [facility] Family Medicine Resident, PGY-2  08/21/2019, 11:04 AM

## 2019-08-21 NOTE — Progress Notes (Signed)
  Speech Language Pathology Treatment: [name] Speaking valve  Patient Details Name: George Kelly MRN: [id number] DOB: 06/15/1990 Today's Date: 08/21/2019 Time: 9518-8416 SLP Time Calculation (min) (ACUTE ONLY): 10 min  Assessment / Plan / Recommendation Clinical Impression  Pt continues to require sedation for agitation. Today, he was arousable for brief moments.  Janina Mayo was changed this am to #6 cuffless.  PMV placed - despite lethargy, pt accessing upper airway with no air trapping.  He spontaneously voiced/groaned, stated "stop" on one occasion, but further speech could not be elicited. VS were stable throughout brief use of valve.  SLP will continue to follow.  Recommend continued placement of valve by RN to encourage cough, spontaneous voicing.  Will pursue swallow assessment as MS allows.    HPI HPI: 29 yo male found unresponsive with probable drug overdose.  PEA arrest with ROSC after about 15 minutes.  Found to have lactic acidosis, AKI from rhabdomyolysis, aspiration pneumonia, shock liver, hypothermia, acute metabolic encephalopathy 2nd to anoxia, sepsis.  UDS positive for cocaine, benzo's, THC.   ETT 6/09-6/21; 6/21 tracheostomy. Has been tolerating trach collar since 7/2; has cortrak.      SLP Plan  Continue with current plan of care       Recommendations   PMV use with supervision of RN, therapy staff                Plan: Continue with current plan of care       GO              George Kelly L. Samson Frederic, MA CCC/SLP Acute Rehabilitation Services Office number [phone] Pager [phone]   Blenda Mounts George Kelly 08/21/2019, 9:58 AM

## 2019-08-21 NOTE — Progress Notes (Signed)
PT trach change from 8 cuffed to 6 cuffless with no complications. Positive color change on ETCO2. Moderate amount of blood noted, RN aware. PT coughed up copious tan/pink tinged secretions.

## 2019-08-22 LAB — BASIC METABOLIC PANEL
Anion gap: 10 (ref 5–15)
BUN: 17 mg/dL (ref 6–20)
CO2: 32 mmol/L (ref 22–32)
Calcium: 9.1 mg/dL (ref 8.9–10.3)
Chloride: 96 mmol/L — ABNORMAL LOW (ref 98–111)
Creatinine, Ser: 0.82 mg/dL (ref 0.61–1.24)
GFR calc Af Amer: >60 mL/min (ref >60)
GFR calc non Af Amer: >60 mL/min (ref >60)
Glucose, Bld: 108 mg/dL — ABNORMAL HIGH (ref 70–99)
Potassium: 4.3 mmol/L (ref 3.5–5.1)
Sodium: 138 mmol/L (ref 135–145)

## 2019-08-22 LAB — CBC
HCT: 31.9 % — ABNORMAL LOW (ref 39.0–52.0)
Hemoglobin: 10.3 g/dL — ABNORMAL LOW (ref 13.0–17.0)
MCH: 28.3 pg (ref 26.0–34.0)
MCHC: 32.3 g/dL (ref 30.0–36.0)
MCV: 87.6 fL (ref 80.0–100.0)
Platelets: 394 10*3/uL (ref 150–400)
RBC: 3.64 MIL/uL — ABNORMAL LOW (ref 4.22–5.81)
RDW: 14.2 % (ref 11.5–15.5)
WBC: 12.3 10*3/uL — ABNORMAL HIGH (ref 4.0–10.5)
nRBC: 0 % (ref 0.0–0.2)

## 2019-08-22 LAB — GLUCOSE, CAPILLARY
Glucose-Capillary: 92 mg/dL (ref 70–99)
Glucose-Capillary: 125 mg/dL — ABNORMAL HIGH (ref 70–99)
Glucose-Capillary: 114 mg/dL — ABNORMAL HIGH (ref 70–99)
Glucose-Capillary: 89 mg/dL (ref 70–99)
Glucose-Capillary: 83 mg/dL (ref 70–99)
Glucose-Capillary: 118 mg/dL — ABNORMAL HIGH (ref 70–99)

## 2019-08-22 MED ORDER — CLONIDINE HCL 0.1 MG PO TABS: 0.1000 mg | ORAL_TABLET | Freq: Four times a day (QID) | ORAL | Status: DC

## 2019-08-22 MED ORDER — CLONIDINE HCL 0.1 MG PO TABS: 0.1000 mg | ORAL_TABLET | Freq: Two times a day (BID) | ORAL | Status: DC

## 2019-08-22 MED ORDER — CLONIDINE HCL 0.2 MG PO TABS
0.2000 mg | ORAL_TABLET | Freq: Four times a day (QID) | ORAL | Status: AC
  Administered 2019-08-24 – 2019-08-25 (×4): 0.2 mg
  Filled 2019-08-22 (×4): qty 1

## 2019-08-22 MED ORDER — MELATONIN 5 MG PO TABS
5.0000 mg | ORAL_TABLET | Freq: Every day | ORAL | Status: DC
  Administered 2019-08-22 – 2019-08-30 (×9): 5 mg
  Filled 2019-08-22 (×9): qty 1

## 2019-08-22 MED ORDER — CLONIDINE HCL 0.1 MG PO TABS: 0.1000 mg | ORAL_TABLET | Freq: Every day | ORAL | Status: DC

## 2019-08-22 MED ORDER — CLONIDINE HCL 0.3 MG PO TABS
0.3000 mg | ORAL_TABLET | Freq: Four times a day (QID) | ORAL | Status: AC
  Administered 2019-08-23 – 2019-08-24 (×4): 0.3 mg
  Filled 2019-08-22 (×4): qty 1

## 2019-08-22 MED ORDER — LORAZEPAM 2 MG/ML IJ SOLN
2.0000 mg | INTRAMUSCULAR | Status: DC | PRN
  Administered 2019-08-22 – 2019-08-28 (×14): 2 mg via INTRAVENOUS
  Filled 2019-08-22 (×15): qty 1

## 2019-08-22 MED ORDER — CLONIDINE HCL 0.2 MG PO TABS
0.4000 mg | ORAL_TABLET | Freq: Four times a day (QID) | ORAL | Status: AC
  Administered 2019-08-22 – 2019-08-23 (×3): 0.4 mg
  Filled 2019-08-22 (×3): qty 2

## 2019-08-22 MED ORDER — VITAL 1.5 CAL PO LIQD
1000.0000 mL | ORAL | Status: DC
  Administered 2019-08-22 – 2019-08-26 (×6): 1000 mL
  Filled 2019-08-22 (×17): qty 1000

## 2019-08-22 MED ORDER — CLONIDINE HCL 0.1 MG PO TABS
0.1000 mg | ORAL_TABLET | Freq: Four times a day (QID) | ORAL | Status: AC
  Administered 2019-08-25 – 2019-08-26 (×4): 0.1 mg
  Filled 2019-08-22 (×4): qty 1

## 2019-08-22 MED ORDER — CLONIDINE HCL 0.1 MG PO TABS
0.1000 mg | ORAL_TABLET | Freq: Every day | ORAL | Status: AC
  Administered 2019-08-27: 0.1 mg
  Filled 2019-08-22: qty 1

## 2019-08-22 MED ORDER — CLONIDINE HCL 0.1 MG PO TABS
0.1000 mg | ORAL_TABLET | Freq: Two times a day (BID) | ORAL | Status: AC
  Administered 2019-08-26 (×2): 0.1 mg
  Filled 2019-08-22 (×2): qty 1

## 2019-08-22 MED ORDER — CLONIDINE HCL 0.2 MG PO TABS
0.4000 mg | ORAL_TABLET | Freq: Four times a day (QID) | ORAL | Status: DC
  Administered 2019-08-22: 0.4 mg via ORAL
  Filled 2019-08-22: qty 2

## 2019-08-22 MED ORDER — CLONIDINE HCL 0.2 MG PO TABS: 0.2000 mg | ORAL_TABLET | Freq: Four times a day (QID) | ORAL | Status: DC

## 2019-08-22 MED ORDER — CLONIDINE HCL 0.3 MG PO TABS: 0.3000 mg | ORAL_TABLET | Freq: Four times a day (QID) | ORAL | Status: DC

## 2019-08-22 MED ORDER — PROSOURCE TF PO LIQD
45.0000 mL | Freq: Three times a day (TID) | ORAL | Status: DC
  Administered 2019-08-22 – 2019-08-31 (×26): 45 mL
  Filled 2019-08-22 (×29): qty 45

## 2019-08-22 NOTE — Progress Notes (Signed)
Nutrition Follow-up  DOCUMENTATION CODES:   Not applicable  INTERVENTION:   Tube Feeding via Cortrak:  Vital 1.5 at 65 ml/hr Pro-Source TF 45 mL TID Provides 138 g of protein, 2460 kcals, 1094 mL of free water Meets 100% estimated nutritional needs   NUTRITION DIAGNOSIS:   Inadequate oral intake related to acute illness as evidenced by NPO status.  Being addressed via TF   GOAL:   Patient will meet greater than or equal to 90% of their needs  Progressing  MONITOR:   Vent status, Labs, Weight trends, TF tolerance  REASON FOR ASSESSMENT:   Consult, Ventilator Enteral/tube feeding initiation and management  ASSESSMENT:   29 yo male found unresponsive with possible drug OD, admitted post cardiac arrest to ICU on vent with severe shock, AKI with hyperkalemia and acidosis. PMH includes polysubstance abuse.  6/09 CRRT initiated 6/10 CRRT discontinued 6/14 CT abdomen negative for acute process 6/17 Trach placement  6/18 Cortrak placed 7/02 Cortrak replaced  Tolerating Trach collar; SLP following for PMV, possible diet advancement once mentation improves  Vital 1.5 at 60 ml/hr, Pro-Stat 30 mL BID, Free water flush 200 mL q 6 hours via Cortrak tube  Current weight 75 kg  Labs: reviewed Meds: reviewed    Diet Order:   Diet Order            Diet NPO time specified  Diet effective now                 EDUCATION NEEDS:   Not appropriate for education at this time  Skin:  Skin Assessment: Skin Integrity Issues: Skin Integrity Issues:: Other (Comment) Other: MASD- groin  Last BM:  7/4 rectal tube  Height:   Ht Readings from Last 1 Encounters:  08/03/19 6\' 1"  (1.854 m)    Weight:   Wt Readings from Last 1 Encounters:  08/22/19 75 kg    BMI:  Body mass index is 21.81 kg/m.  Estimated Nutritional Needs:   Kcal:  2250-2625 kcals  Protein:  120-160 g  Fluid:  >/= 2 L   09-19-1986 MS, RDN, LDN, CNSC Registered Dietitian III Clinical  Nutrition RD Pager and On-Call Pager Number Located in [place]

## 2019-08-22 NOTE — Progress Notes (Signed)
NAME:  George Kelly, MRN:  [id number], DOB:  06/15/1990, LOS: 28 ADMISSION DATE:  07/25/2019, CONSULTATION DATE:  07/25/2019 REFERRING MD:  Dr. Renaye Rakers, CHIEF COMPLAINT:  Cardiac arrest    Brief History   29 yo male found unresponsive with probable drug overdose.  PEA arrest with ROSC after about 15 minutes.  Found to have lactic acidosis, AKI from rhabdomyolysis, aspiration pneumonia, shock liver, hypothermia.  UDS positive for cocaine, benzo's, THC. Weaned off of precedex, now adjusting medications to management frequent agitation.   Past Medical History  Substance abuse   Significant Hospital Events   6/09 Admit, start TTM and LTM 6/10 CRRT 6/11 off CRRT, rewarming from TTM 6/12 A fib with RVR >> amiodarone 6/25 attempting to wean off IV meds-Precedex-still no happy medium between sedation and agitation 7/5: Precedex off, titration of medications for sedation, precedex restarted 7/6: precedex discontinued, valproic acid started   Consults:  Nephrology - signed off 6/12  Procedures:  ETT 6/09 >> 6/21 Rt chest tube 6/09 >> out Lt radial a line 6/09 >> out  Rt [place] CVL 6/09 >> 6/23  Rt femoral HD cath 6/09 >> 6/12 LUE PICC 6/23 >> Trach 6/21 >>   Significant Diagnostic Tests:   CT head 6/09 >> no acute findings  CT chest 6/09 >> 10% Rt PTX, diffuse tree in bud and nodular opacities, debris within b/l mainstem bronchi, mucous plugging lower lobes  CT abd/pelvis 6/09 >> normal  Renal u/s 6/10 >> echogenic kidney b/l  Echo 6/10 >> EF 55 to 60%, grade 2 DD  CT Head 6/14 >> No acute intracranial abnormality.  CT Chest, abd, pelvis 6/14 >> Extensive bilateral airspace opacities, worsening since prior study.Dense consolidation in both lower lobes with small bilateral effusions. Right chest tube in place. Small residual right anterior pneumothorax. No acute findings in the abdomen or pelvis.  Micro Data:  COVID 6/9 > negative Blood culture 6/9 > lactobacillus Urine culture 6/9  > negative BCx2 6/14 >> negative Resp 6/15 >> enterobacter BC 6/21 >> negative Tracheal aspirate 6/21 >> negative resp cx 7/3: MRSA and enterobacter aerogenes  Antimicrobials:  Zosyn 6/09 >> 6/14 Vancomycin 6/09 >> 6/10 Zyvox 6/11 linezolid 6/15 >> 6/16 merrem 6/15 >> 6/16 Cefepime 6/17 >> 6/23  Interim history/subjective:  7/4: pt restless this am on precedex, have been unable to wean. Still with copious secretions. Gram stain with gpc but fever curve improving as is wbc and pct low. Will cont to hold on abx for now.  7/6: tracheal aspirate positive for enterobacter and MRSA, no abx as patient respiratory status stable and afebrile for 48 hours,  Contact precautions started   Objective   Blood pressure 125/77, pulse 75, temperature 98.4 F (36.9 C), temperature source Axillary, resp. rate 13, height 6\' 1"  (1.854 m), weight 75 kg, SpO2 96 %.    FiO2 (%):  [28 %] 28 %   Intake/Output Summary (Last 24 hours) at 08/22/2019 1011 Last data filed at 08/22/2019 0900 Gross per 24 hour  Intake 1790 ml  Output 2000 ml  Net -210 ml   Filed Weights   08/20/19 0416 08/21/19 0500 08/22/19 0500  Weight: 76.4 kg 76.8 kg 75 kg    Examination:  General - male appearing stated age, sleeping, intermittently awakens to voice  Eyes - normal appearing sclera and conjunctiva  ENT - tracehostomy collar in place  Cardiac -RRR Chest - breath sounds clear bilaterally without wheezing or crackles, no signs of respiratory distress, on tracheostomy collar  Abdomen - abdomen soft and nontender with bowel sounds present throg Extremities - bilateral LE edema  Skin - no rashes or skin changes noted on exam  Neuro - somnolent, wakes to voice, able to follow commands  Resolved/Stable Hospital Problem list   Hyperkalemia, Anion gap metabolic acidosis with lactic acidosis, Hypoglycemia, Septic shock, Reactive Afib/RVR, Thrombocytopenia from sepsis, AKI from ATN 2nd to sepsis, Elevated LFTs from hypotension,  Gastritis, Hypernatremia, Aspiration pneumonia with Enterobacter (completed ABx 6/23), Rt pneumothorax after CPR  Assessment & Plan:   Acute Hypoxic Respiratory Failure in setting of Cardiac Arrest and Aspiration Pneumonia - tracheostomy collar since 7/2  - tolerating PMV - RT and SLP following  - trach care for tracheostomy collar  - PT recommending CIR   Nutrition - CorTrak with tube feeds  - monitoring CBG, last 92   Fever  Respiratory status remains stable, remains afebrile. WBC 12.3 from 11.6  - aspirate +MRSA, continue contact precautions, no antimicrobial for now unless respiratory status worsens  - monitor WBC count with serial CBC  - prn tylenol, ibuprofen  Acute metabolic encephalopathy 2nd to anoxia, sepsis 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 - discontinue PRN Versed  - klonopin 3mg  TID - begin clonidine taper per pharmacy protocol    - oxycodone 40mg  q6 hrs, will monitor for 24 hours without fentanyl patch prior to starting oxycodone taper  - discontinue fentanyl patch  - seroquel 400mg  BID - continue propranolol  - discontinue four point restraints   Hypertension BP ranged 107-132/69-91  - continue clonidine - continue labetalol  - discontinue hydralazine  - goal SBP < 150  Anemia of critical illness - hgb stable,10.3 7/7 - transfuse for Hb < 7 or significant bleeding  Deconditioning - recommended for CIR  Best practice:  Diet:  TF DVT prophylaxis: lovenox GI prophylaxis: protonix Mobility: As tolerated Code Status: Full Disposition: ICU until weaned from precedex Family Update: patient's mother updated via telephone 08/22/19 Labs:   CMP Latest Ref Rng & Units 08/22/2019 08/19/2019 08/18/2019  Glucose 70 - 99 mg/dL 10/23/2019) 10/20/2019) 10/19/2019)  BUN 6 - 20 mg/dL 17 17 18   Creatinine 0.61 - 1.24 mg/dL 998(P 382(N 053(Z  Sodium 135 - 145 mmol/L 138 139 141  Potassium 3.5 - 5.1 mmol/L 4.3 4.2 4.3  Chloride 98 - 111 mmol/L 96(L) 99 101  CO2 22 - 32 mmol/L  32 31 30  Calcium 8.9 - 10.3 mg/dL 9.1 9.2 9.2  Total Protein 6.5 - 8.1 g/dL - - -  Total Bilirubin 0.3 - 1.2 mg/dL - - -  Alkaline Phos 38 - 126 U/L - - -  AST 15 - 41 U/L - - -  ALT 0 - 44 U/L - - -    CBC Latest Ref Rng & Units 08/22/2019 08/21/2019 08/20/2019  WBC 4.0 - 10.5 K/uL 12.3(H) 11.3(H) 11.6(H)  Hemoglobin 13.0 - 17.0 g/dL 10.3(L) 9.6(L) 9.8(L)  Hematocrit 39 - 52 % 31.9(L) 29.9(L) 30.9(L)  Platelets 150 - 400 K/uL 394 311 403(H)    ABG    Component Value Date/Time   PHART 7.438 08/11/2019 1130   PCO2ART 41.8 08/11/2019 1130   PO2ART 84 08/11/2019 1130   HCO3 28.1 (H) 08/11/2019 1130   TCO2 29 08/11/2019 1130   ACIDBASEDEF 6.0 (H) 07/26/2019 1121   O2SAT 96.0 08/11/2019 1130    CBG (last 3)  Recent Labs    08/21/19 2343 08/22/19 0336 08/22/19 0730  GLUCAP 96 92 125*   Critical care time: 08/13/2019  Ronnald Ramp, MD  [facility] Family Medicine Resident, PGY-2  08/22/2019, 10:11 AM

## 2019-08-23 LAB — CBC
HCT: 31.1 % — ABNORMAL LOW (ref 39.0–52.0)
Hemoglobin: 10 g/dL — ABNORMAL LOW (ref 13.0–17.0)
MCH: 28 pg (ref 26.0–34.0)
MCHC: 32.2 g/dL (ref 30.0–36.0)
MCV: 87.1 fL (ref 80.0–100.0)
Platelets: 387 10*3/uL (ref 150–400)
RBC: 3.57 MIL/uL — ABNORMAL LOW (ref 4.22–5.81)
RDW: 14 % (ref 11.5–15.5)
WBC: 11.6 10*3/uL — ABNORMAL HIGH (ref 4.0–10.5)
nRBC: 0 % (ref 0.0–0.2)

## 2019-08-23 LAB — GLUCOSE, CAPILLARY
Glucose-Capillary: 106 mg/dL — ABNORMAL HIGH (ref 70–99)
Glucose-Capillary: 110 mg/dL — ABNORMAL HIGH (ref 70–99)
Glucose-Capillary: 110 mg/dL — ABNORMAL HIGH (ref 70–99)
Glucose-Capillary: 114 mg/dL — ABNORMAL HIGH (ref 70–99)
Glucose-Capillary: 93 mg/dL (ref 70–99)
Glucose-Capillary: 97 mg/dL (ref 70–99)

## 2019-08-23 MED ORDER — QUETIAPINE FUMARATE 100 MG PO TABS
300.0000 mg | ORAL_TABLET | Freq: Two times a day (BID) | ORAL | Status: DC
  Administered 2019-08-23 – 2019-08-30 (×14): 300 mg
  Filled 2019-08-23 (×15): qty 3

## 2019-08-23 MED ORDER — OXYCODONE HCL 5 MG PO TABS
35.0000 mg | ORAL_TABLET | Freq: Three times a day (TID) | ORAL | Status: DC
  Administered 2019-08-23 – 2019-08-25 (×7): 35 mg
  Filled 2019-08-23 (×7): qty 7

## 2019-08-23 NOTE — Progress Notes (Signed)
NAME:  George Kelly, MRN:  [id number], DOB:  06/15/1990, LOS: 29 ADMISSION DATE:  07/25/2019, CONSULTATION DATE:  07/25/2019 REFERRING MD:  Dr. Renaye Rakers, CHIEF COMPLAINT:  Cardiac arrest    Brief History   29 yo male found unresponsive with probable drug overdose.  PEA arrest with ROSC after about 15 minutes.  Found to have lactic acidosis, AKI from rhabdomyolysis, aspiration pneumonia, shock liver, hypothermia.  UDS positive for cocaine, benzo's, THC. Weaned off of precedex, no longer in restraints and weaning medications prior to transfer from unit.    Past Medical History  Substance abuse   Significant Hospital Events   6/09 Admit, start TTM and LTM 6/10 CRRT 6/11 off CRRT, rewarming from TTM 6/12 A fib with RVR >> amiodarone 6/25 attempting to wean off IV meds-Precedex-still no happy medium between sedation and agitation 7/5: Precedex off, titration of medications for sedation, precedex restarted 7/6: precedex discontinued, valproic acid started   Consults:  Nephrology - signed off 6/12  Procedures:  ETT 6/09 >> 6/21 Rt chest tube 6/09 >> out Lt radial a line 6/09 >> out  Rt [place] CVL 6/09 >> 6/23  Rt femoral HD cath 6/09 >> 6/12 LUE PICC 6/23 >> Trach 6/21 >>   Significant Diagnostic Tests:   CT head 6/09 >> no acute findings  CT chest 6/09 >> 10% Rt PTX, diffuse tree in bud and nodular opacities, debris within b/l mainstem bronchi, mucous plugging lower lobes  CT abd/pelvis 6/09 >> normal  Renal u/s 6/10 >> echogenic kidney b/l  Echo 6/10 >> EF 55 to 60%, grade 2 DD  CT Head 6/14 >> No acute intracranial abnormality.  CT Chest, abd, pelvis 6/14 >> Extensive bilateral airspace opacities, worsening since prior study.Dense consolidation in both lower lobes with small bilateral effusions. Right chest tube in place. Small residual right anterior pneumothorax. No acute findings in the abdomen or pelvis.  Micro Data:  COVID 6/9 > negative Blood culture 6/9 >  lactobacillus Urine culture 6/9 > negative BCx2 6/14 >> negative Resp 6/15 >> enterobacter BC 6/21 >> negative Tracheal aspirate 6/21 >> negative resp cx 7/3: MRSA and enterobacter aerogenes  Antimicrobials:  Zosyn 6/09 >> 6/14 Vancomycin 6/09 >> 6/10 Zyvox 6/11 linezolid 6/15 >> 6/16 merrem 6/15 >> 6/16 Cefepime 6/17 >> 6/23  Interim history/subjective:  7/4: pt restless this am on precedex, have been unable to wean. Still with copious secretions. Gram stain with gpc but fever curve improving as is wbc and pct low. Will cont to hold on abx for now.  7/6: tracheal aspirate positive for enterobacter and MRSA, no abx as patient respiratory status stable and afebrile for 48 hours,  Contact precautions started   Objective   Blood pressure 122/70, pulse 81, temperature 98.6 F (37 C), temperature source Oral, resp. rate (!) 22, height 6\' 1"  (1.854 m), weight 76.4 kg, SpO2 97 %.    FiO2 (%):  [28 %] 28 %   Intake/Output Summary (Last 24 hours) at 08/23/2019 0836 Last data filed at 08/23/2019 0700 Gross per 24 hour  Intake 2445 ml  Output 1215 ml  Net 1230 ml   Filed Weights   08/21/19 0500 08/22/19 0500 08/23/19 0322  Weight: 76.8 kg 75 kg 76.4 kg    Examination:  General - male appearing stated age, sleeping in bed supine in NAD  Eyes - closed for most of exam, intermittently opens to command  ENT - trachesotomy Cardiac -RRR without murmur  Chest - no wheezing or crackles  Abdomen - soft, NT, +bowel sounds  Extremities - no LE edema, no longer in four point restraints  Skin - no new rashes or lesions noted  Neuro - somnolent   Resolved/Stable Hospital Problem list   Hyperkalemia, Anion gap metabolic acidosis with lactic acidosis, Hypoglycemia, Septic shock, Reactive Afib/RVR, Thrombocytopenia from sepsis, AKI from ATN 2nd to sepsis, Elevated LFTs from hypotension, Gastritis, Hypernatremia, Aspiration pneumonia with Enterobacter (completed ABx 6/23), Rt pneumothorax after  CPR  Assessment & Plan:   Acute Hypoxic Respiratory Failure in setting of Cardiac Arrest and Aspiration Pneumonia - tracheostomy collar since 7/2  - tolerating PMV - RT and SLP following  - trach care for tracheostomy collar  - PT recommending CIR   Nutrition - CorTrak with tube feeds  - monitoring CBG, last 92   Fever  Respiratory status remains stable, remains afebrile. WBC 11.6 from 12.3 - monitor WBC count with serial CBC  - prn tylenol, ibuprofen  Acute metabolic encephalopathy 2nd to anoxia, sepsis 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 - ativan 2mg  PRN  - klonopin 3mg  TID - Clonidine taper per pharmacy protocol    - decrease oxycodone to 35 mg TID  - decrease Seroquel to 300 mg BID - continue propranolol, recommend tapering as tolerated   Hypertension BP ranged 100-135/62-115.  - clonidine taper  - continue labetalol, consider change to amlodipine once out of ICU and wean off of labetalol   - goal SBP < 150  Anemia of critical illness - hgb stable, 10.3 - transfuse for Hb < 7 or significant bleeding  Deconditioning - recommended for CIR  Best practice:  Diet:  TF DVT prophylaxis: lovenox GI prophylaxis: protonix Mobility: As tolerated Code Status: Full Disposition: plan to transfer from ICU to progressive unit  Family Update: patient's mother updated via telephone 7/8  Labs:   CMP Latest Ref Rng & Units 08/22/2019 08/19/2019 08/18/2019  Glucose 70 - 99 mg/dL 10/20/2019) 10/19/2019) 338(S)  BUN 6 - 20 mg/dL 17 17 18   Creatinine 0.61 - 1.24 mg/dL 505(L 976(B  Sodium 135 - 145 mmol/L 138 139 141  Potassium 3.5 - 5.1 mmol/L 4.3 4.2 4.3  Chloride 98 - 111 mmol/L 96(L) 99 101  CO2 22 - 32 mmol/L 32 31 30  Calcium 8.9 - 10.3 mg/dL 9.1 9.2 9.2  Total Protein 6.5 - 8.1 g/dL - - -  Total Bilirubin 0.3 - 1.2 mg/dL - - -  Alkaline Phos 38 - 126 U/L - - -  AST 15 - 41 U/L - - -  ALT 0 - 44 U/L - - -    CBC Latest Ref Rng & Units 08/23/2019 08/22/2019 08/21/2019  WBC  4.0 - 10.5 K/uL 11.6(H) 12.3(H) 11.3(H)  Hemoglobin 13.0 - 17.0 g/dL 10.0(L) 10.3(L) 9.6(L)  Hematocrit 39 - 52 % 31.1(L) 31.9(L) 29.9(L)  Platelets 150 - 400 K/uL 387 394 311    ABG    Component Value Date/Time   PHART 7.438 08/11/2019 1130   PCO2ART 41.8 08/11/2019 1130   PO2ART 84 08/11/2019 1130   HCO3 28.1 (H) 08/11/2019 1130   TCO2 29 08/11/2019 1130   ACIDBASEDEF 6.0 (H) 07/26/2019 1121   O2SAT 96.0 08/11/2019 1130    CBG (last 3)  Recent Labs    08/22/19 1925 08/22/19 2322 08/23/19 0321  GLUCAP 83 118* 110*   Critical care time: 10/23/19   10/23/19, MD  [facility] Family Medicine Resident, PGY-2  08/23/2019, 8:36 AM

## 2019-08-23 NOTE — Progress Notes (Signed)
Occupational Therapy Treatment Patient Details Name: George Kelly MRN: [id number] DOB: 06/15/1990 Today's Date: 08/23/2019    History of present illness 29 yo male found unresponsive with probable drug overdose.  PEA arrest with ROSC after about 15 minutes.  Found to have lactic acidosis, AKI from rhabdomyolysis, aspiration pneumonia, shock liver, hypothermia.  UDS positive for cocaine, benzo's, THC.   6/17 tracheostomy.   OT comments  Pt received in bed, pt attempting to bring legs off edge of bed. Pt required maxA+2 to progress to sitting EOB. While sitting EOB continued to challenge pt's sitting balance and righting reactions. Pt required maxA+2 for sit<>stand x4 this session. He required assistance to extend knees and trunk to achieve full upright posture. Pt continues to require hand over hand assistance for coordinated movements with ADL. He keps his eyes open for about 50% of session. VSS on RA. Continue to recommend d/c to CIR and will continue to follow acutely.    Follow Up Recommendations  CIR (pending progress may need LTACH)    Equipment Recommendations  Other (comment) (TBD)    Recommendations for Other Services      Precautions / Restrictions Precautions Precautions: Fall Precaution Comments:  condom cath,  Restrictions Weight Bearing Restrictions: No       Mobility Bed Mobility Overal bed mobility: Needs Assistance Bed Mobility: Sit to Supine     Supine to sit: Max assist;Mod assist;+2 for safety/equipment     General bed mobility comments: pt initiating moving legs off EOB, (nonpurposefully) required maxA+2 to progress into upright sitting  Transfers Overall transfer level: Needs assistance Equipment used:  (2 person face to face transfer) Transfers: Sit to/from [employer] Sit to Stand: Max assist;+2 physical assistance Stand pivot transfers: Max assist;+2 physical assistance;+2 safety/equipment       General transfer comment: used  face to face assist to attain full upright stand with pt requiring assist to progress bilateral knees into extension and pelvis into extension to achieve upright posture    Balance Overall balance assessment: Needs assistance Sitting-balance support: Feet supported;Bilateral upper extremity supported Sitting balance-Leahy Scale: Poor Sitting balance - Comments: Sitting EOB, working on balance, coordinating trunk righting reactions.  Postural control: Posterior lean Standing balance support: Bilateral upper extremity supported Standing balance-Leahy Scale: Poor Standing balance comment: impulsively standing x4 this session;required maxA+2 to progress into full upright standing;required assistance to extend knees in standing;pt limited by cognition and poor coordination of movements                           ADL either performed or assessed with clinical judgement   ADL Overall ADL's : Needs assistance/impaired Eating/Feeding: NPO   Grooming: Total assistance Grooming Details (indicate cue type and reason): hand over hand/totalA to wipe mouth             Lower Body Dressing: Total assistance;Bed level Lower Body Dressing Details (indicate cue type and reason): donning socks Toilet Transfer: Maximal assistance;+2 for physical assistance;+2 for safety/equipment;Stand-pivot (simulated to recliner) Toilet Transfer Details (indicate cue type and reason): maxA+2 to achieve full upright posture RN present to assist with straightening knees         Functional mobility during ADLs: Maximal assistance;+2 for physical assistance (stand pivot) General ADL Comments: pt with decreased arousal and cognition;poor balance and activity tolerance     Vision       Perception     Praxis      Cognition Arousal/Alertness: Lethargic  Behavior During Therapy: Flat affect Overall Cognitive Status: Difficult to assess (NT formally)                                  General Comments: pt with eyes open 50%of session;not following commands         Exercises     Shoulder Instructions       General Comments VSS on RA SpO2 >90% throughout session;frequent coughing initially with secretions out of trach, once seated EOB minimal secretions    Pertinent Vitals/ Pain       Pain Assessment: Faces Faces Pain Scale: No hurt Pain Intervention(s): Monitored during session  Home Living                                          Prior Functioning/Environment              Frequency  Min 2X/week        Progress Toward Goals  OT Goals(current goals can now be found in the care plan section)  Progress towards OT goals: Progressing toward goals  Acute Rehab OT Goals Patient Stated Goal: none stated OT Goal Formulation: Patient unable to participate in goal setting Potential to Achieve Goals: Fair ADL Goals Pt Will Perform Grooming: with min assist;sitting Pt/caregiver will Perform Home Exercise Program: Increased strength;Increased ROM;Both right and left upper extremity;With minimal assist;With written HEP provided Additional ADL Goal #1: Pt will tolerate sitting EOB >7 min with minA as precursor to EOB/OOB ADL. Additional ADL Goal #2: Pt will follow 1 step simple command with 75% accuracy during ADL/functional task.  Plan Discharge plan remains appropriate    Co-evaluation    PT/OT/SLP Co-Evaluation/Treatment: Yes Reason for Co-Treatment: Complexity of the patient's impairments (multi-system involvement);For patient/therapist safety;To address functional/ADL transfers PT goals addressed during session: Mobility/safety with mobility OT goals addressed during session: ADL's and self-care      AM-PAC OT "6 Clicks" Daily Activity     Outcome Measure   Help from another person eating meals?: Total Help from another person taking care of personal grooming?: Total Help from another person toileting, which includes using  toliet, bedpan, or urinal?: Total Help from another person bathing (including washing, rinsing, drying)?: Total Help from another person to put on and taking off regular upper body clothing?: Total Help from another person to put on and taking off regular lower body clothing?: Total 6 Click Score: 6    End of Session    OT Visit Diagnosis: Other abnormalities of gait and mobility (R26.89);Muscle weakness (generalized) (M62.81);Other symptoms and signs involving cognitive function   Activity Tolerance Patient tolerated treatment well   Patient Left in chair;with call bell/phone within reach;with chair alarm set;Other (comment);with nursing/sitter in room (chair alarm belt)   Nurse Communication Mobility status        Time: 1540-0867 OT Time Calculation (min): 35 min  Charges: OT General Charges $OT Visit: 1 Visit OT Treatments $Self Care/Home Management : 8-22 mins  Rosey Bath OTR/L Acute Rehabilitation Services Office: [phone]    Rebeca Alert 08/23/2019, 12:59 PM

## 2019-08-23 NOTE — Progress Notes (Signed)
Physical Therapy Treatment Patient Details Name: George Kelly MRN: [id number] DOB: 06/15/1990 Today's Date: 08/23/2019    History of Present Illness 29 yo male found unresponsive with probable drug overdose.  PEA arrest with ROSC after about 15 minutes.  Found to have lactic acidosis, AKI from rhabdomyolysis, aspiration pneumonia, shock liver, hypothermia.  UDS positive for cocaine, benzo's, THC.   6/17 tracheostomy.    PT Comments    Pt incrementally less sedated today with a bit more response to therapy.  Emphasis on transitions, sitting balance, multiple sit to stands and transfer to the chair.   Follow Up Recommendations  CIR;Supervision/Assistance - 24 hour     Equipment Recommendations  Other (comment) (TBA)    Recommendations for Other Services       Precautions / Restrictions Precautions Precautions: Fall Precaution Comments:  condom cath,     Mobility  Bed Mobility Overal bed mobility: Needs Assistance Bed Mobility: Sit to Supine     Supine to sit: Max assist;Mod assist;+2 for safety/equipment     General bed mobility comments: pt initiating moving legs off EOB, (nonpurposefully) required maxA+2 to progress into upright sitting  Transfers Overall transfer level: Needs assistance Equipment used:  (2 person face to face transfer) Transfers: Sit to/from [employer] Sit to Stand: Max assist;+2 physical assistance Stand pivot transfers: Max assist;+2 physical assistance;+2 safety/equipment       General transfer comment: used face to face assist to attain full upright stand with pt requiring assist to progress bilateral knees into extension and pelvis into extension to achieve upright posture  Ambulation/Gait                 Stairs             Wheelchair Mobility    Modified Rankin (Stroke Patients Only)       Balance Overall balance assessment: Needs assistance Sitting-balance support: Feet supported;Bilateral upper  extremity supported Sitting balance-Leahy Scale: Poor Sitting balance - Comments: Sitting EOB, working on balance, coordinating trunk righting reactions.  Postural control: Posterior lean Standing balance support: Bilateral upper extremity supported Standing balance-Leahy Scale: Poor Standing balance comment: impulsively standing x4 this session;required maxA+2 to progress into full upright standing;required assistance to extend knees in standing;pt limited by cognition and poor coordination of movements                            Cognition Arousal/Alertness: Lethargic Behavior During Therapy: Flat affect Overall Cognitive Status: Difficult to assess (NT formally)                                 General Comments: pt with eyes open 50%of session;not following commands       Exercises      General Comments General comments (skin integrity, edema, etc.): VSS on RA SpO2 >90% throughout session;frequent coughing initially with secretions out of trach, once seated EOB minimal secretions      Pertinent Vitals/Pain Pain Assessment: Faces Faces Pain Scale: No hurt Pain Intervention(s): Monitored during session    Home Living                      Prior Function            PT Goals (current goals can now be found in the care plan section) Acute Rehab PT Goals Patient Stated Goal: none stated PT Goal  Formulation: With patient Time For Goal Achievement: 08/27/19 Potential to Achieve Goals: Fair Progress towards PT goals: Progressing toward goals (slow progress limited by sedation)    Frequency    Min 3X/week      PT Plan Current plan remains appropriate;Frequency needs to be updated    Co-evaluation PT/OT/SLP Co-Evaluation/Treatment: Yes Reason for Co-Treatment: Complexity of the patient's impairments (multi-system involvement) PT goals addressed during session: Mobility/safety with mobility OT goals addressed during session: ADL's and  self-care      AM-PAC PT "6 Clicks" Mobility   Outcome Measure  Help needed turning from your back to your side while in a flat bed without using bedrails?: Total Help needed moving from lying on your back to sitting on the side of a flat bed without using bedrails?: Total Help needed moving to and from a bed to a chair (including a wheelchair)?: Total Help needed standing up from a chair using your arms (e.g., wheelchair or bedside chair)?: Total Help needed to walk in hospital room?: Total Help needed climbing 3-5 steps with a railing? : Total 6 Click Score: 6    End of Session Equipment Utilized During Treatment: Oxygen Activity Tolerance: Patient tolerated treatment well Patient left: in chair;with call bell/phone within reach;with chair alarm set;with restraints reapplied Nurse Communication: Mobility status PT Visit Diagnosis: Muscle weakness (generalized) (M62.81);Difficulty in walking, not elsewhere classified (R26.2);Other abnormalities of gait and mobility (R26.89)     Time: 7544-9201 PT Time Calculation (min) (ACUTE ONLY): 33 min  Charges:  $Therapeutic Activity: 8-22 mins                     08/23/2019  George Halim., PT Acute Rehabilitation Services [phone]  (pager) [phone]  (office)   George Kelly 08/23/2019, 6:42 PM

## 2019-08-24 ENCOUNTER — Encounter (HOSPITAL_COMMUNITY): Payer: Self-pay | Admitting: Pulmonary Disease

## 2019-08-24 DIAGNOSIS — J9601 Acute respiratory failure with hypoxia: Secondary | ICD-10-CM | POA: Diagnosis present

## 2019-08-24 DIAGNOSIS — F1910 Other psychoactive substance abuse, uncomplicated: Secondary | ICD-10-CM

## 2019-08-24 DIAGNOSIS — N179 Acute kidney failure, unspecified: Secondary | ICD-10-CM

## 2019-08-24 DIAGNOSIS — Z930 Tracheostomy status: Secondary | ICD-10-CM

## 2019-08-24 DIAGNOSIS — I469 Cardiac arrest, cause unspecified: Secondary | ICD-10-CM | POA: Diagnosis present

## 2019-08-24 DIAGNOSIS — D62 Acute posthemorrhagic anemia: Secondary | ICD-10-CM

## 2019-08-24 DIAGNOSIS — D72829 Elevated white blood cell count, unspecified: Secondary | ICD-10-CM

## 2019-08-24 LAB — RENAL FUNCTION PANEL
Albumin: 2.3 g/dL — ABNORMAL LOW (ref 3.5–5.0)
Anion gap: 11 (ref 5–15)
BUN: 14 mg/dL (ref 6–20)
CO2: 30 mmol/L (ref 22–32)
Calcium: 9.1 mg/dL (ref 8.9–10.3)
Chloride: 98 mmol/L (ref 98–111)
Creatinine, Ser: 0.88 mg/dL (ref 0.61–1.24)
GFR calc Af Amer: >60 mL/min (ref >60)
GFR calc non Af Amer: >60 mL/min (ref >60)
Glucose, Bld: 118 mg/dL — ABNORMAL HIGH (ref 70–99)
Phosphorus: 5.2 mg/dL — ABNORMAL HIGH (ref 2.5–4.6)
Potassium: 4 mmol/L (ref 3.5–5.1)
Sodium: 139 mmol/L (ref 135–145)

## 2019-08-24 LAB — CBC
HCT: 33.1 % — ABNORMAL LOW (ref 39.0–52.0)
Hemoglobin: 10.6 g/dL — ABNORMAL LOW (ref 13.0–17.0)
MCH: 27.2 pg (ref 26.0–34.0)
MCHC: 32 g/dL (ref 30.0–36.0)
MCV: 85.1 fL (ref 80.0–100.0)
Platelets: 366 10*3/uL (ref 150–400)
RBC: 3.89 MIL/uL — ABNORMAL LOW (ref 4.22–5.81)
RDW: 14.1 % (ref 11.5–15.5)
WBC: 10.7 10*3/uL — ABNORMAL HIGH (ref 4.0–10.5)
nRBC: 0 % (ref 0.0–0.2)

## 2019-08-24 LAB — GLUCOSE, CAPILLARY
Glucose-Capillary: 102 mg/dL — ABNORMAL HIGH (ref 70–99)
Glucose-Capillary: 107 mg/dL — ABNORMAL HIGH (ref 70–99)
Glucose-Capillary: 109 mg/dL — ABNORMAL HIGH (ref 70–99)
Glucose-Capillary: 114 mg/dL — ABNORMAL HIGH (ref 70–99)
Glucose-Capillary: 117 mg/dL — ABNORMAL HIGH (ref 70–99)
Glucose-Capillary: 89 mg/dL (ref 70–99)

## 2019-08-24 MED ORDER — ORAL CARE MOUTH RINSE
15.0000 mL | Freq: Two times a day (BID) | OROMUCOSAL | Status: DC
  Administered 2019-08-24 – 2019-09-11 (×30): 15 mL via OROMUCOSAL

## 2019-08-24 MED ORDER — CHLORHEXIDINE GLUCONATE 0.12 % MT SOLN
15.0000 mL | Freq: Two times a day (BID) | OROMUCOSAL | Status: DC
  Administered 2019-08-24 – 2019-09-13 (×33): 15 mL via OROMUCOSAL
  Filled 2019-08-24 (×35): qty 15

## 2019-08-24 NOTE — Progress Notes (Signed)
PROGRESS NOTE    George Kelly  MOQ:[id number] DOB: 06/15/1990 DOA: 07/25/2019 PCP: Patient, No Pcp Per   Brief Narrative:   29 yo male found unresponsive with probable drug overdose.  PEA arrest with ROSC after about 15 minutes.  Found to have lactic acidosis, AKI from rhabdomyolysis, aspiration pneumonia, shock liver, hypothermia.  UDS positive for cocaine, benzo's, THC. Weaned off of precedex, no longer in restraints and weaning medications prior to transfer from unit.    7/9: Sedate this morning. Would stir to noxious stimuli but not following my commands. Nursing reports that he had just received his morning meds and this is his pattern. He was more active later in the day.    Assessment & Plan:   Active Problems:   Cardiac arrest (HCC)   Hyperkalemia   Acute respiratory failure with hypoxia (HCC)   Acute renal failure with tubular necrosis (HCC)   PEA (Pulseless electrical activity) (HCC)   Acute hypoxemic respiratory failure (HCC)   Acute renal failure (ARF) (HCC)   Status post tracheostomy (HCC)   Tracheostomy tube present (HCC)   Polysubstance abuse (HCC)   Leukocytosis   Acute blood loss anemia  Acute Hypoxic Respiratory Failure Cardiac Arrest Aspiration Pneumonia/Enterobacter PNA     - tracheostomy collar since 7/2; continue trach care      - tolerating PMV     - RT and SLP following      - wean as able  Nutrition     - CorTrak with tube feeds   Fever     - afebrile last 24 hrs; WBC trending down, follow     - prn tylenol, ibuprofen  Acute metabolic encephalopathy 2nd to anoxia, sepsis (secondary to Enterobacter PNA) 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     - ativan 2mg  PRN      - klonopin 3mg  TID     - Clonidine taper per pharmacy protocol        - decrease oxycodone to 35 mg TID      - decrease Seroquel to 300 mg BID     - continue propranolol, recommend tapering as tolerated     - 7/9: has remained off precedex for 72 hours; continue  clonidine taper; tomorrow begin weaning oxycodone 5mg  every 4 days as per PCCM recommendation from 08/22/19 note   Hypertension     - BP ranged 100-135/62-115.      - clonidine taper      - continue labetalol, consider change to amlodipine once out of ICU and wean off of labetalol       - goal SBP < 150     - 7/9: BP acceptable; continue current regimen until out of ICU  Anemia of critical illness     - Hgb is 10.6 this AM, transfuse for <7.   Deconditioning     - recommended for CIR; see their note  DVT prophylaxis: lovenox Code Status: FULL Family Communication: None at bedside   Status is: Inpatient  Remains inpatient appropriate because:Inpatient level of care appropriate due to severity of illness   Dispo: The patient is from: Home              Anticipated d/c is to: CIR              Anticipated d/c date is: > 3 days              Patient currently is not medically stable to d/c.  Consultants:   PCCM  Nephrology  Procedures:   ETT  R Chest Tube  Trach  R Femerol HD cath  R [place] CVL  L radial a line  ROS:  Unable to obtain d/t mentation  Subjective: Moments of agitation ON, but improved overall per nursing. Off precedex 72hrs now.   Objective: Vitals:   08/24/19 1400 08/24/19 1500 08/24/19 1504 08/24/19 1539  BP: 130/83 121/71  127/79  Pulse: 86 83 83 83  Resp: 15 11 12    Temp:    99.3 F (37.4 C)  TempSrc:    Axillary  SpO2: 92% 97% 97%   Weight:      Height:        Intake/Output Summary (Last 24 hours) at 08/24/2019 1611 Last data filed at 08/24/2019 1540 Gross per 24 hour  Intake 2695 ml  Output 785 ml  Net 1910 ml   Filed Weights   08/22/19 0500 08/23/19 0322 08/24/19 0343  Weight: 75 kg 76.4 kg 75.8 kg    Examination:  General: 29 y.o. male resting in bed in NAD Cardiovascular: RRR, +S1, S2, no m/g/r Respiratory: CTABL, no w/r/r, trach'd GI: BS+, NDNT, soft, coretrak in place MSK: No e/c/c Neuro: somnolent, stirs to noxious  stim  Data Reviewed: I have personally reviewed following labs and imaging studies.  CBC: Recent Labs  Lab 08/18/19 0337 08/19/19 0421 08/20/19 0422 08/21/19 0604 08/22/19 0534 08/23/19 0622 08/24/19 0405  WBC 15.6*   < > 11.6* 11.3* 12.3* 11.6* 10.7*  NEUTROABS 11.3*  --   --   --   --   --   --   HGB 9.4*   < > 9.8* 9.6* 10.3* 10.0* 10.6*  HCT 29.1*   < > 30.9* 29.9* 31.9* 31.1* 33.1*  MCV 87.1   < > 87.3 86.9 87.6 87.1 85.1  PLT 419*   < > 403* 311 394 387 366   < > = values in this interval not displayed.   Basic Metabolic Panel: Recent Labs  Lab 08/18/19 0337 08/19/19 0421 08/22/19 0534 08/24/19 1406  NA 141 139 138 139  K 4.3 4.2 4.3 4.0  CL 101 99 96* 98  CO2 30 31 32 30  GLUCOSE 112* 119* 108* 118*  BUN 18 17 17 14   CREATININE 1.09 0.97 0.82 0.88  CALCIUM 9.2 9.2 9.1 9.1  MG  --  1.7  --   --   PHOS  --   --   --  5.2*   GFR: Estimated Creatinine Clearance: 134 mL/min (by C-G formula based on SCr of 0.88 mg/dL). Liver Function Tests: Recent Labs  Lab 08/24/19 1406  ALBUMIN 2.3*   No results for input(s): LIPASE, AMYLASE in the last 168 hours. No results for input(s): AMMONIA in the last 168 hours. Coagulation Profile: No results for input(s): INR, PROTIME in the last 168 hours. Cardiac Enzymes: No results for input(s): CKTOTAL, CKMB, CKMBINDEX, TROPONINI in the last 168 hours. BNP (last 3 results) No results for input(s): PROBNP in the last 8760 hours. HbA1C: No results for input(s): HGBA1C in the last 72 hours. CBG: Recent Labs  Lab 08/23/19 2340 08/24/19 0335 08/24/19 0747 08/24/19 1242 08/24/19 1519  GLUCAP 97 109* 107* 114* 89   Lipid Profile: No results for input(s): CHOL, HDL, LDLCALC, TRIG, CHOLHDL, LDLDIRECT in the last 72 hours. Thyroid Function Tests: No results for input(s): TSH, T4TOTAL, FREET4, T3FREE, THYROIDAB in the last 72 hours. Anemia Panel: No results for input(s): VITAMINB12, FOLATE, FERRITIN, TIBC, IRON, RETICCTPCT  in the  last 72 hours. Sepsis Labs: Recent Labs  Lab 08/18/19 1011 08/19/19 0421 08/20/19 0422  PROCALCITON 0.33 0.33 0.27    Recent Results (from the past 240 hour(s))  Culture, respiratory     Status: None   Collection Time: 08/18/19  4:11 PM   Specimen: Tracheal Aspirate; Respiratory  Result Value Ref Range Status   Specimen Description TRACHEAL ASPIRATE  Final   Special Requests NONE  Final   Gram Stain   Final    ABUNDANT WBC PRESENT, PREDOMINANTLY PMN RARE SQUAMOUS EPITHELIAL CELLS PRESENT MODERATE GRAM POSITIVE COCCI IN PAIRS Performed at [facility], [address] [place], [place] [address]    Culture   Final    MODERATE METHICILLIN RESISTANT STAPHYLOCOCCUS AUREUS MODERATE ENTEROBACTER AEROGENES    Report Status 08/21/2019 FINAL  Final   Organism ID, Bacteria ENTEROBACTER AEROGENES  Final   Organism ID, Bacteria METHICILLIN RESISTANT STAPHYLOCOCCUS AUREUS  Final      Susceptibility   Enterobacter aerogenes - MIC*    CEFAZOLIN RESISTANT Resistant     CEFEPIME <=0.12 SENSITIVE Sensitive     CEFTAZIDIME <=1 SENSITIVE Sensitive     CEFTRIAXONE <=0.25 SENSITIVE Sensitive     CIPROFLOXACIN <=0.25 SENSITIVE Sensitive     GENTAMICIN <=1 SENSITIVE Sensitive     IMIPENEM <=0.25 SENSITIVE Sensitive     TRIMETH/SULFA <=20 SENSITIVE Sensitive     PIP/TAZO <=4 SENSITIVE Sensitive     * MODERATE ENTEROBACTER AEROGENES   Methicillin resistant staphylococcus aureus - MIC*    CIPROFLOXACIN >=8 RESISTANT Resistant     ERYTHROMYCIN >=8 RESISTANT Resistant     GENTAMICIN <=0.5 SENSITIVE Sensitive     OXACILLIN >=4 RESISTANT Resistant     TETRACYCLINE <=1 SENSITIVE Sensitive     VANCOMYCIN <=0.5 SENSITIVE Sensitive     TRIMETH/SULFA 160 RESISTANT Resistant     CLINDAMYCIN <=0.25 SENSITIVE Sensitive     RIFAMPIN <=0.5 SENSITIVE Sensitive     Inducible Clindamycin NEGATIVE Sensitive     * MODERATE METHICILLIN RESISTANT STAPHYLOCOCCUS AUREUS      Radiology Studies: No  results found.   Scheduled Meds: . chlorhexidine  15 mL Mouth Rinse BID  . Chlorhexidine Gluconate Cloth  6 each Topical Daily  . clonazePAM  3 mg Per Tube TID  . cloNIDine  0.2 mg Per Tube Q6H   Followed by  . [START ON 08/25/2019] cloNIDine  0.1 mg Per Tube Q6H   Followed by  . [START ON 08/26/2019] cloNIDine  0.1 mg Per Tube BID   Followed by  . [START ON 08/27/2019] cloNIDine  0.1 mg Per Tube Daily  . docusate  100 mg Per Tube BID  . enoxaparin (LOVENOX) injection  40 mg Subcutaneous Q24H  . feeding supplement (PROSource TF)  45 mL Per Tube TID  . free water  200 mL Per Tube Q6H  . labetalol  100 mg Per Tube BID  . mouth rinse  15 mL Mouth Rinse q12n4p  . melatonin  5 mg Per Tube Daily  . oxyCODONE  35 mg Per Tube Q8H  . pantoprazole sodium  40 mg Per Tube Q24H  . polyethylene glycol  17 g Per Tube Daily  . propranolol  40 mg Per Tube TID  . QUEtiapine  300 mg Per Tube BID  . sodium chloride flush  10-40 mL Intracatheter Q12H  . valproic acid  500 mg Per Tube TID   Continuous Infusions: . sodium chloride Stopped (08/21/19 0548)  . feeding supplement (VITAL 1.5 CAL) 1,000  mL (08/24/19 1351)     LOS: 30 days    Time spent: 25 minutes spent in the coordination of care today.    Teddy Spike, DO [facility]  If 7PM-7AM, please contact night-coverage [email or web address] 08/24/2019, 4:11 PM

## 2019-08-24 NOTE — Progress Notes (Signed)
Inpatient Rehab Admissions Coordinator:   At this time, we are recommending CIR consult.  I will place an order per our protocol.  Please contact me with questions.    Estill Dooms, PT, DPT Admissions Coordinator [phone] 08/24/19  9:17 AM

## 2019-08-24 NOTE — Consult Note (Signed)
Physical Medicine and Rehabilitation Consult   Reason for Consult: TBI Referring Physician: Dr. Marylyn Ishihara   HPI: George Kelly is a 29 y.o. male who was admitted on 07/25/2019 after being found unresponsive at a house where he had been partying and drinking.  History taken from chart review, nursing.  Discussed with attending MD.  He had pinpoint pupils with agonal breathing therefore received narcan and subsequently went into PEA arrest with CPR and ACLS protocol X 5 minutes. Approximate downtime felt to be 15 minutes. EMS unable to place king airway so nasal trumpet placed, did have emesis enroute and was unresponsive at admission. UDS positive for benzos, cocaine and THC.   He was intubated in ED and found to have right PTX treated with chest tube as well as diffuse tree in bud/nodular opacities c/w aspiration.  Hospital course further complicated by AKI with hyperkalemia and mixed respiratory/metabolic acidosis. Nephrology consulted and recommended aggressive fluid resuscitation with bicarb as well as work up for rhabdomyolysis. He was started on broad spectrum antibiotics and pressors for sepsis due to aspiration, treated with cooling protocol.  Long term EEG ordered and showed revealing severe diffuse encephalopathy without seizures.  Renal ultrasound showed bilateral echogenic kidneys seen with medical renal disease as well as GB wall thickening. CT head personally reviewed, unremarkable for acute intracranial process.  Echocardiogram with ejection fraction of 55 to 60% with grade II diastolic dysfunction with moderate concentric hypertrophy. AKI felt to be due to ischemic ATH with pigment nephropathy superimposed rhabdomyolysis and patient required CRRT briefly due to ongoing hyperkalemia. As AKI resolved nephrology signed off 06/12. He has had issues with shocked liver, copious secretions requiring tracheostomy on 06/17 by Dr. Valeta Harms, bouts of agitation requiring sedation as well as intermittent  fevers. He was extubated to ATC 07/2, downsized to CFS #6 by 08/21/19 and PMSV trials started.  Has been treated with multiple antibiotics and MRSA /enterobacter in BAL felt to be contaminant-being observed off antibiotics since 06/23.  Leukocytosis improving and patient has been afebrile  Has been weaned off precedex and valproic acid started 07/06 to help manage agitation. Therapy ongoing and patient showing improvement in mentation and activity.  CIR recommended due to functional deficits.    Review of Systems  Unable to perform ROS: Mental acuity   Past Medical History:  Diagnosis Date  . Cardiac arrest ([place])   . Polysubstance abuse [facility])     Surgical History:  Unable to elicit   Family History:  Unable to elicit  Social History:  Unable to elicit.    Allergies  Allergen Reactions  . Peanut-Containing Drug Products     No medications prior to admission.    Home: Home Living Family/patient expects to be discharged to:: Unsure  Functional History: Prior Function Comments: no information other than was partying. Functional Status:  Mobility: Bed Mobility Overal bed mobility: Needs Assistance Bed Mobility: Sit to Supine Rolling: Total assist Supine to sit: Max assist, Mod assist, +2 for safety/equipment Sit to supine: Total assist, +2 for physical assistance General bed mobility comments: pt initiating moving legs off EOB, (nonpurposefully) required maxA+2 to progress into upright sitting Transfers Overall transfer level: Needs assistance Equipment used:  (2 person face to face transfer) Transfers: Sit to/from Stand, Stand Pivot Transfers Sit to Stand: Max assist, +2 physical assistance Stand pivot transfers: Max assist, +2 physical assistance, +2 safety/equipment General transfer comment: used face to face assist to attain full upright stand with pt requiring assist to  progress bilateral knees into extension and pelvis into extension to achieve upright posture       ADL: ADL Overall ADL's : Needs assistance/impaired Eating/Feeding: NPO Grooming: Total assistance Grooming Details (indicate cue type and reason): hand over hand/totalA to wipe mouth Lower Body Bathing: Sit to/from stand, Total assistance, +2 for physical assistance Lower Body Bathing Details (indicate cue type and reason): Hand over hand to assist pt in applying lotion to BLEs.  Lower Body Dressing: Total assistance, Bed level Lower Body Dressing Details (indicate cue type and reason): donning socks Toilet Transfer: Maximal assistance, +2 for physical assistance, +2 for safety/equipment, Stand-pivot (simulated to recliner) Toilet Transfer Details (indicate cue type and reason): maxA+2 to achieve full upright posture RN present to assist with straightening knees Functional mobility during ADLs: Maximal assistance, +2 for physical assistance (stand pivot) General ADL Comments: pt with decreased arousal and cognition;poor balance and activity tolerance  Cognition: Cognition Overall Cognitive Status: Difficult to assess (NT formally) Orientation Level: Intubated/Tracheostomy - Unable to assess Cognition Arousal/Alertness: Lethargic Behavior During Therapy: Flat affect Overall Cognitive Status: Difficult to assess (NT formally) General Comments: pt with eyes open 50%of session;not following commands  Difficult to assess due to: Level of arousal, Tracheostomy   Blood pressure 121/82, pulse 80, temperature 98.7 F (37.1 C), temperature source Axillary, resp. rate 13, height _0  (1.854 m), weight 75.8 kg, SpO2 97 %. Physical Exam Vitals and nursing note reviewed.  Constitutional:      General: He is not in acute distress.    Appearance: He is not ill-appearing.     Comments: Keeps eyes closed.   Neck flexed to the right. made no attempts to open eyes or interact despite max verbal/tacile attempts.  Bilateral mittens in place.  + NG.  HENT:     Head: Normocephalic and atraumatic.      Right Ear: External ear normal.     Left Ear: External ear normal.     Nose: Nose normal.  Eyes:     General:        Right eye: No discharge.        Left eye: No discharge.     Comments: Keep eye closed  Neck:     Comments: +Trach cuffless trach in  place. Moderate amount of thin grey secretions noted with occasional cough.  Flexed to right Cardiovascular:     Rate and Rhythm: Normal rate and regular rhythm.  Pulmonary:     Effort: Pulmonary effort is normal. No respiratory distress.     Comments: Upper airway sounds Abdominal:     General: Abdomen is flat. Bowel sounds are normal.     Palpations: Abdomen is soft.  Musculoskeletal:     Comments: No edema or tenderness in extremities  Neurological:     Comments: Somnolent Restless movements of BLE and withdraws BUE to pain. Motor: Limited due to ability to follow commands and somnolence  Psychiatric:     Comments: Unable to assess due to mentation     Results for orders placed or performed during the hospital encounter of 07/25/19 (from the past 24 hour(s))  Glucose, capillary     Status: Abnormal   Collection Time: 08/23/19 12:34 PM  Result Value Ref Range   Glucose-Capillary 110 (H) 70 - 99 mg/dL  Glucose, capillary     Status: Abnormal   Collection Time: 08/23/19  3:36 PM  Result Value Ref Range   Glucose-Capillary 114 (H) 70 - 99 mg/dL  Glucose, capillary  Status: None   Collection Time: 08/23/19  7:28 PM  Result Value Ref Range   Glucose-Capillary 93 70 - 99 mg/dL  Glucose, capillary     Status: None   Collection Time: 08/23/19 11:40 PM  Result Value Ref Range   Glucose-Capillary 97 70 - 99 mg/dL  Glucose, capillary     Status: Abnormal   Collection Time: 08/24/19  3:35 AM  Result Value Ref Range   Glucose-Capillary 109 (H) 70 - 99 mg/dL  CBC     Status: Abnormal   Collection Time: 08/24/19  4:05 AM  Result Value Ref Range   WBC 10.7 (H) 4.0 - 10.5 K/uL   RBC 3.89 (L) 4.22 - 5.81 MIL/uL   Hemoglobin  10.6 (L) 13.0 - 17.0 g/dL   HCT 33.1 (L) 39 - 52 %   MCV 85.1 80.0 - 100.0 fL   MCH 27.2 26.0 - 34.0 pg   MCHC 32.0 30.0 - 36.0 g/dL   RDW 14.1 11.5 - 15.5 %   Platelets 366 150 - 400 K/uL   nRBC 0.0 0.0 - 0.2 %  Glucose, capillary     Status: Abnormal   Collection Time: 08/24/19  7:47 AM  Result Value Ref Range   Glucose-Capillary 107 (H) 70 - 99 mg/dL   No results found.  Assessment/Plan: Diagnosis: PEA arrest with ?hypoxic BI Labs and images (see above) independently reviewed.  Records reviewed and summated above.  1. Does the need for close, 24 hr/day medical supervision in concert with the patient's rehab needs make it unreasonable for this patient to be served in a less intensive setting? Yes 2. Co-Morbidities requiring supervision/potential complications: Polysubstance abuse (counsel when appropriate), leukocytosis (repeat labs, cont to monitor for signs and symptoms of infection, further workup if indicated), ABLA (repeat labs, consider transfusion if necessary to ensure appropriate perfusion for increased activity tolerance) 3. Due to bladder management, bowel management, safety, skin/wound care, disease management, medication administration, pain management and patient education, does the patient require 24 hr/day rehab nursing? Yes 4. Does the patient require coordinated care of a physician, rehab nurse, therapy disciplines of PT/OT/SLP to address physical and functional deficits in the context of the above medical diagnosis(es)? Yes Addressing deficits in the following areas: balance, endurance, locomotion, strength, transferring, bowel/bladder control, bathing, dressing, toileting, cognition, speech, language, swallowing and psychosocial support 5. Can the patient actively participate in an intensive therapy program of at least 3 hrs of therapy per day at least 5 days per week? Potentially 6. The potential for patient to make measurable gains while on inpatient rehab is  excellent 7. Anticipated functional outcomes upon discharge from inpatient rehab are supervision  with PT, supervision with OT, supervision with SLP. 8. Estimated rehab length of stay to reach the above functional goals is: 10-14 days. 9. Anticipated discharge destination: Home 10. Overall Rehab/Functional Prognosis: good  RECOMMENDATIONS: This patient's condition is appropriate for continued rehabilitative care in the following setting: We will need to reevaluate patient once medications have been weaned and patient more alert and able to participate in therapies.  If patient has is not at/has not progressed to supervision level of functioning, recommend CIR if caregiver support available upon discharge. Will also need to evaluate for hypoxic BI Patient has agreed to participate in recommended program. Potentially Note that insurance prior authorization may be required for reimbursement for recommended care.  Comment: Rehab Admissions Coordinator to follow up.  I have personally performed a face to face diagnostic evaluation, including, but not  limited to relevant history and physical exam findings, of this patient and developed relevant assessment and plan.  Additionally, I have reviewed and concur with the physician assistant's documentation above.   Delice Lesch, MD, ABPMR Bary Leriche, PA-C 08/24/2019

## 2019-08-24 NOTE — Progress Notes (Signed)
  Speech Language Pathology Treatment: [name] Speaking valve  Patient Details Name: George Kelly MRN: [id number] DOB: 06/15/1990 Today's Date: 08/24/2019 Time: 6195-0932 SLP Time Calculation (min) (ACUTE ONLY): 9 min  Assessment / Plan / Recommendation Clinical Impression  RN reports pt's scheduled meds are slowly being weaned and received meds soon before session. Attempted to open eyes to initially. Painful stimuli when head turned/stretched to left and spontaneous "stop it" after speaking valve donned. He could not move secretions from larynx but cleared throat (not on command) noting wet vocal quality. Respirations non labored and donning and doffing were unremarkable and vitals stable. Despite SLP interaction, allowing time to respond with tactile prompts- was unable to further engage. PMV with full supervision with staff, remove when leaving room.    HPI HPI: 29 yo male found unresponsive with probable drug overdose.  PEA arrest with ROSC after about 15 minutes.  Found to have lactic acidosis, AKI from rhabdomyolysis, aspiration pneumonia, shock liver, hypothermia, acute metabolic encephalopathy 2nd to anoxia, sepsis.  UDS positive for cocaine, benzo's, THC.   ETT 6/09-6/21; 6/21 tracheostomy. Has been tolerating trach collar since 7/2; has cortrak.      SLP Plan  Continue with current plan of care       Recommendations         Patient may use Passy-Muir Speech Valve: Intermittently with supervision PMSV Supervision: Full MD: Please consider changing trach tube to : Smaller size         Oral Care Recommendations: Oral care QID Follow up Recommendations: 24 hour supervision/assistance SLP Visit Diagnosis: Aphonia (R49.1) Plan: Continue with current plan of care       GO                [name] 08/24/2019, 12:16 PM  [name] [name].Ed Nurse, children's [phone] Office [phone]

## 2019-08-25 LAB — GLUCOSE, CAPILLARY
Glucose-Capillary: 105 mg/dL — ABNORMAL HIGH (ref 70–99)
Glucose-Capillary: 108 mg/dL — ABNORMAL HIGH (ref 70–99)
Glucose-Capillary: 84 mg/dL (ref 70–99)
Glucose-Capillary: 84 mg/dL (ref 70–99)
Glucose-Capillary: 102 mg/dL — ABNORMAL HIGH (ref 70–99)

## 2019-08-25 LAB — CBC WITH DIFFERENTIAL/PLATELET
Abs Immature Granulocytes: 0.06 10*3/uL (ref 0.00–0.07)
Basophils Absolute: 0 10*3/uL (ref 0.0–0.1)
Basophils Relative: 0 %
Eosinophils Absolute: 0.9 10*3/uL — ABNORMAL HIGH (ref 0.0–0.5)
Eosinophils Relative: 8 %
HCT: 33.1 % — ABNORMAL LOW (ref 39.0–52.0)
Hemoglobin: 10.7 g/dL — ABNORMAL LOW (ref 13.0–17.0)
Immature Granulocytes: 1 %
Lymphocytes Relative: 22 %
Lymphs Abs: 2.5 10*3/uL (ref 0.7–4.0)
MCH: 27.8 pg (ref 26.0–34.0)
MCHC: 32.3 g/dL (ref 30.0–36.0)
MCV: 86 fL (ref 80.0–100.0)
Monocytes Absolute: 0.8 10*3/uL (ref 0.1–1.0)
Monocytes Relative: 7 %
Neutro Abs: 7 10*3/uL (ref 1.7–7.7)
Neutrophils Relative %: 62 %
Platelets: 375 10*3/uL (ref 150–400)
RBC: 3.85 MIL/uL — ABNORMAL LOW (ref 4.22–5.81)
RDW: 14.3 % (ref 11.5–15.5)
WBC: 11.2 10*3/uL — ABNORMAL HIGH (ref 4.0–10.5)
nRBC: 0 % (ref 0.0–0.2)

## 2019-08-25 LAB — RENAL FUNCTION PANEL
Albumin: 2.3 g/dL — ABNORMAL LOW (ref 3.5–5.0)
Anion gap: 12 (ref 5–15)
BUN: 15 mg/dL (ref 6–20)
CO2: 28 mmol/L (ref 22–32)
Calcium: 9.4 mg/dL (ref 8.9–10.3)
Chloride: 99 mmol/L (ref 98–111)
Creatinine, Ser: 0.87 mg/dL (ref 0.61–1.24)
GFR calc Af Amer: >60 mL/min (ref >60)
GFR calc non Af Amer: >60 mL/min (ref >60)
Glucose, Bld: 111 mg/dL — ABNORMAL HIGH (ref 70–99)
Phosphorus: 6.4 mg/dL — ABNORMAL HIGH (ref 2.5–4.6)
Potassium: 4.4 mmol/L (ref 3.5–5.1)
Sodium: 139 mmol/L (ref 135–145)

## 2019-08-25 LAB — MAGNESIUM: Magnesium: 1.7 mg/dL (ref 1.7–2.4)

## 2019-08-25 MED ORDER — HALOPERIDOL LACTATE 5 MG/ML IJ SOLN
5.0000 mg | Freq: Once | INTRAMUSCULAR | Status: AC | PRN
  Administered 2019-08-25: 5 mg via INTRAVENOUS
  Filled 2019-08-25: qty 1

## 2019-08-25 MED ORDER — OXYCODONE HCL 5 MG PO TABS
30.0000 mg | ORAL_TABLET | Freq: Three times a day (TID) | ORAL | Status: DC
  Administered 2019-08-25 – 2019-08-29 (×11): 30 mg
  Filled 2019-08-25 (×11): qty 6

## 2019-08-25 NOTE — Progress Notes (Signed)
[facility]  PROGRESS NOTE    George Kelly  WSF:[id number] DOB: 06/15/1990 DOA: 07/25/2019 PCP: Patient, No Pcp Per   Brief Narrative:   29 yo male found unresponsive with probable drug overdose. PEA arrest with ROSC after about 15 minutes. Found to have lactic acidosis, AKI from rhabdomyolysis, aspiration pneumonia, shock liver, hypothermia. UDS positive for cocaine, benzo's, THC. Weaned off of precedex, no longer in restraints and weaning medications prior to transfer from unit.  7/10: Apparently agitated last night and needed restraints. We are likely to be in this cyclical pattern for some time. He can be transferred to PCU.    Assessment & Plan:   Active Problems:   Cardiac arrest (HCC)   Hyperkalemia   Acute respiratory failure with hypoxia (HCC)   Acute renal failure with tubular necrosis (HCC)   PEA (Pulseless electrical activity) (HCC)   Acute hypoxemic respiratory failure (HCC)   Acute renal failure (ARF) (HCC)   Status post tracheostomy (HCC)   Tracheostomy tube present (HCC)   Polysubstance abuse (HCC)   Leukocytosis   Acute blood loss anemia  Acute Hypoxic Respiratory Failure Cardiac Arrest Aspiration Pneumonia/Enterobacter PNA     - tracheostomy collar since 7/2; continue trach care      - tolerating PMV     - RT and SLP following      - downsize/wean trach per SLP/Pulm schedule  Nutrition     - CorTrak with tube feeds   Fever     - prn tylenol, ibuprofen     - WBC stable, no fever ON, follow  Acute metabolic encephalopathy 2nd to anoxia, sepsis (secondary to Enterobacter PNA) 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     - ativan 2mg  PRN      - klonopin 3mg  TID     - Clonidine taper per pharmacy protocol        - decrease oxycodone to 35 mg TID      - decrease Seroquel to 300 mg BID     - continue propranolol, recommend tapering as tolerated     - 7/9: has remained off precedex for 72 hours; continue clonidine taper; tomorrow begin weaning oxycodone  5mg  every 4 days as per PCCM recommendation from 08/22/19 note      - 7/10: wean oxycodone to 30mg  TID per PCCM recommendation from 08/22/19 note; next wean 7/14  Hypertension     - BP ranged 100-135/62-115.      - clonidine taper      - continue labetalol, consider change to amlodipine once out of ICU and wean off of labetalol       - goal SBP < 150     - BP is ok; continue to follow  Anemia of critical illness     - Hgb is 10.7 this AM, transfuse for <7.   Deconditioning     - recommended for CIR; see their note  DVT prophylaxis: lovenox Code Status: FULL Family Communication: None at bedside   Status is: Inpatient  Remains inpatient appropriate because:Inpatient level of care appropriate due to severity of illness   Dispo: The patient is from: Home              Anticipated d/c is to: CIR              Anticipated d/c date is: > 3 days              Patient currently is not medically stable to d/c.  Consultants:  PCCM  Nephrology  Procedures:   ETT  Trach  R chest tube  R femoral HD cath  R Emmett CVL  L radial A-line    ROS:  Unable to obtain d/t mentation  Subjective: Agitated last night. Needed restratints.   Objective: Vitals:   08/25/19 1200 08/25/19 1300 08/25/19 1400 08/25/19 1500  BP: (!) 121/110 116/76 92/79 104/64  Pulse: 95 87 88 84  Resp: 19 20 20 10   Temp: 99 F (37.2 C)     TempSrc: Axillary     SpO2: 96% 95% 96% 95%  Weight:      Height:        Intake/Output Summary (Last 24 hours) at 08/25/2019 1522 Last data filed at 08/25/2019 1500 Gross per 24 hour  Intake 2275 ml  Output 1850 ml  Net 425 ml   Filed Weights   08/23/19 0322 08/24/19 0343 08/25/19 0228  Weight: 76.4 kg 75.8 kg 74.4 kg    Examination:  General: 29 y.o. male resting in bed in NAD, trach'd on blowby Cardiovascular: RRR, +S1, S2, no m/g/r, equal pulses throughout Respiratory: UAT, no w/r/r, normal WOB on O2 blowby, trach'd GI: BS+, NDNT, coretrak MSK: No  e/c/c Neuro: somnolent, stirs to noxious stim   Data Reviewed: I have personally reviewed following labs and imaging studies.  CBC: Recent Labs  Lab 08/21/19 0604 08/22/19 0534 08/23/19 0622 08/24/19 0405 08/25/19 0304  WBC 11.3* 12.3* 11.6* 10.7* 11.2*  NEUTROABS  --   --   --   --  7.0  HGB 9.6* 10.3* 10.0* 10.6* 10.7*  HCT 29.9* 31.9* 31.1* 33.1* 33.1*  MCV 86.9 87.6 87.1 85.1 86.0  PLT 311 394 387 366 375   Basic Metabolic Panel: Recent Labs  Lab 08/19/19 0421 08/22/19 0534 08/24/19 1406 08/25/19 0304  NA 139 138 139 139  K 4.2 4.3 4.0 4.4  CL 99 96* 98 99  CO2 31 32 30 28  GLUCOSE 119* 108* 118* 111*  BUN 17 17 14 15   CREATININE 0.97 0.82 0.88 0.87  CALCIUM 9.2 9.1 9.1 9.4  MG 1.7  --   --  1.7  PHOS  --   --  5.2* 6.4*   GFR: Estimated Creatinine Clearance: 133 mL/min (by C-G formula based on SCr of 0.87 mg/dL). Liver Function Tests: Recent Labs  Lab 08/24/19 1406 08/25/19 0304  ALBUMIN 2.3* 2.3*   No results for input(s): LIPASE, AMYLASE in the last 168 hours. No results for input(s): AMMONIA in the last 168 hours. Coagulation Profile: No results for input(s): INR, PROTIME in the last 168 hours. Cardiac Enzymes: No results for input(s): CKTOTAL, CKMB, CKMBINDEX, TROPONINI in the last 168 hours. BNP (last 3 results) No results for input(s): PROBNP in the last 8760 hours. HbA1C: No results for input(s): HGBA1C in the last 72 hours. CBG: Recent Labs  Lab 08/24/19 1956 08/24/19 2333 08/25/19 0312 08/25/19 0806 08/25/19 1246  GLUCAP 102* 117* 105* 108* 84   Lipid Profile: No results for input(s): CHOL, HDL, LDLCALC, TRIG, CHOLHDL, LDLDIRECT in the last 72 hours. Thyroid Function Tests: No results for input(s): TSH, T4TOTAL, FREET4, T3FREE, THYROIDAB in the last 72 hours. Anemia Panel: No results for input(s): VITAMINB12, FOLATE, FERRITIN, TIBC, IRON, RETICCTPCT in the last 72 hours. Sepsis Labs: Recent Labs  Lab 08/19/19 0421  08/20/19 0422  PROCALCITON 0.33 0.27    Recent Results (from the past 240 hour(s))  Culture, respiratory     Status: None   Collection Time: 08/18/19  4:11 PM   Specimen: Tracheal Aspirate; Respiratory  Result Value Ref Range Status   Specimen Description TRACHEAL ASPIRATE  Final   Special Requests NONE  Final   Gram Stain   Final    ABUNDANT WBC PRESENT, PREDOMINANTLY PMN RARE SQUAMOUS EPITHELIAL CELLS PRESENT MODERATE GRAM POSITIVE COCCI IN PAIRS Performed at [facility], [address], [place], [place] [address]    Culture   Final    MODERATE METHICILLIN RESISTANT STAPHYLOCOCCUS AUREUS MODERATE ENTEROBACTER AEROGENES    Report Status 08/21/2019 FINAL  Final   Organism ID, Bacteria ENTEROBACTER AEROGENES  Final   Organism ID, Bacteria METHICILLIN RESISTANT STAPHYLOCOCCUS AUREUS  Final      Susceptibility   Enterobacter aerogenes - MIC*    CEFAZOLIN RESISTANT Resistant     CEFEPIME <=0.12 SENSITIVE Sensitive     CEFTAZIDIME <=1 SENSITIVE Sensitive     CEFTRIAXONE <=0.25 SENSITIVE Sensitive     CIPROFLOXACIN <=0.25 SENSITIVE Sensitive     GENTAMICIN <=1 SENSITIVE Sensitive     IMIPENEM <=0.25 SENSITIVE Sensitive     TRIMETH/SULFA <=20 SENSITIVE Sensitive     PIP/TAZO <=4 SENSITIVE Sensitive     * MODERATE ENTEROBACTER AEROGENES   Methicillin resistant staphylococcus aureus - MIC*    CIPROFLOXACIN >=8 RESISTANT Resistant     ERYTHROMYCIN >=8 RESISTANT Resistant     GENTAMICIN <=0.5 SENSITIVE Sensitive     OXACILLIN >=4 RESISTANT Resistant     TETRACYCLINE <=1 SENSITIVE Sensitive     VANCOMYCIN <=0.5 SENSITIVE Sensitive     TRIMETH/SULFA 160 RESISTANT Resistant     CLINDAMYCIN <=0.25 SENSITIVE Sensitive     RIFAMPIN <=0.5 SENSITIVE Sensitive     Inducible Clindamycin NEGATIVE Sensitive     * MODERATE METHICILLIN RESISTANT STAPHYLOCOCCUS AUREUS      Radiology Studies: No results found.   Scheduled Meds: . chlorhexidine  15 mL Mouth Rinse BID  .  Chlorhexidine Gluconate Cloth  6 each Topical Daily  . clonazePAM  3 mg Per Tube TID  . cloNIDine  0.1 mg Per Tube Q6H   Followed by  . [START ON 08/26/2019] cloNIDine  0.1 mg Per Tube BID   Followed by  . [START ON 08/27/2019] cloNIDine  0.1 mg Per Tube Daily  . docusate  100 mg Per Tube BID  . enoxaparin (LOVENOX) injection  40 mg Subcutaneous Q24H  . feeding supplement (PROSource TF)  45 mL Per Tube TID  . free water  200 mL Per Tube Q6H  . labetalol  100 mg Per Tube BID  . mouth rinse  15 mL Mouth Rinse q12n4p  . melatonin  5 mg Per Tube Daily  . oxyCODONE  35 mg Per Tube Q8H  . pantoprazole sodium  40 mg Per Tube Q24H  . polyethylene glycol  17 g Per Tube Daily  . propranolol  40 mg Per Tube TID  . QUEtiapine  300 mg Per Tube BID  . sodium chloride flush  10-40 mL Intracatheter Q12H  . valproic acid  500 mg Per Tube TID   Continuous Infusions: . sodium chloride Stopped (08/21/19 0548)  . feeding supplement (VITAL 1.5 CAL) 65 mL/hr at 08/25/19 1500     LOS: 31 days    Time spent: 25 minutes spent in the coordination of care today.    Teddy Spike, DO [facility]  If 7PM-7AM, please contact night-coverage [email or web address] 08/25/2019, 3:22 PM

## 2019-08-25 NOTE — Progress Notes (Signed)
Inpatient Rehab Admissions:  Inpatient Rehab Consult received.  I attempted to  meet with pt at the bedside for rehabilitation assessment and to discuss goals and expectations of an inpatient rehab admission. Pt is currently in four-point restraints and was not alert d/t medication.  Spoke with pt's nurse who noted that pt does not follow commands and pt only says "stop it" when PMV placed.  Will continue to follow pt's progress with acute therapies and medical workup.  Signed: Wolfgang Phoenix, MS, CCC-SLP Admissions Coordinator [phone]

## 2019-08-26 LAB — COMPREHENSIVE METABOLIC PANEL
ALT: 108 U/L — ABNORMAL HIGH (ref 0–44)
AST: 51 U/L — ABNORMAL HIGH (ref 15–41)
Albumin: 2.3 g/dL — ABNORMAL LOW (ref 3.5–5.0)
Alkaline Phosphatase: 214 U/L — ABNORMAL HIGH (ref 38–126)
Anion gap: 15 (ref 5–15)
BUN: 13 mg/dL (ref 6–20)
CO2: 24 mmol/L (ref 22–32)
Calcium: 9.5 mg/dL (ref 8.9–10.3)
Chloride: 98 mmol/L (ref 98–111)
Creatinine, Ser: 0.79 mg/dL (ref 0.61–1.24)
GFR calc Af Amer: >60 mL/min (ref >60)
GFR calc non Af Amer: >60 mL/min (ref >60)
Glucose, Bld: 120 mg/dL — ABNORMAL HIGH (ref 70–99)
Potassium: 4.1 mmol/L (ref 3.5–5.1)
Sodium: 137 mmol/L (ref 135–145)
Total Bilirubin: 0.3 mg/dL (ref 0.3–1.2)
Total Protein: 8.3 g/dL — ABNORMAL HIGH (ref 6.5–8.1)

## 2019-08-26 LAB — CBC WITH DIFFERENTIAL/PLATELET
Abs Immature Granulocytes: 0.05 10*3/uL (ref 0.00–0.07)
Basophils Absolute: 0 10*3/uL (ref 0.0–0.1)
Basophils Relative: 0 %
Eosinophils Absolute: 1.1 10*3/uL — ABNORMAL HIGH (ref 0.0–0.5)
Eosinophils Relative: 10 %
HCT: 32.6 % — ABNORMAL LOW (ref 39.0–52.0)
Hemoglobin: 10.6 g/dL — ABNORMAL LOW (ref 13.0–17.0)
Immature Granulocytes: 1 %
Lymphocytes Relative: 20 %
Lymphs Abs: 2.2 10*3/uL (ref 0.7–4.0)
MCH: 27.5 pg (ref 26.0–34.0)
MCHC: 32.5 g/dL (ref 30.0–36.0)
MCV: 84.7 fL (ref 80.0–100.0)
Monocytes Absolute: 0.8 10*3/uL (ref 0.1–1.0)
Monocytes Relative: 7 %
Neutro Abs: 6.9 10*3/uL (ref 1.7–7.7)
Neutrophils Relative %: 62 %
Platelets: 313 10*3/uL (ref 150–400)
RBC: 3.85 MIL/uL — ABNORMAL LOW (ref 4.22–5.81)
RDW: 14.2 % (ref 11.5–15.5)
WBC: 11 10*3/uL — ABNORMAL HIGH (ref 4.0–10.5)
nRBC: 0 % (ref 0.0–0.2)

## 2019-08-26 LAB — GLUCOSE, CAPILLARY
Glucose-Capillary: 91 mg/dL (ref 70–99)
Glucose-Capillary: 98 mg/dL (ref 70–99)
Glucose-Capillary: 101 mg/dL — ABNORMAL HIGH (ref 70–99)
Glucose-Capillary: 89 mg/dL (ref 70–99)
Glucose-Capillary: 100 mg/dL — ABNORMAL HIGH (ref 70–99)

## 2019-08-26 LAB — MAGNESIUM: Magnesium: 1.6 mg/dL — ABNORMAL LOW (ref 1.7–2.4)

## 2019-08-26 MED ORDER — AMLODIPINE BESYLATE 5 MG PO TABS
2.5000 mg | ORAL_TABLET | Freq: Every day | ORAL | Status: DC
  Administered 2019-08-26: 2.5 mg via ORAL
  Filled 2019-08-26 (×2): qty 1

## 2019-08-26 MED ORDER — MAGNESIUM SULFATE 2 GM/50ML IV SOLN
2.0000 g | Freq: Once | INTRAVENOUS | Status: AC
  Administered 2019-08-26: 2 g via INTRAVENOUS
  Filled 2019-08-26: qty 50

## 2019-08-26 NOTE — Progress Notes (Signed)
PROGRESS NOTE    George Kelly  IOE:[id number] DOB: 06/15/1990 DOA: 07/25/2019 PCP: Patient, No Pcp Per   Brief Narrative:   30 yo male found unresponsive with probable drug overdose. PEA arrest with ROSC after about 15 minutes. Found to have lactic acidosis, AKI from rhabdomyolysis, aspiration pneumonia, shock liver, hypothermia. UDS positive for cocaine, benzo's, THC. Weaned off of precedex, no longer in restraints and weaning medications prior to transfer from unit.  7/11: More active this AM. Following some commands. Continue to wean meds and support as able. Ok for transfer to PCU   Assessment & Plan:   Active Problems:   Cardiac arrest (HCC)   Hyperkalemia   Acute respiratory failure with hypoxia (HCC)   Acute renal failure with tubular necrosis (HCC)   PEA (Pulseless electrical activity) (HCC)   Acute hypoxemic respiratory failure (HCC)   Acute renal failure (ARF) (HCC)   Status post tracheostomy (HCC)   Tracheostomy tube present (HCC)   Polysubstance abuse (HCC)   Leukocytosis   Acute blood loss anemia  Acute Hypoxic Respiratory Failure Cardiac Arrest Aspiration Pneumonia/Enterobacter PNA     - tracheostomy collar since 7/2; continue trach care      - tolerating PMV     - RT and SLP following      - wean as able     - 7/11: continue current care  Nutrition     - CorTrak with tube feeds   Fever     - afebrile last 72 hrs; WBC stable     - prn tylenol, ibuprofen  Acute metabolic encephalopathy 2nd to anoxia, sepsis (secondary to Enterobacter PNA) 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     - ativan 2mg  PRN      - klonopin 3mg  TID     - Clonidine taper per pharmacy protocol        - decrease oxycodone to 35 mg TID      - decrease Seroquel to 300 mg BID     - continue propranolol, recommend tapering as tolerated     - 7/11: has remained off precedex; continue clonidine taper; oxycodone at 30mg  TID, wean oxycodone 5mg  every 4 days as per PCCM  recommendation from 08/22/19 note (next wean 7/14)   Hypertension     - clonidine taper      - continue labetalol, consider change to amlodipine once out of ICU and wean off of labetalol       - goal SBP < 150     - 7/11: add low dose amlodipine and taper labetalol  Anemia of critical illness     - Hgb is 10.6 this AM, transfuse for <7.   Deconditioning     - recommended for CIR; see their note  Hypomagnesemia     - replace, monitor  DVT prophylaxis: lovenox Code Status: FULL Family Communication: Spoke with mother by phone.    Status is: Inpatient  Remains inpatient appropriate because:Inpatient level of care appropriate due to severity of illness   Dispo: The patient is from: Home              Anticipated d/c is to: CIR              Anticipated d/c date is: > 3 days              Patient currently is not medically stable to d/c.  Consultants:   PCCM  Neprhology  Procedures:   ETT  R Chest Tube  Simmie Davies Femerol HD cath  R [place] CVL  L radial a line  ROS:  Unable to obtain d/t mentation  Subjective: More active this AM per nursing.  Objective: Vitals:   08/26/19 0400 08/26/19 0427 08/26/19 0500 08/26/19 0600  BP: 125/85  129/87 114/62  Pulse: 89  91 85  Resp: 15  18 10   Temp:      TempSrc:      SpO2: 96% 98% 96% 96%  Weight:      Height:        Intake/Output Summary (Last 24 hours) at 08/26/2019 0750 Last data filed at 08/26/2019 0600 Gross per 24 hour  Intake 2375 ml  Output 1585 ml  Net 790 ml   Filed Weights   08/24/19 0343 08/25/19 0228 08/26/19 0344  Weight: 75.8 kg 74.4 kg 75.3 kg    Examination:  General: 29 y.o. male resting in bed in NAD, trach'd Cardiovascular: RRR, +S1, S2, no m/g/r Respiratory: upper airway transmission, no w/r/r, normal WOB on trach GI: BS+, NDNT, soft, coretrak MSK: No e/c/c Neuro: more active this AM, but confused  Data Reviewed: I have personally reviewed following labs and imaging  studies.  CBC: Recent Labs  Lab 08/22/19 0534 08/23/19 0622 08/24/19 0405 08/25/19 0304 08/26/19 0359  WBC 12.3* 11.6* 10.7* 11.2* 11.0*  NEUTROABS  --   --   --  7.0 6.9  HGB 10.3* 10.0* 10.6* 10.7* 10.6*  HCT 31.9* 31.1* 33.1* 33.1* 32.6*  MCV 87.6 87.1 85.1 86.0 84.7  PLT 394 387 366 375 313   Basic Metabolic Panel: Recent Labs  Lab 08/22/19 0534 08/24/19 1406 08/25/19 0304 08/26/19 0359  NA 138 139 139 137  K 4.3 4.0 4.4 4.1  CL 96* 98 99 98  CO2 32 30 28 24   GLUCOSE 108* 118* 111* 120*  BUN 17 14 15 13   CREATININE 0.82 0.88 0.87 0.79  CALCIUM 9.1 9.1 9.4 9.5  MG  --   --  1.7 1.6*  PHOS  --  5.2* 6.4*  --    GFR: Estimated Creatinine Clearance: 146.4 mL/min (by C-G formula based on SCr of 0.79 mg/dL). Liver Function Tests: Recent Labs  Lab 08/24/19 1406 08/25/19 0304 08/26/19 0359  AST  --   --  51*  ALT  --   --  108*  ALKPHOS  --   --  214*  BILITOT  --   --  0.3  PROT  --   --  8.3*  ALBUMIN 2.3* 2.3* 2.3*   No results for input(s): LIPASE, AMYLASE in the last 168 hours. No results for input(s): AMMONIA in the last 168 hours. Coagulation Profile: No results for input(s): INR, PROTIME in the last 168 hours. Cardiac Enzymes: No results for input(s): CKTOTAL, CKMB, CKMBINDEX, TROPONINI in the last 168 hours. BNP (last 3 results) No results for input(s): PROBNP in the last 8760 hours. HbA1C: No results for input(s): HGBA1C in the last 72 hours. CBG: Recent Labs  Lab 08/25/19 0806 08/25/19 1246 08/25/19 1918 08/25/19 2307 08/26/19 0340  GLUCAP 108* 84 84 102* 91   Lipid Profile: No results for input(s): CHOL, HDL, LDLCALC, TRIG, CHOLHDL, LDLDIRECT in the last 72 hours. Thyroid Function Tests: No results for input(s): TSH, T4TOTAL, FREET4, T3FREE, THYROIDAB in the last 72 hours. Anemia Panel: No results for input(s): VITAMINB12, FOLATE, FERRITIN, TIBC, IRON, RETICCTPCT in the last 72 hours. Sepsis Labs: Recent Labs  Lab 08/20/19 0422   PROCALCITON 0.27    Recent  Results (from the past 240 hour(s))  Culture, respiratory     Status: None   Collection Time: 08/18/19  4:11 PM   Specimen: Tracheal Aspirate; Respiratory  Result Value Ref Range Status   Specimen Description TRACHEAL ASPIRATE  Final   Special Requests NONE  Final   Gram Stain   Final    ABUNDANT WBC PRESENT, PREDOMINANTLY PMN RARE SQUAMOUS EPITHELIAL CELLS PRESENT MODERATE GRAM POSITIVE COCCI IN PAIRS Performed at [facility], [address], [place], [place] [address]    Culture   Final    MODERATE METHICILLIN RESISTANT STAPHYLOCOCCUS AUREUS MODERATE ENTEROBACTER AEROGENES    Report Status 08/21/2019 FINAL  Final   Organism ID, Bacteria ENTEROBACTER AEROGENES  Final   Organism ID, Bacteria METHICILLIN RESISTANT STAPHYLOCOCCUS AUREUS  Final      Susceptibility   Enterobacter aerogenes - MIC*    CEFAZOLIN RESISTANT Resistant     CEFEPIME <=0.12 SENSITIVE Sensitive     CEFTAZIDIME <=1 SENSITIVE Sensitive     CEFTRIAXONE <=0.25 SENSITIVE Sensitive     CIPROFLOXACIN <=0.25 SENSITIVE Sensitive     GENTAMICIN <=1 SENSITIVE Sensitive     IMIPENEM <=0.25 SENSITIVE Sensitive     TRIMETH/SULFA <=20 SENSITIVE Sensitive     PIP/TAZO <=4 SENSITIVE Sensitive     * MODERATE ENTEROBACTER AEROGENES   Methicillin resistant staphylococcus aureus - MIC*    CIPROFLOXACIN >=8 RESISTANT Resistant     ERYTHROMYCIN >=8 RESISTANT Resistant     GENTAMICIN <=0.5 SENSITIVE Sensitive     OXACILLIN >=4 RESISTANT Resistant     TETRACYCLINE <=1 SENSITIVE Sensitive     VANCOMYCIN <=0.5 SENSITIVE Sensitive     TRIMETH/SULFA 160 RESISTANT Resistant     CLINDAMYCIN <=0.25 SENSITIVE Sensitive     RIFAMPIN <=0.5 SENSITIVE Sensitive     Inducible Clindamycin NEGATIVE Sensitive     * MODERATE METHICILLIN RESISTANT STAPHYLOCOCCUS AUREUS      Radiology Studies: No results found.   Scheduled Meds: . chlorhexidine  15 mL Mouth Rinse BID  . Chlorhexidine Gluconate Cloth   6 each Topical Daily  . clonazePAM  3 mg Per Tube TID  . cloNIDine  0.1 mg Per Tube BID   Followed by  . [START ON 08/27/2019] cloNIDine  0.1 mg Per Tube Daily  . docusate  100 mg Per Tube BID  . enoxaparin (LOVENOX) injection  40 mg Subcutaneous Q24H  . feeding supplement (PROSource TF)  45 mL Per Tube TID  . free water  200 mL Per Tube Q6H  . labetalol  100 mg Per Tube BID  . mouth rinse  15 mL Mouth Rinse q12n4p  . melatonin  5 mg Per Tube Daily  . oxyCODONE  30 mg Per Tube Q8H  . pantoprazole sodium  40 mg Per Tube Q24H  . polyethylene glycol  17 g Per Tube Daily  . propranolol  40 mg Per Tube TID  . QUEtiapine  300 mg Per Tube BID  . sodium chloride flush  10-40 mL Intracatheter Q12H  . valproic acid  500 mg Per Tube TID   Continuous Infusions: . sodium chloride Stopped (08/21/19 0548)  . feeding supplement (VITAL 1.5 CAL) 1,000 mL (08/26/19 0121)     LOS: 32 days    Time spent: 25 minutes spent in the coordination of care today   Teddy Spike, DO [facility]  If 7PM-7AM, please contact night-coverage [email or web address] 08/26/2019, 7:50 AM

## 2019-08-26 NOTE — Progress Notes (Signed)
Tylenol administered for pt's elevated temperature. Trach suctioning provided.

## 2019-08-27 ENCOUNTER — Inpatient Hospital Stay (HOSPITAL_COMMUNITY): Payer: Self-pay

## 2019-08-27 LAB — RENAL FUNCTION PANEL
Albumin: 2.5 g/dL — ABNORMAL LOW (ref 3.5–5.0)
Anion gap: 14 (ref 5–15)
BUN: 13 mg/dL (ref 6–20)
CO2: 27 mmol/L (ref 22–32)
Calcium: 9.4 mg/dL (ref 8.9–10.3)
Chloride: 98 mmol/L (ref 98–111)
Creatinine, Ser: 0.97 mg/dL (ref 0.61–1.24)
GFR calc Af Amer: >60 mL/min (ref >60)
GFR calc non Af Amer: >60 mL/min (ref >60)
Glucose, Bld: 99 mg/dL (ref 70–99)
Phosphorus: 6.4 mg/dL — ABNORMAL HIGH (ref 2.5–4.6)
Potassium: 4.4 mmol/L (ref 3.5–5.1)
Sodium: 139 mmol/L (ref 135–145)

## 2019-08-27 LAB — CBC WITH DIFFERENTIAL/PLATELET
Abs Immature Granulocytes: 0.06 10*3/uL (ref 0.00–0.07)
Basophils Absolute: 0.1 10*3/uL (ref 0.0–0.1)
Basophils Relative: 1 %
Eosinophils Absolute: 1.4 10*3/uL — ABNORMAL HIGH (ref 0.0–0.5)
Eosinophils Relative: 10 %
HCT: 31.9 % — ABNORMAL LOW (ref 39.0–52.0)
Hemoglobin: 10.5 g/dL — ABNORMAL LOW (ref 13.0–17.0)
Immature Granulocytes: 0 %
Lymphocytes Relative: 21 %
Lymphs Abs: 2.8 10*3/uL (ref 0.7–4.0)
MCH: 28.2 pg (ref 26.0–34.0)
MCHC: 32.9 g/dL (ref 30.0–36.0)
MCV: 85.5 fL (ref 80.0–100.0)
Monocytes Absolute: 0.8 10*3/uL (ref 0.1–1.0)
Monocytes Relative: 6 %
Neutro Abs: 8.2 10*3/uL — ABNORMAL HIGH (ref 1.7–7.7)
Neutrophils Relative %: 62 %
Platelets: 327 10*3/uL (ref 150–400)
RBC: 3.73 MIL/uL — ABNORMAL LOW (ref 4.22–5.81)
RDW: 14.2 % (ref 11.5–15.5)
WBC: 13.4 10*3/uL — ABNORMAL HIGH (ref 4.0–10.5)
nRBC: 0 % (ref 0.0–0.2)

## 2019-08-27 LAB — MAGNESIUM: Magnesium: 1.8 mg/dL (ref 1.7–2.4)

## 2019-08-27 LAB — GLUCOSE, CAPILLARY
Glucose-Capillary: 106 mg/dL — ABNORMAL HIGH (ref 70–99)
Glucose-Capillary: 105 mg/dL — ABNORMAL HIGH (ref 70–99)
Glucose-Capillary: 109 mg/dL — ABNORMAL HIGH (ref 70–99)
Glucose-Capillary: 100 mg/dL — ABNORMAL HIGH (ref 70–99)
Glucose-Capillary: 107 mg/dL — ABNORMAL HIGH (ref 70–99)

## 2019-08-27 MED ORDER — AMLODIPINE BESYLATE 5 MG PO TABS
5.0000 mg | ORAL_TABLET | Freq: Every day | ORAL | Status: DC
  Administered 2019-08-27 – 2019-09-06 (×11): 5 mg via ORAL
  Filled 2019-08-27 (×10): qty 1

## 2019-08-27 NOTE — Progress Notes (Signed)
Physical Therapy Treatment Patient Details Name: George Kelly MRN: [id number] DOB: 06/15/1990 Today's Date: 08/27/2019    History of Present Illness 29 yo male found unresponsive with probable drug overdose.  PEA arrest with ROSC after about 15 minutes.  Found to have lactic acidosis, AKI from rhabdomyolysis, aspiration pneumonia, shock liver, hypothermia.  UDS positive for cocaine, benzo's, THC.   6/17 tracheostomy.    PT Comments    Patient received in bed, lethargic; did not respond to tactile or verbal stimuli, however did respond to localized stimulation such as sternal rub. Attempted EOB with totalAx2 to increase arousal however unsuccessful as he required MaxA to maintain sitting balance due to strong posterior lean and did not arouse more. Positioned him to comfort in bed with restraints reapplied; per RN he does become very lethargic after pain medication and this is normal for him. Left in bed with all needs met, bed alarm active this afternoon and safety sitter present.     Follow Up Recommendations  CIR;Supervision/Assistance - 24 hour     Equipment Recommendations  Other (comment) (defer)    Recommendations for Other Services       Precautions / Restrictions Precautions Precautions: Fall Precaution Comments: condom cath,  restraints Restrictions Weight Bearing Restrictions: No    Mobility  Bed Mobility Overal bed mobility: Needs Assistance Bed Mobility: Rolling;Supine to Sit;Sit to Supine Rolling: Total assist   Supine to sit: Total assist;+2 for physical assistance Sit to supine: Total assist;+2 for physical assistance   General bed mobility comments: very lethargic and requiring totalAx2 for all mobility today, strong posterior lean in sitting EOB  Transfers                 General transfer comment: deferred- lethargy  Ambulation/Gait             General Gait Details: deferred- lethargy   Stairs             Wheelchair Mobility     Modified Rankin (Stroke Patients Only)       Balance Overall balance assessment: Needs assistance Sitting-balance support: Feet supported;Bilateral upper extremity supported Sitting balance-Leahy Scale: Poor Sitting balance - Comments: maxA to maintain upright due to lethargy and posterior lean Postural control: Posterior lean                                  Cognition Arousal/Alertness: Lethargic Behavior During Therapy: Flat affect Overall Cognitive Status: Difficult to assess                                 General Comments: patient with eyes open perhaps 10% of session and not following commands      Exercises      General Comments General comments (skin integrity, edema, etc.): VSS WNL; had recently received oxycodone from RN which she reports post-session really effects him quickly and can cuase this lethargy      Pertinent Vitals/Pain Pain Assessment: Faces Faces Pain Scale: No hurt    Home Living                      Prior Function            PT Goals (current goals can now be found in the care plan section) Acute Rehab PT Goals Patient Stated Goal: none stated PT Goal Formulation: With patient  Time For Goal Achievement: 09/06/19 Potential to Achieve Goals: Fair Progress towards PT goals: Not progressing toward goals - comment (limited by lethargy and sedation today)    Frequency    Min 3X/week      PT Plan Current plan remains appropriate    Co-evaluation              AM-PAC PT "6 Clicks" Mobility   Outcome Measure  Help needed turning from your back to your side while in a flat bed without using bedrails?: Total Help needed moving from lying on your back to sitting on the side of a flat bed without using bedrails?: Total Help needed moving to and from a bed to a chair (including a wheelchair)?: Total Help needed standing up from a chair using your arms (e.g., wheelchair or bedside chair)?:  Total Help needed to walk in hospital room?: Total Help needed climbing 3-5 steps with a railing? : Total 6 Click Score: 6    End of Session Equipment Utilized During Treatment: Oxygen Activity Tolerance: Patient limited by lethargy Patient left: in bed;with call bell/phone within reach;with bed alarm set Nurse Communication: Mobility status PT Visit Diagnosis: Muscle weakness (generalized) (M62.81);Difficulty in walking, not elsewhere classified (R26.2);Other abnormalities of gait and mobility (R26.89)     Time: 5997-7414 PT Time Calculation (min) (ACUTE ONLY): 15 min  Charges:  $Therapeutic Activity: 8-22 mins                    Windell Norfolk, DPT, PN1   Supplemental Physical Therapist [place]    Pager [phone] Acute Rehab Office [phone]

## 2019-08-27 NOTE — Progress Notes (Signed)
PROGRESS NOTE    George Kelly  XVQ:[id number] DOB: 06/15/1990 DOA: 07/25/2019 PCP: Patient, No Pcp Per   Brief Narrative:   29 yo male found unresponsive with probable drug overdose. PEA arrest with ROSC after about 15 minutes. Found to have lactic acidosis, AKI from rhabdomyolysis, aspiration pneumonia, shock liver, hypothermia. UDS positive for cocaine, benzo's, THC. Weaned off of precedex, no longer in restraints and weaning medications prior to transfer from unit.  7/12: More active this AM and trying to talk. He is following some commands. Increase norvasc. Continue to wean clonidine.   Assessment & Plan:   Active Problems:   Cardiac arrest (HCC)   Hyperkalemia   Acute respiratory failure with hypoxia (HCC)   Acute renal failure with tubular necrosis (HCC)   PEA (Pulseless electrical activity) (HCC)   Acute hypoxemic respiratory failure (HCC)   Acute renal failure (ARF) (HCC)   Status post tracheostomy (HCC)   Tracheostomy tube present (HCC)   Polysubstance abuse (HCC)   Leukocytosis   Acute blood loss anemia  Acute Hypoxic Respiratory Failure Cardiac Arrest Aspiration Pneumonia/Enterobacter PNA - tracheostomy collar since 7/2; continue trach care  - tolerating PMV - RT and SLP following  - continue to wean as able; he is more alert, hopefully this can speed up the process  Nutrition - CorTrak with tube feeds   Fever - afebrile last 72 hrs; WBC stable - prn tylenol, ibuprofen  Acute metabolic encephalopathy 2nd to anoxia, sepsis (secondary to Enterobacter PNA) 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 - ativan 2mg  PRN  - klonopin 3mg  TID - Clonidine taper per pharmacy protocol  - decrease oxycodone to 35 mg TID  - decrease Seroquel to 300 mg BID - continue propranolol, recommend tapering as tolerated - 7/11: has remained off precedex; continue clonidine taper; oxycodone at 30mg  TID, wean  oxycodone 5mg  every 4 days as per PCCM recommendation from 08/22/19 note (next wean 7/14)   Hypertension - clonidine taper  - continue labetalol, consider change to amlodipine once out of ICU and wean off of labetalol  - goal SBP <150     - increase amlodipine to 5mg .  Anemia of critical illness - Hgb is 10.6 this AM, transfuse for <7.   Deconditioning - recommended for CIR; see their note  Hypomagnesemia     - replaced; Mg2+ acceptable  Fever     - he had fever ON, low grade this AM, WBC moving up     - rpt CXR  DVT prophylaxis: lovenox Code Status: FULL Family Communication: None at bedside   Status is: Inpatient  Remains inpatient appropriate because:Inpatient level of care appropriate due to severity of illness   Dispo: The patient is from: Home              Anticipated d/c is to: CIR              Anticipated d/c date is: > 3 days              Patient currently is not medically stable to d/c.  Consultants:   PCCM  Nephrology  Procedures:   ETT  R Chest Tube  Trach  R Femerol HD cath  R [place] CVL  L radial a line  ROS:  Unable to obtain d/t mentation . Remainder 10-pt ROS is negative for all not previously mentioned.  Subjective: Continues to be active. Requiring more suction.   Objective: Vitals:   08/27/19 0730 08/27/19 0732 08/27/19 0800 08/27/19 1042  BP: 128/64 116/73 127/64 137/69  Pulse: 96 95 92 97  Resp: 11 12 11 18   Temp:  100 F (37.8 C)    TempSrc:  Axillary    SpO2: 97% 97% 96% 95%  Weight:      Height:        Intake/Output Summary (Last 24 hours) at 08/27/2019 1122 Last data filed at 08/27/2019 0300 Gross per 24 hour  Intake 604.4 ml  Output 600 ml  Net 4.4 ml   Filed Weights   08/24/19 0343 08/25/19 0228 08/26/19 0344  Weight: 75.8 kg 74.4 kg 75.3 kg    Examination:  General: 29 y.o. male resting in bed in NAD, trach'd Cardiovascular: tachy, +S1, S2, no m/g/r Respiratory: Upper airway  transmission, soft scattered rhonchi noted, no w/r, trach'd on blowby GI: BS+, NDNT, coretrak MSK: No e/c/c Neuro: Alert, following some commands, trying to speak, but confused  Data Reviewed: I have personally reviewed following labs and imaging studies.  CBC: Recent Labs  Lab 08/22/19 0534 08/23/19 0622 08/24/19 0405 08/25/19 0304 08/26/19 0359  WBC 12.3* 11.6* 10.7* 11.2* 11.0*  NEUTROABS  --   --   --  7.0 6.9  HGB 10.3* 10.0* 10.6* 10.7* 10.6*  HCT 31.9* 31.1* 33.1* 33.1* 32.6*  MCV 87.6 87.1 85.1 86.0 84.7  PLT 394 387 366 375 313   Basic Metabolic Panel: Recent Labs  Lab 08/22/19 0534 08/24/19 1406 08/25/19 0304 08/26/19 0359  NA 138 139 139 137  K 4.3 4.0 4.4 4.1  CL 96* 98 99 98  CO2 32 30 28 24   GLUCOSE 108* 118* 111* 120*  BUN 17 14 15 13   CREATININE 0.82 0.88 0.87 0.79  CALCIUM 9.1 9.1 9.4 9.5  MG  --   --  1.7 1.6*  PHOS  --  5.2* 6.4*  --    GFR: Estimated Creatinine Clearance: 146.4 mL/min (by C-G formula based on SCr of 0.79 mg/dL). Liver Function Tests: Recent Labs  Lab 08/24/19 1406 08/25/19 0304 08/26/19 0359  AST  --   --  51*  ALT  --   --  108*  ALKPHOS  --   --  214*  BILITOT  --   --  0.3  PROT  --   --  8.3*  ALBUMIN 2.3* 2.3* 2.3*   No results for input(s): LIPASE, AMYLASE in the last 168 hours. No results for input(s): AMMONIA in the last 168 hours. Coagulation Profile: No results for input(s): INR, PROTIME in the last 168 hours. Cardiac Enzymes: No results for input(s): CKTOTAL, CKMB, CKMBINDEX, TROPONINI in the last 168 hours. BNP (last 3 results) No results for input(s): PROBNP in the last 8760 hours. HbA1C: No results for input(s): HGBA1C in the last 72 hours. CBG: Recent Labs  Lab 08/26/19 0800 08/26/19 1210 08/26/19 1744 08/26/19 2213 08/27/19 0837  GLUCAP 98 101* 89 100* 106*   Lipid Profile: No results for input(s): CHOL, HDL, LDLCALC, TRIG, CHOLHDL, LDLDIRECT in the last 72 hours. Thyroid Function  Tests: No results for input(s): TSH, T4TOTAL, FREET4, T3FREE, THYROIDAB in the last 72 hours. Anemia Panel: No results for input(s): VITAMINB12, FOLATE, FERRITIN, TIBC, IRON, RETICCTPCT in the last 72 hours. Sepsis Labs: No results for input(s): PROCALCITON, LATICACIDVEN in the last 168 hours.  Recent Results (from the past 240 hour(s))  Culture, respiratory     Status: None   Collection Time: 08/18/19  4:11 PM   Specimen: Tracheal Aspirate; Respiratory  Result Value Ref Range Status  Specimen Description TRACHEAL ASPIRATE  Final   Special Requests NONE  Final   Gram Stain   Final    ABUNDANT WBC PRESENT, PREDOMINANTLY PMN RARE SQUAMOUS EPITHELIAL CELLS PRESENT MODERATE GRAM POSITIVE COCCI IN PAIRS Performed at [facility], [address], [place], [place] [address]    Culture   Final    MODERATE METHICILLIN RESISTANT STAPHYLOCOCCUS AUREUS MODERATE ENTEROBACTER AEROGENES    Report Status 08/21/2019 FINAL  Final   Organism ID, Bacteria ENTEROBACTER AEROGENES  Final   Organism ID, Bacteria METHICILLIN RESISTANT STAPHYLOCOCCUS AUREUS  Final      Susceptibility   Enterobacter aerogenes - MIC*    CEFAZOLIN RESISTANT Resistant     CEFEPIME <=0.12 SENSITIVE Sensitive     CEFTAZIDIME <=1 SENSITIVE Sensitive     CEFTRIAXONE <=0.25 SENSITIVE Sensitive     CIPROFLOXACIN <=0.25 SENSITIVE Sensitive     GENTAMICIN <=1 SENSITIVE Sensitive     IMIPENEM <=0.25 SENSITIVE Sensitive     TRIMETH/SULFA <=20 SENSITIVE Sensitive     PIP/TAZO <=4 SENSITIVE Sensitive     * MODERATE ENTEROBACTER AEROGENES   Methicillin resistant staphylococcus aureus - MIC*    CIPROFLOXACIN >=8 RESISTANT Resistant     ERYTHROMYCIN >=8 RESISTANT Resistant     GENTAMICIN <=0.5 SENSITIVE Sensitive     OXACILLIN >=4 RESISTANT Resistant     TETRACYCLINE <=1 SENSITIVE Sensitive     VANCOMYCIN <=0.5 SENSITIVE Sensitive     TRIMETH/SULFA 160 RESISTANT Resistant     CLINDAMYCIN <=0.25 SENSITIVE Sensitive      RIFAMPIN <=0.5 SENSITIVE Sensitive     Inducible Clindamycin NEGATIVE Sensitive     * MODERATE METHICILLIN RESISTANT STAPHYLOCOCCUS AUREUS      Radiology Studies: No results found.   Scheduled Meds: . amLODipine  5 mg Oral Daily  . chlorhexidine  15 mL Mouth Rinse BID  . Chlorhexidine Gluconate Cloth  6 each Topical Daily  . clonazePAM  3 mg Per Tube TID  . docusate  100 mg Per Tube BID  . enoxaparin (LOVENOX) injection  40 mg Subcutaneous Q24H  . feeding supplement (PROSource TF)  45 mL Per Tube TID  . free water  200 mL Per Tube Q6H  . labetalol  100 mg Per Tube BID  . mouth rinse  15 mL Mouth Rinse q12n4p  . melatonin  5 mg Per Tube Daily  . oxyCODONE  30 mg Per Tube Q8H  . pantoprazole sodium  40 mg Per Tube Q24H  . polyethylene glycol  17 g Per Tube Daily  . propranolol  40 mg Per Tube TID  . QUEtiapine  300 mg Per Tube BID  . sodium chloride flush  10-40 mL Intracatheter Q12H  . valproic acid  500 mg Per Tube TID   Continuous Infusions: . sodium chloride Stopped (08/21/19 0548)  . feeding supplement (VITAL 1.5 CAL) 65 mL/hr at 08/26/19 1000     LOS: 33 days    Time spent: 25 minutes spent in the coordination of care today.    Teddy Spike, DO [facility]  If 7PM-7AM, please contact night-coverage [email or web address] 08/27/2019, 11:22 AM

## 2019-08-28 LAB — RENAL FUNCTION PANEL
Albumin: 2.3 g/dL — ABNORMAL LOW (ref 3.5–5.0)
Anion gap: 10 (ref 5–15)
BUN: 13 mg/dL (ref 6–20)
CO2: 30 mmol/L (ref 22–32)
Calcium: 9.4 mg/dL (ref 8.9–10.3)
Chloride: 96 mmol/L — ABNORMAL LOW (ref 98–111)
Creatinine, Ser: 0.79 mg/dL (ref 0.61–1.24)
GFR calc Af Amer: >60 mL/min (ref >60)
GFR calc non Af Amer: >60 mL/min (ref >60)
Glucose, Bld: 112 mg/dL — ABNORMAL HIGH (ref 70–99)
Phosphorus: 6.1 mg/dL — ABNORMAL HIGH (ref 2.5–4.6)
Potassium: 3.8 mmol/L (ref 3.5–5.1)
Sodium: 136 mmol/L (ref 135–145)

## 2019-08-28 LAB — CBC WITH DIFFERENTIAL/PLATELET
Abs Immature Granulocytes: 0.04 10*3/uL (ref 0.00–0.07)
Basophils Absolute: 0.1 10*3/uL (ref 0.0–0.1)
Basophils Relative: 1 %
Eosinophils Absolute: 1.7 10*3/uL — ABNORMAL HIGH (ref 0.0–0.5)
Eosinophils Relative: 13 %
HCT: 32.3 % — ABNORMAL LOW (ref 39.0–52.0)
Hemoglobin: 10.6 g/dL — ABNORMAL LOW (ref 13.0–17.0)
Immature Granulocytes: 0 %
Lymphocytes Relative: 13 %
Lymphs Abs: 1.7 10*3/uL (ref 0.7–4.0)
MCH: 28 pg (ref 26.0–34.0)
MCHC: 32.8 g/dL (ref 30.0–36.0)
MCV: 85.4 fL (ref 80.0–100.0)
Monocytes Absolute: 0.7 10*3/uL (ref 0.1–1.0)
Monocytes Relative: 5 %
Neutro Abs: 8.8 10*3/uL — ABNORMAL HIGH (ref 1.7–7.7)
Neutrophils Relative %: 68 %
Platelets: 303 10*3/uL (ref 150–400)
RBC: 3.78 MIL/uL — ABNORMAL LOW (ref 4.22–5.81)
RDW: 14.1 % (ref 11.5–15.5)
WBC: 13 10*3/uL — ABNORMAL HIGH (ref 4.0–10.5)
nRBC: 0 % (ref 0.0–0.2)

## 2019-08-28 LAB — GLUCOSE, CAPILLARY
Glucose-Capillary: 98 mg/dL (ref 70–99)
Glucose-Capillary: 98 mg/dL (ref 70–99)
Glucose-Capillary: 106 mg/dL — ABNORMAL HIGH (ref 70–99)
Glucose-Capillary: 102 mg/dL — ABNORMAL HIGH (ref 70–99)
Glucose-Capillary: 98 mg/dL (ref 70–99)
Glucose-Capillary: 91 mg/dL (ref 70–99)

## 2019-08-28 LAB — MAGNESIUM: Magnesium: 1.7 mg/dL (ref 1.7–2.4)

## 2019-08-28 NOTE — Plan of Care (Signed)
  Problem: Health Behavior/Discharge Planning: Goal: Ability to manage health-related needs will improve Outcome: Progressing   Problem: Clinical Measurements: Goal: Ability to maintain clinical measurements within normal limits will improve Outcome: Progressing Goal: Will remain free from infection Outcome: Progressing Goal: Diagnostic test results will improve Outcome: Progressing Goal: Respiratory complications will improve Outcome: Progressing Goal: Cardiovascular complication will be avoided Outcome: Progressing   Problem: Activity: Goal: Risk for activity intolerance will decrease Outcome: Progressing   Problem: Nutrition: Goal: Adequate nutrition will be maintained Outcome: Progressing   Problem: Coping: Goal: Level of anxiety will decrease Outcome: Progressing   Problem: Elimination: Goal: Will not experience complications related to bowel motility Outcome: Progressing Goal: Will not experience complications related to urinary retention Outcome: Progressing   Problem: Pain Managment: Goal: General experience of comfort will improve Outcome: Progressing   Problem: Safety: Goal: Ability to remain free from injury will improve Outcome: Progressing   Problem: Skin Integrity: Goal: Risk for impaired skin integrity will decrease Outcome: Progressing   Problem: Activity: Goal: Ability to tolerate increased activity will improve Outcome: Progressing   Problem: Respiratory: Goal: Ability to maintain a clear airway and adequate ventilation will improve Outcome: Progressing   Problem: Role Relationship: Goal: Method of communication will improve Outcome: Progressing   Problem: Activity: Goal: Ability to tolerate increased activity will improve Outcome: Progressing   Problem: Respiratory: Goal: Ability to maintain a clear airway and adequate ventilation will improve Outcome: Progressing   Problem: Role Relationship: Goal: Method of communication will  improve Outcome: Progressing   

## 2019-08-28 NOTE — Progress Notes (Signed)
Inpatient Rehabilitation Admissions Coordinator  I contacted pt's Mom by phone to discuss caregiver support for patient. She works and cares for her Father and is unable to provide caregiver support for [place]. She states no other family can help. She is requesting SNF placement. She has applied for both medicaid and disability. I will notify TOC team and we will sign off.  Ottie Glazier, RN, MSN Rehab Admissions Coordinator [phone] 08/28/2019 2:42 PM

## 2019-08-28 NOTE — Progress Notes (Signed)
Occupational Therapy Treatment Patient Details Name: George Kelly MRN: [id number] DOB: 06/15/1990 Today's Date: 08/28/2019    History of present illness 29 yo male found unresponsive with probable drug overdose.  PEA arrest with ROSC after about 15 minutes.  Found to have lactic acidosis, AKI from rhabdomyolysis, aspiration pneumonia, shock liver, hypothermia.  UDS positive for cocaine, benzo's, THC.   6/17 tracheostomy.   OT comments  Pt progressing with OT goals, demonstrating ability to follow one step commands 40% of the time, limited mainly by restlessness during this session. Pt demonstrated ability to lift hips in bed and long sit bed level for adjustment of pads with only verbal cues needed. Pt overall Max A for simple grooming tasks, but demonstrated improved initiation. Per chart, CIR denied. Pt would benefit from continued rehab services at acute and subacute level. DC recommendations updated to SNF vs LTACH pending progress.    Follow Up Recommendations  SNF;LTACH    Equipment Recommendations  Other (comment) (TBD)    Recommendations for Other Services      Precautions / Restrictions Precautions Precautions: Fall Precaution Comments: condom cath,  restraints, trach Restrictions Weight Bearing Restrictions: No       Mobility Bed Mobility Overal bed mobility: Needs Assistance             General bed mobility comments: Pt demonstrated ability to long sit without assistance. with cues, pt demonstrated ability to bend knees and lift hips up for pad placement  Transfers                      Balance                                           ADL either performed or assessed with clinical judgement   ADL Overall ADL's : Needs assistance/impaired     Grooming: Maximal assistance;Wash/dry face;Bed level Grooming Details (indicate cue type and reason): Max A to wash face, attempts to bring wash cloth to face with tactile/verbal cues, but  then pushed therapist's hand away                               General ADL Comments: Pt with restlessness bordering on agitated at times with movements of all extremities in bed. Pt demonstrated ability to acknowledge therapist, follow about 40% of one step commands     Vision       Perception     Praxis      Cognition Arousal/Alertness: Lethargic Behavior During Therapy: Flat affect;Anxious;Restless Overall Cognitive Status: Difficult to assess Area of Impairment: Attention;Following commands;Safety/judgement;Awareness;Problem solving                   Current Attention Level: Focused   Following Commands: Follows one step commands inconsistently Safety/Judgement: Decreased awareness of safety;Decreased awareness of deficits Awareness: Intellectual Problem Solving: Requires verbal cues;Requires tactile cues;Slow processing;Difficulty sequencing General Comments: pt with eyes open 50% of the time. able to follow one step commands inconsistently        Exercises     Shoulder Instructions       General Comments Hr up to 102bpm (during RT suctioning). Pt restless throughout session, difficulty focusing on tasks - deferred OOb activity due secondary to safety and impulsivity concersn    Pertinent Vitals/ Pain  Pain Assessment: Faces Faces Pain Scale: Hurts a little bit Pain Location: trach with suction Pain Descriptors / Indicators: Grimacing Pain Intervention(s): Limited activity within patient's tolerance;Monitored during session  Home Living                                          Prior Functioning/Environment              Frequency  Min 2X/week        Progress Toward Goals  OT Goals(current goals can now be found in the care plan section)  Progress towards OT goals: Progressing toward goals  Acute Rehab OT Goals Patient Stated Goal: none stated OT Goal Formulation: Patient unable to participate in goal  setting Potential to Achieve Goals: Fair ADL Goals Pt Will Perform Grooming: with min assist;sitting Pt/caregiver will Perform Home Exercise Program: Increased strength;Increased ROM;Both right and left upper extremity;With minimal assist;With written HEP provided Additional ADL Goal #1: Pt will tolerate sitting EOB >7 min with minA as precursor to EOB/OOB ADL. Additional ADL Goal #2: Pt will follow 1 step simple command with 75% accuracy during ADL/functional task.  Plan Discharge plan needs to be updated    Co-evaluation                 AM-PAC OT "6 Clicks" Daily Activity     Outcome Measure   Help from another person eating meals?: Total Help from another person taking care of personal grooming?: A Lot Help from another person toileting, which includes using toliet, bedpan, or urinal?: Total Help from another person bathing (including washing, rinsing, drying)?: Total Help from another person to put on and taking off regular upper body clothing?: Total Help from another person to put on and taking off regular lower body clothing?: Total 6 Click Score: 7    End of Session Equipment Utilized During Treatment: Oxygen  OT Visit Diagnosis: Other abnormalities of gait and mobility (R26.89);Muscle weakness (generalized) (M62.81);Other symptoms and signs involving cognitive function   Activity Tolerance Patient limited by lethargy;Other (comment) (limited by restlessness)   Patient Left in bed;with call bell/phone within reach;Other (comment) (with RT)   Nurse Communication Mobility status;Other (comment) (RN present for most of session)        Time: [id number] OT Time Calculation (min): 16 min  Charges: OT General Charges $OT Visit: 1 Visit OT Treatments $Therapeutic Activity: 8-22 mins  Lorre Munroe, OTR/L   Lorre Munroe 08/28/2019, 3:46 PM

## 2019-08-28 NOTE — Progress Notes (Signed)
PROGRESS NOTE    Erma HeritageLamar Elicker  ZOX:[id number] DOB: 06/15/1990 DOA: 07/25/2019 PCP: Patient, No Pcp Per   Brief Narrative:   29 yo male found unresponsive with probable drug overdose. PEA arrest with ROSC after about 15 minutes. Found to have lactic acidosis, AKI from rhabdomyolysis, aspiration pneumonia, shock liver, hypothermia. UDS positive for cocaine, benzo's, THC. Weaned off of precedex, no longer in restraints and weaning medications prior to transfer from unit.  7/13: CXR results noted. Nursing reports that secretion are ok and he is clearing what he has. Labs are stable. If fevers return, consider initiation of abx.   Assessment & Plan:   Active Problems:   Cardiac arrest (HCC)   Hyperkalemia   Acute respiratory failure with hypoxia (HCC)   Acute renal failure with tubular necrosis (HCC)   PEA (Pulseless electrical activity) (HCC)   Acute hypoxemic respiratory failure (HCC)   Acute renal failure (ARF) (HCC)   Status post tracheostomy (HCC)   Tracheostomy tube present (HCC)   Polysubstance abuse (HCC)   Leukocytosis   Acute blood loss anemia  Acute Hypoxic Respiratory Failure Cardiac Arrest Aspiration Pneumonia/Enterobacter PNA - tracheostomy collar since 7/2; continue trach care  - tolerating PMV - RT and SLP following  - more alert today, attempting to speak, following some commands; continue current tx  Nutrition - CorTrak with tube feeds   Fever - afebrile last72hrs; WBC stable - prn tylenol, ibuprofen  Acute metabolic encephalopathy 2nd to anoxia, sepsis (secondary to Enterobacter PNA) 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 - ativan 2mg  PRN  - klonopin 3mg  TID - Clonidine taper per pharmacy protocol  - decrease oxycodone to 35 mg TID  - decrease Seroquel to 300 mg BID - continue propranolol, recommend tapering as tolerated - has remained off precedex; continue clonidine  taper;oxycodone at 30mg  TID, weanoxycodone 5mg  every 4 days as per PCCM recommendation from 08/22/19 note (next wean 7/14)     - more alert today, attempting to speak, following some commands, continue current tx  Hypertension - clonidine taper  - continue labetalol, consider change to amlodipine once out of ICU and wean off of labetalol  - goal SBP <150 - continue amlodipine.  Anemia of critical illness - Hgb is 10.6 this AM, transfuse for <7.   Deconditioning - recommended for CIR; see their note  Hypomagnesemia - replaced; Mg2+ acceptable  Fever     - low grade fevers; CXR w/ potential lower lobe opacity, but not definitive. WBC mildly elevated/stable. Nursing reports secretions have been ok and he is clearing ok. Continue suction as necessary; follow  DVT prophylaxis: lovenox Code Status: FULL Family Communication: None at bedside   Status is: Inpatient  Remains inpatient appropriate because:Inpatient level of care appropriate due to severity of illness   Dispo: The patient is from: Home              Anticipated d/c is to: CIR              Anticipated d/c date is: > 3 days              Patient currently is not medically stable to d/c.  Consultants:   PCCM  Nephrology  Procedures:   ETT placement  R Chest Tube  Trach  R Femoral HD Cath  R [place] CVL  L Radial A-line   Antimicrobials:  . None   ROS:  Unable to obtain d/t mentation  Subjective: Active today.   Objective: Vitals:   08/28/19  0300 08/28/19 0342 08/28/19 0500 08/28/19 0724  BP:  (!) 144/87  138/78  Pulse:  93  87  Resp:  15  14  Temp: 98.8 F (37.1 C)     TempSrc: Axillary     SpO2:  100%  100%  Weight:   71.2 kg   Height:        Intake/Output Summary (Last 24 hours) at 08/28/2019 0803 Last data filed at 08/28/2019 6301 Gross per 24 hour  Intake 2516.17 ml  Output 1375 ml  Net 1141.17 ml   Filed Weights   08/25/19 0228 08/26/19 0344  08/28/19 0500  Weight: 74.4 kg 75.3 kg 71.2 kg    Examination:  General: 29 y.o. male resting in bed in NAD Cardiovascular: RRR, +S1, S2 Respiratory: Good amount of upper airway transmission, clearer at bases, no w/r; trach'd on blowby GI: BS+, NDNT, cortrak MSK: No e/c/c Neuro: Alert and following some commands, he is trying to communication, but is still confused; he does nod "yes" to some questioning  Data Reviewed: I have personally reviewed following labs and imaging studies.  CBC: Recent Labs  Lab 08/24/19 0405 08/25/19 0304 08/26/19 0359 08/27/19 1144 08/28/19 0710  WBC 10.7* 11.2* 11.0* 13.4* 13.0*  NEUTROABS  --  7.0 6.9 8.2* 8.8*  HGB 10.6* 10.7* 10.6* 10.5* 10.6*  HCT 33.1* 33.1* 32.6* 31.9* 32.3*  MCV 85.1 86.0 84.7 85.5 85.4  PLT 366 375 313 327 303   Basic Metabolic Panel: Recent Labs  Lab 08/22/19 0534 08/24/19 1406 08/25/19 0304 08/26/19 0359 08/27/19 1144  NA 138 139 139 137 139  K 4.3 4.0 4.4 4.1 4.4  CL 96* 98 99 98 98  CO2 32 30 28 24 27   GLUCOSE 108* 118* 111* 120* 99  BUN 17 14 15 13 13   CREATININE 0.82 0.88 0.87 0.79 0.97  CALCIUM 9.1 9.1 9.4 9.5 9.4  MG  --   --  1.7 1.6* 1.8  PHOS  --  5.2* 6.4*  --  6.4*   GFR: Estimated Creatinine Clearance: 114.2 mL/min (by C-G formula based on SCr of 0.97 mg/dL). Liver Function Tests: Recent Labs  Lab 08/24/19 1406 08/25/19 0304 08/26/19 0359 08/27/19 1144  AST  --   --  51*  --   ALT  --   --  108*  --   ALKPHOS  --   --  214*  --   BILITOT  --   --  0.3  --   PROT  --   --  8.3*  --   ALBUMIN 2.3* 2.3* 2.3* 2.5*   No results for input(s): LIPASE, AMYLASE in the last 168 hours. No results for input(s): AMMONIA in the last 168 hours. Coagulation Profile: No results for input(s): INR, PROTIME in the last 168 hours. Cardiac Enzymes: No results for input(s): CKTOTAL, CKMB, CKMBINDEX, TROPONINI in the last 168 hours. BNP (last 3 results) No results for input(s): PROBNP in the last 8760  hours. HbA1C: No results for input(s): HGBA1C in the last 72 hours. CBG: Recent Labs  Lab 08/27/19 1722 08/27/19 2014 08/27/19 2304 08/28/19 0305 08/28/19 0758  GLUCAP 109* 100* 107* 98 98   Lipid Profile: No results for input(s): CHOL, HDL, LDLCALC, TRIG, CHOLHDL, LDLDIRECT in the last 72 hours. Thyroid Function Tests: No results for input(s): TSH, T4TOTAL, FREET4, T3FREE, THYROIDAB in the last 72 hours. Anemia Panel: No results for input(s): VITAMINB12, FOLATE, FERRITIN, TIBC, IRON, RETICCTPCT in the last 72 hours. Sepsis Labs: No results for  input(s): PROCALCITON, LATICACIDVEN in the last 168 hours.  Recent Results (from the past 240 hour(s))  Culture, respiratory     Status: None   Collection Time: 08/18/19  4:11 PM   Specimen: Tracheal Aspirate; Respiratory  Result Value Ref Range Status   Specimen Description TRACHEAL ASPIRATE  Final   Special Requests NONE  Final   Gram Stain   Final    ABUNDANT WBC PRESENT, PREDOMINANTLY PMN RARE SQUAMOUS EPITHELIAL CELLS PRESENT MODERATE GRAM POSITIVE COCCI IN PAIRS Performed at [facility], [address], [place], [place] [address]    Culture   Final    MODERATE METHICILLIN RESISTANT STAPHYLOCOCCUS AUREUS MODERATE ENTEROBACTER AEROGENES    Report Status 08/21/2019 FINAL  Final   Organism ID, Bacteria ENTEROBACTER AEROGENES  Final   Organism ID, Bacteria METHICILLIN RESISTANT STAPHYLOCOCCUS AUREUS  Final      Susceptibility   Enterobacter aerogenes - MIC*    CEFAZOLIN RESISTANT Resistant     CEFEPIME <=0.12 SENSITIVE Sensitive     CEFTAZIDIME <=1 SENSITIVE Sensitive     CEFTRIAXONE <=0.25 SENSITIVE Sensitive     CIPROFLOXACIN <=0.25 SENSITIVE Sensitive     GENTAMICIN <=1 SENSITIVE Sensitive     IMIPENEM <=0.25 SENSITIVE Sensitive     TRIMETH/SULFA <=20 SENSITIVE Sensitive     PIP/TAZO <=4 SENSITIVE Sensitive     * MODERATE ENTEROBACTER AEROGENES   Methicillin resistant staphylococcus aureus - MIC*     CIPROFLOXACIN >=8 RESISTANT Resistant     ERYTHROMYCIN >=8 RESISTANT Resistant     GENTAMICIN <=0.5 SENSITIVE Sensitive     OXACILLIN >=4 RESISTANT Resistant     TETRACYCLINE <=1 SENSITIVE Sensitive     VANCOMYCIN <=0.5 SENSITIVE Sensitive     TRIMETH/SULFA 160 RESISTANT Resistant     CLINDAMYCIN <=0.25 SENSITIVE Sensitive     RIFAMPIN <=0.5 SENSITIVE Sensitive     Inducible Clindamycin NEGATIVE Sensitive     * MODERATE METHICILLIN RESISTANT STAPHYLOCOCCUS AUREUS      Radiology Studies: DG CHEST PORT 1 VIEW  Result Date: 08/27/2019 EXAM: PORTABLE CHEST 1 VIEW COMPARISON:  08/19/2019 FINDINGS: Endotracheal tube and feeding tube unchanged. Stable cardiac silhouette. There is increase in bibasilar effusions. Potential bibasilar airspace disease. Upper lungs clear. IMPRESSION: 1. Increase in bilateral pleural effusions. Potential increase in lower lobe airspace disease. 2. Stable support apparatus. Electronically Signed   By: Genevive Bi M.D.   On: 08/27/2019 14:52     Scheduled Meds: . amLODipine  5 mg Oral Daily  . chlorhexidine  15 mL Mouth Rinse BID  . Chlorhexidine Gluconate Cloth  6 each Topical Daily  . clonazePAM  3 mg Per Tube TID  . docusate  100 mg Per Tube BID  . enoxaparin (LOVENOX) injection  40 mg Subcutaneous Q24H  . feeding supplement (PROSource TF)  45 mL Per Tube TID  . free water  200 mL Per Tube Q6H  . labetalol  100 mg Per Tube BID  . mouth rinse  15 mL Mouth Rinse q12n4p  . melatonin  5 mg Per Tube Daily  . oxyCODONE  30 mg Per Tube Q8H  . pantoprazole sodium  40 mg Per Tube Q24H  . polyethylene glycol  17 g Per Tube Daily  . propranolol  40 mg Per Tube TID  . QUEtiapine  300 mg Per Tube BID  . sodium chloride flush  10-40 mL Intracatheter Q12H  . valproic acid  500 mg Per Tube TID   Continuous Infusions: . sodium chloride Stopped (08/21/19 0548)  .  feeding supplement (VITAL 1.5 CAL) 65 mL/hr at 08/26/19 1000     LOS: 34 days    Time spent: 25  minutes spent in the coordination of care today.    Teddy Spike, DO [facility]  If 7PM-7AM, please contact night-coverage [email or web address] 08/28/2019, 8:03 AM

## 2019-08-28 NOTE — Progress Notes (Signed)
Nutrition Follow-up  DOCUMENTATION CODES:   Not applicable  INTERVENTION:   Plan transition to standard formula upon next follow up  Continue tube feedings:  Vital 1.5 at 65 ml/hr via Cortrak  Pro-Source TF 45 mL TID Free water 200 ml Q6 hours Provides 138 g of protein, 2460 kcals, 1094 mL of free water (1894 ml with flushes)Meets 100% estimated nutritional needs  NUTRITION DIAGNOSIS:   Inadequate oral intake related to acute illness as evidenced by NPO status.  Ongoing  GOAL:   Patient will meet greater than or equal to 90% of their needs  Met via TF  MONITOR:   Vent status, Labs, Weight trends, TF tolerance  REASON FOR ASSESSMENT:   Consult, Ventilator Enteral/tube feeding initiation and management  ASSESSMENT:   29 yo male found unresponsive with possible drug OD, admitted post cardiac arrest to ICU on vent with severe shock, AKI with hyperkalemia and acidosis. PMH includes polysubstance abuse.  6/09 CRRT initiated 6/10 CRRT discontinued 6/14 CT abdomen negative for acute process 6/17 Trach placement  6/18 Cortrak placed 7/02 Cortrak replaced  Pt more alert today and following some commands. Trying to get out of bed upon RD follow up. On trach collar, tolerating PMV.  Per RN, pt tolerating current tube feeding regimen. Just had new liter replaced. Will plan to transition to standard formula upon follow up. Plan d/c to CIR.   Admission weight: 86.9 kg  Current weight: 71.2 kg   Medications: colace, miralax Labs: Phosphorus 6.1 (H) CBG 98-107  Diet Order:   Diet Order            Diet NPO time specified  Diet effective now                 EDUCATION NEEDS:   Not appropriate for education at this time  Skin:  Skin Assessment: Skin Integrity Issues: Skin Integrity Issues:: Other (Comment) Other: MASD- groin  Last BM:  7/12  Height:   Ht Readings from Last 1 Encounters:  08/03/19 _0  (1.854 m)    Weight:   Wt Readings from Last 1  Encounters:  08/28/19 71.2 kg    BMI:  Body mass index is 20.71 kg/m.  Estimated Nutritional Needs:   Kcal:  2250-2625 kcals  Protein:  120-160 g  Fluid:  >/= 2 L  Mariana Single RD, LDN Clinical Nutrition Pager listed in [place]

## 2019-08-29 DIAGNOSIS — G931 Anoxic brain damage, not elsewhere classified: Secondary | ICD-10-CM

## 2019-08-29 LAB — CBC WITH DIFFERENTIAL/PLATELET
Abs Immature Granulocytes: 0.04 10*3/uL (ref 0.00–0.07)
Basophils Absolute: 0.1 10*3/uL (ref 0.0–0.1)
Basophils Relative: 1 %
Eosinophils Absolute: 1.8 10*3/uL — ABNORMAL HIGH (ref 0.0–0.5)
Eosinophils Relative: 15 %
HCT: 33.7 % — ABNORMAL LOW (ref 39.0–52.0)
Hemoglobin: 10.9 g/dL — ABNORMAL LOW (ref 13.0–17.0)
Immature Granulocytes: 0 %
Lymphocytes Relative: 18 %
Lymphs Abs: 2.1 10*3/uL (ref 0.7–4.0)
MCH: 27.6 pg (ref 26.0–34.0)
MCHC: 32.3 g/dL (ref 30.0–36.0)
MCV: 85.3 fL (ref 80.0–100.0)
Monocytes Absolute: 0.6 10*3/uL (ref 0.1–1.0)
Monocytes Relative: 5 %
Neutro Abs: 7.2 10*3/uL (ref 1.7–7.7)
Neutrophils Relative %: 61 %
Platelets: 331 10*3/uL (ref 150–400)
RBC: 3.95 MIL/uL — ABNORMAL LOW (ref 4.22–5.81)
RDW: 13.9 % (ref 11.5–15.5)
WBC: 11.8 10*3/uL — ABNORMAL HIGH (ref 4.0–10.5)
nRBC: 0 % (ref 0.0–0.2)

## 2019-08-29 LAB — COMPREHENSIVE METABOLIC PANEL
ALT: 50 U/L — ABNORMAL HIGH (ref 0–44)
AST: 25 U/L (ref 15–41)
Albumin: 2.6 g/dL — ABNORMAL LOW (ref 3.5–5.0)
Alkaline Phosphatase: 162 U/L — ABNORMAL HIGH (ref 38–126)
Anion gap: 10 (ref 5–15)
BUN: 13 mg/dL (ref 6–20)
CO2: 31 mmol/L (ref 22–32)
Calcium: 9.7 mg/dL (ref 8.9–10.3)
Chloride: 99 mmol/L (ref 98–111)
Creatinine, Ser: 0.9 mg/dL (ref 0.61–1.24)
GFR calc Af Amer: >60 mL/min (ref >60)
GFR calc non Af Amer: >60 mL/min (ref >60)
Glucose, Bld: 111 mg/dL — ABNORMAL HIGH (ref 70–99)
Potassium: 4 mmol/L (ref 3.5–5.1)
Sodium: 140 mmol/L (ref 135–145)
Total Bilirubin: 0.6 mg/dL (ref 0.3–1.2)
Total Protein: 9.2 g/dL — ABNORMAL HIGH (ref 6.5–8.1)

## 2019-08-29 LAB — GLUCOSE, CAPILLARY
Glucose-Capillary: 109 mg/dL — ABNORMAL HIGH (ref 70–99)
Glucose-Capillary: 91 mg/dL (ref 70–99)
Glucose-Capillary: 114 mg/dL — ABNORMAL HIGH (ref 70–99)
Glucose-Capillary: 103 mg/dL — ABNORMAL HIGH (ref 70–99)
Glucose-Capillary: 110 mg/dL — ABNORMAL HIGH (ref 70–99)

## 2019-08-29 LAB — CALCIUM, IONIZED
Calcium, Ionized, Serum: 4 mg/dL — ABNORMAL LOW (ref 4.5–5.6)
Calcium, Ionized, Serum: 4.2 mg/dL — ABNORMAL LOW (ref 4.5–5.6)

## 2019-08-29 LAB — MAGNESIUM: Magnesium: 1.8 mg/dL (ref 1.7–2.4)

## 2019-08-29 MED ORDER — OXYCODONE HCL 5 MG PO TABS
20.0000 mg | ORAL_TABLET | Freq: Three times a day (TID) | ORAL | Status: DC
  Administered 2019-08-29 – 2019-08-31 (×5): 20 mg
  Filled 2019-08-29 (×6): qty 4

## 2019-08-29 MED ORDER — MAGNESIUM OXIDE 400 (241.3 MG) MG PO TABS
400.0000 mg | ORAL_TABLET | Freq: Two times a day (BID) | ORAL | Status: DC
  Administered 2019-08-29 – 2019-08-31 (×5): 400 mg
  Filled 2019-08-29 (×5): qty 1

## 2019-08-29 NOTE — Progress Notes (Addendum)
Physical Therapy Treatment Patient Details Name: George Kelly MRN: [id number] DOB: 1990-03-19 Today's Date: 08/29/2019    History of Present Illness 29 yo male found unresponsive with probable drug overdose.  PEA arrest with ROSC after about 15 minutes.  Found to have lactic acidosis, AKI from rhabdomyolysis, aspiration pneumonia, shock liver, hypothermia.  UDS positive for cocaine, benzo's, THC.   6/17 tracheostomy.    PT Comments    Patient received in bed after speech therapy session, much more awake but much more restless and impulsive today, often straining against restraints. Tried to talk to PT/tech but very difficult to understand due to trach. Able to get to EOB with MinA but needed frequent cues and intervention to prevent him pulling at lines. Stood twice with ModAx2 but not quite able to get full hip extension, able to take one small side step and then PT had to guide his hips up the bed as he sat. Became more and more restless during session so needed ModAx2 to safely get him back in bed. Safety sitter assisted in session with line management. He was left in bed with restraints reapplied and all needs otherwise met, safety sitter present. Note CIR has declined patient- updated discharge recommendations and frequency accordingly.     Follow Up Recommendations  SNF;Supervision/Assistance - 24 hour     Equipment Recommendations  Other (comment) (defer to next venue)    Recommendations for Other Services       Precautions / Restrictions Precautions Precautions: Fall Precaution Comments: condom cath,  restraints, trach, cortrak, impulsive Restrictions Weight Bearing Restrictions: No    Mobility  Bed Mobility Overal bed mobility: Needs Assistance Bed Mobility: Supine to Sit;Sit to Supine     Supine to sit: Min assist Sit to supine: Mod assist;+2 for physical assistance   General bed mobility comments: able to long sit in bed with mod(I), needed MinA to direct BLES over  edge of bed but once he got the idea able to get to EOB with very little assist; ModAx2 to return to bed due to increased restlessness and not following commands at EOS  Transfers Overall transfer level: Needs assistance Equipment used: 2 person hand held assist Transfers: Sit to/from Stand Sit to Stand: Mod assist;+2 physical assistance         General transfer comment: able to come to standing with hips slightly flexed wtih modAx2 on multiple occasions, able to take one side step but then needed ModA to guide hips up twoards the head of the bed  Ambulation/Gait             General Gait Details: deferred- safety   Stairs             Wheelchair Mobility    Modified Rankin (Stroke Patients Only)       Balance Overall balance assessment: Needs assistance Sitting-balance support: Feet supported;Bilateral upper extremity supported Sitting balance-Leahy Scale: Fair Sitting balance - Comments: min guard to maintain upright but impulsive and poor safety awareness   Standing balance support: No upper extremity supported;During functional activity Standing balance-Leahy Scale: Poor Standing balance comment: ModAx2, limited by poor coordination and poor safety awareness                            Cognition Arousal/Alertness: Awake/alert Behavior During Therapy: Restless;Impulsive;Flat affect Overall Cognitive Status: Impaired/Different from baseline Area of Impairment: Attention;Following commands;Safety/judgement;Awareness;Problem solving  Current Attention Level: Sustained   Following Commands: Follows one step commands inconsistently Safety/Judgement: Decreased awareness of safety;Decreased awareness of deficits Awareness: Intellectual Problem Solving: Requires verbal cues;Requires tactile cues;Slow processing;Difficulty sequencing General Comments: patient with eyes open 90% of session and very restless but did follow commands  with Max verbal and tactile facilitation approximately 70% of the time      Exercises      General Comments General comments (skin integrity, edema, etc.): HR WNL today      Pertinent Vitals/Pain Pain Assessment: Faces Pain Score: 0-No pain Faces Pain Scale: No hurt    Home Living                      Prior Function            PT Goals (current goals can now be found in the care plan section) Acute Rehab PT Goals Patient Stated Goal: none stated PT Goal Formulation: With patient Time For Goal Achievement: 09/06/19 Potential to Achieve Goals: Fair Progress towards PT goals: Progressing toward goals    Frequency    Min 2X/week      PT Plan Discharge plan needs to be updated;Frequency needs to be updated    Co-evaluation              AM-PAC PT "6 Clicks" Mobility   Outcome Measure  Help needed turning from your back to your side while in a flat bed without using bedrails?: A Little Help needed moving from lying on your back to sitting on the side of a flat bed without using bedrails?: A Little Help needed moving to and from a bed to a chair (including a wheelchair)?: A Lot Help needed standing up from a chair using your arms (e.g., wheelchair or bedside chair)?: A Lot Help needed to walk in hospital room?: Total Help needed climbing 3-5 steps with a railing? : Total 6 Click Score: 12    End of Session Equipment Utilized During Treatment: Oxygen Activity Tolerance: Patient tolerated treatment well Patient left: in bed;with call bell/phone within reach;with restraints reapplied;with nursing/sitter in room Nurse Communication: Mobility status PT Visit Diagnosis: Muscle weakness (generalized) (M62.81);Difficulty in walking, not elsewhere classified (R26.2);Other abnormalities of gait and mobility (R26.89)     Time: 1450-1510 PT Time Calculation (min) (ACUTE ONLY): 20 min  Charges:  $Therapeutic Activity: 8-22 mins                     Windell Norfolk, DPT, PN1   Supplemental Physical Therapist [place]    Pager [phone] Acute Rehab Office [phone]

## 2019-08-29 NOTE — Progress Notes (Signed)
NAME:  George Kelly, MRN:  [id number], DOB:  06/15/1990, LOS: 35 ADMISSION DATE:  07/25/2019, CONSULTATION DATE:  07/25/2019 REFERRING MD:  Dr. Renaye Rakers, CHIEF COMPLAINT:  Cardiac arrest    Brief History   29 yo male found unresponsive with probable drug overdose.  PEA arrest with ROSC after about 15 minutes.  Found to have lactic acidosis, AKI from rhabdomyolysis, aspiration pneumonia, shock liver, hypothermia.  UDS positive for cocaine, benzo's, THC. Weaned off of precedex, no longer in restraints and weaning medications prior to transfer from unit.    Past Medical History  Substance abuse   Significant Hospital Events   6/09 Admit, start TTM and LTM 6/10 CRRT 6/11 off CRRT, rewarming from TTM 6/12 A fib with RVR >> amiodarone 6/25 attempting to wean off IV meds-Precedex-still no happy medium between sedation and agitation 7/5: Precedex off, titration of medications for sedation, precedex restarted 7/6: precedex discontinued, valproic acid started   Consults:  Nephrology - signed off 6/12  Procedures:  ETT 6/09 >> 6/21 Rt chest tube 6/09 >> out Lt radial a line 6/09 >> out  Rt [place] CVL 6/09 >> 6/23  Rt femoral HD cath 6/09 >> 6/12 LUE PICC 6/23 >> Trach 6/21 >>   Significant Diagnostic Tests:   CT head 6/09 >> no acute findings  CT chest 6/09 >> 10% Rt PTX, diffuse tree in bud and nodular opacities, debris within b/l mainstem bronchi, mucous plugging lower lobes  CT abd/pelvis 6/09 >> normal  Renal u/s 6/10 >> echogenic kidney b/l  Echo 6/10 >> EF 55 to 60%, grade 2 DD  CT Head 6/14 >> No acute intracranial abnormality.  CT Chest, abd, pelvis 6/14 >> Extensive bilateral airspace opacities, worsening since prior study.Dense consolidation in both lower lobes with small bilateral effusions. Right chest tube in place. Small residual right anterior pneumothorax. No acute findings in the abdomen or pelvis.  Micro Data:   Antimicrobials:    Interim history/subjective:    08/29/2019 pulmonary asked to reevaluate trach.  We will order Passy-Muir valve as tolerated we will downsized to #4 cuffless trach continue evaluation for possible decannulation  Objective   Blood pressure (!) 151/86, pulse 79, temperature 98.9 F (37.2 C), temperature source Axillary, resp. rate 13, height 6\' 1"  (1.854 m), weight 69.4 kg, SpO2 99 %.    FiO2 (%):  [28 %] 28 %   Intake/Output Summary (Last 24 hours) at 08/29/2019 0936 Last data filed at 08/29/2019 0600 Gross per 24 hour  Intake 1323.83 ml  Output 1700 ml  Net -376.17 ml   Filed Weights   08/26/19 0344 08/28/19 0500 08/29/19 0500  Weight: 75.3 kg 71.2 kg 69.4 kg    Examination: General well-nourished well-developed male who is encephalopathic but attempts to follows commands #6 cuffless trach in place Decreased breath sounds in the bases Heart sounds are regular regular rate and rhythm Abdomen soft nontender feeding tube is in place via nares Extremities are warm and dry   Resolved/Stable Hospital Problem list   Hyperkalemia, Anion gap metabolic acidosis with lactic acidosis, Hypoglycemia, Septic shock, Reactive Afib/RVR, Thrombocytopenia from sepsis, AKI from ATN 2nd to sepsis, Elevated LFTs from hypotension, Gastritis, Hypernatremia, Aspiration pneumonia with Enterobacter (completed ABx 6/23), Rt pneumothorax after CPR  Assessment & Plan:   Acute Hypoxic Respiratory Failure in setting of Cardiac Arrest and Aspiration Pneumonia.  Currently liberated from mechanical ventilatory support.  #6 cuff less trach is in place. 08/29/2019 pulmonary asked to consult for trach recommendations that was placed  on 08/06/2018. We will order RT to downsize a #4 cuffless trach We will ask speech therapy to attempt Passy-Muir valve If tolerates Passy-Muir valve can consider capping Pulmonary critical care will follow on a weekly basis    All other issues per primary  Best practice:  Diet:  TF DVT prophylaxis: lovenox GI  prophylaxis: protonix Mobility: As tolerated Code Status: Full Disposition: Stepdown unit Family Update: Per primary  Labs:   CMP Latest Ref Rng & Units 08/28/2019 08/27/2019 08/26/2019  Glucose 70 - 99 mg/dL 423(N) 99 361(W)  BUN 6 - 20 mg/dL 13 13 13   Creatinine 0.61 - 1.24 mg/dL 4.31 5.40  Sodium 135 - 145 mmol/L 136 139 137  Potassium 3.5 - 5.1 mmol/L 3.8 4.4 4.1  Chloride 98 - 111 mmol/L 96(L) 98 98  CO2 22 - 32 mmol/L 30 27 24   Calcium 8.9 - 10.3 mg/dL 9.4 9.4 9.5  Total Protein 6.5 - 8.1 g/dL - - 8.3(H)  Total Bilirubin 0.3 - 1.2 mg/dL - - 0.3  Alkaline Phos 38 - 126 U/L - - 214(H)  AST 15 - 41 U/L - - 51(H)  ALT 0 - 44 U/L - - 108(H)    CBC Latest Ref Rng & Units 08/28/2019 08/27/2019 08/26/2019  WBC 4.0 - 10.5 K/uL 13.0(H) 13.4(H) 11.0(H)  Hemoglobin 13.0 - 17.0 g/dL 10.6(L) 10.5(L) 10.6(L)  Hematocrit 39 - 52 % 32.3(L) 31.9(L) 32.6(L)  Platelets 150 - 400 K/uL 303 327 313    ABG    Component Value Date/Time   PHART 7.438 08/11/2019 1130   PCO2ART 41.8 08/11/2019 1130   PO2ART 84 08/11/2019 1130   HCO3 28.1 (H) 08/11/2019 1130   TCO2 29 08/11/2019 1130   ACIDBASEDEF 6.0 (H) 07/26/2019 1121   O2SAT 96.0 08/11/2019 1130    CBG (last 3)  Recent Labs    08/28/19 2303 08/29/19 0308 08/29/19 0810  GLUCAP 91 109* 91   Steve Lloyd Ayo ACNP Acute Care Nurse Practitioner 08/31/19 Pulmonary/Critical Care Please consult Amion 08/29/2019, 9:36 AM

## 2019-08-29 NOTE — Plan of Care (Signed)
  Problem: Health Behavior/Discharge Planning: Goal: Ability to manage health-related needs will improve Outcome: Progressing   Problem: Clinical Measurements: Goal: Ability to maintain clinical measurements within normal limits will improve Outcome: Progressing Goal: Will remain free from infection Outcome: Progressing Goal: Diagnostic test results will improve Outcome: Progressing Goal: Respiratory complications will improve Outcome: Progressing Goal: Cardiovascular complication will be avoided Outcome: Progressing   Problem: Activity: Goal: Risk for activity intolerance will decrease Outcome: Progressing   Problem: Nutrition: Goal: Adequate nutrition will be maintained Outcome: Progressing   Problem: Coping: Goal: Level of anxiety will decrease Outcome: Progressing   Problem: Elimination: Goal: Will not experience complications related to bowel motility Outcome: Progressing Goal: Will not experience complications related to urinary retention Outcome: Progressing   Problem: Pain Managment: Goal: General experience of comfort will improve Outcome: Progressing   Problem: Safety: Goal: Ability to remain free from injury will improve Outcome: Progressing   Problem: Skin Integrity: Goal: Risk for impaired skin integrity will decrease Outcome: Progressing   Problem: Activity: Goal: Ability to tolerate increased activity will improve Outcome: Progressing   Problem: Respiratory: Goal: Ability to maintain a clear airway and adequate ventilation will improve Outcome: Progressing   Problem: Role Relationship: Goal: Method of communication will improve Outcome: Progressing   Problem: Activity: Goal: Ability to tolerate increased activity will improve Outcome: Progressing   Problem: Respiratory: Goal: Ability to maintain a clear airway and adequate ventilation will improve Outcome: Progressing   Problem: Role Relationship: Goal: Method of communication will  improve Outcome: Progressing   

## 2019-08-29 NOTE — Progress Notes (Signed)
[facility] PROGRESS NOTE  George Kelly WGY:[id number] DOB: 08-20-1990 DOA: 07/25/2019 PCP: Patient, No Pcp Per  Status: Inpatient---Remains inpatient appropriate because:Altered mental status, Unsafe d/c plan and Inpatient level of care appropriate due to severity of illness   Dispo: The patient is from: Home              Anticipated d/c is to: SNF              Anticipated d/c date is: > 3 days              Patient currently is not medically stable to d/c.  Patient continues to require one-on-one supervision in restraints because of sequelae from anoxic brain injury.  He is progressing slowly with PT and OT and currently requires nasogastric feeding tube.  SLP working with patient in an attempt to initiate oral diet but if unsuccessful will need PEG tube.  Mother applying for guardianship and Medicaid.  HPI: 29 yo male found unresponsive with probable drug overdose. PEA arrest with ROSC after about 15 minutes. Found to have lactic acidosis, AKI from rhabdomyolysis, aspiration pneumonia, shock liver, hypothermia. UDS positive for cocaine, benzo's, THC. Weaned off of precedex and weaning some of his sedative and psychotropic medications prior to transfer from unit.  7/13: CXR results noted. Nursing reports that secretion are ok and he is clearing what he has. Labs are stable. If fevers return, consider initiation of abx.   Subjective: Nonverbal secondary to trach Nodding to questions asked and attempts to phonate over trach Very restless and agitated and overall appears confused When asked denies pain  Objective: Vitals:   08/29/19 0725 08/29/19 0808  BP:  (!) 151/86  Pulse: 83 79  Resp: 16 13  Temp:  98.9 F (37.2 C)  SpO2: 99% 99%    Intake/Output Summary (Last 24 hours) at 08/29/2019 1153 Last data filed at 08/29/2019 0600 Gross per 24 hour  Intake 1323.83 ml  Output 1700 ml  Net -376.17 ml   Filed Weights   08/26/19 0344 08/28/19 0500 08/29/19 0500  Weight: 75.3  kg 71.2 kg 69.4 kg    Exam: Constitutional: NAD, restless and mildly anxious, peers to be comfortable comfortable ENMT: Mucous membranes are moist.Normal dentition.  Neck: normal, supple, no masses, no thyromegaly-midline tracheostomy patent Respiratory: clear to auscultation bilaterally, no wheezing, no crackles. Normal respiratory effort. No accessory muscle use.  Trach collar 28% FiO2 Cardiovascular: Regular rate and rhythm, no murmurs / rubs / gallops. No extremity edema. 2+ pedal pulses. No carotid bruits.  Abdomen: no tenderness, no masses palpated. No hepatosplenomegaly. Bowel sounds positive. Cortrack tube inserted through nares with tube feeding infusing. Genitourinary: Condom catheter in place with clear urine noted Musculoskeletal: no clubbing / cyanosis. No joint deformity upper and lower extremities. Good ROM, no contractures. Normal muscle tone.  Skin: no rashes, lesions, ulcers. No induration Neurologic: CN 2-12 grossly intact. Sensation intact, DTR not assessed.  Strength 5/5 x all 4 extremities.  Psychiatric: Alert but patient nonverbal secondary to trach tube and unable to accurately assess orientation/mentation   Assessment/Plan: Acute on chronic hypoxic Respiratory Failure requiring tracheostomy tube Out of hospital cardiac Arrest Aspiration Pneumonia/Enterobacter PNA - tracheostomy collar since 7/2; continue trach care -has #6 cuffless Shiley trach in place - tolerating PMV - RT and SLP following -on 7/14 have reconsulted trach team -Alert and attempting to speak, following some commands  Dysphagia - CorTrak with tube feeds  -SLP to continue to follow to see if can eventually  advance to oral diet-full can avoid transition to PEG tube  Nutrition Status: Nutrition Problem: Inadequate oral intake Etiology: acute illness Signs/Symptoms: NPO status Interventions: Tube feeding  Fever -TM 100.3 past 48 hours doubt any obvious source of infection -WBC trending  down -Clear related to atelectasis dehydration  Acute metabolic encephalopathy 2nd to anoxic brain injury, sepsis  Hx of substance abuse -Patient currently without withdrawal symptoms and has been continued on appropriate medications during the hospitalization -Continue Ativan 2 mg as needed, scheduled Klonopin 3 mg 3 times daily -Continue withdrawal protocol for clonidine taper as recommended by pharmacy -We will continue to decrease scheduled oxycodone every 4 days-on 7/14 have decreased from 30 mg to 20 mg 3 times daily -Continue Seroquel 300 mg twice daily for now -Continue propranolol and taper as recommended -Quite alert but restless and although he is tempting to communicate to determine if he has true understanding of current illness and circumstances -High risk for fall and becomes agitated especially in the evening therefore will need to continue 1: 1 sitter straight  Hypertension - clonidine taper as above -Continue amlodipine with goal SBP <150  Anemia of critical illness -Globin stable at 10.9-would transfuse for <7.   Physical deconditioning -PT and OT following with recommendation for LTAC versus SNF -Does not have appropriate 24-hour care after discharge therefore declined by CIR  Hypomagnesemia -Magnesium 1.8 after replacement    Data Reviewed: Basic Metabolic Panel: Recent Labs  Lab 08/24/19 1406 08/24/19 1406 08/25/19 0304 08/26/19 0359 08/27/19 1144 08/28/19 0710 08/29/19 0947  NA 139   < > 139 137 139 136 140  K 4.0   < > 4.4 4.1 4.4 3.8 4.0  CL 98   < > 99 98 98 96* 99  CO2 30   < > 28 24 27 30 31   GLUCOSE 118*   < > 111* 120* 99 112* 111*  BUN 14   < > 15 13 13 13 13   CREATININE 0.88   < > 0.87 0.79 0.97 0.79 0.90  CALCIUM 9.1   < > 9.4 9.5 9.4 9.4 9.7  MG  --   --  1.7 1.6* 1.8 1.7 1.8  PHOS 5.2*  --  6.4*  --  6.4* 6.1*  --    < > = values in this interval not displayed.   Liver Function Tests: Recent Labs  Lab 08/25/19 0304  08/26/19 0359 08/27/19 1144 08/28/19 0710 08/29/19 0947  AST  --  51*  --   --  25  ALT  --  108*  --   --  50*  ALKPHOS  --  214*  --   --  162*  BILITOT  --  0.3  --   --  0.6  PROT  --  8.3*  --   --  9.2*  ALBUMIN 2.3* 2.3* 2.5* 2.3* 2.6*   No results for input(s): LIPASE, AMYLASE in the last 168 hours. No results for input(s): AMMONIA in the last 168 hours. CBC: Recent Labs  Lab 08/25/19 0304 08/26/19 0359 08/27/19 1144 08/28/19 0710 08/29/19 0947  WBC 11.2* 11.0* 13.4* 13.0* 11.8*  NEUTROABS 7.0 6.9 8.2* 8.8* 7.2  HGB 10.7* 10.6* 10.5* 10.6* 10.9*  HCT 33.1* 32.6* 31.9* 32.3* 33.7*  MCV 86.0 84.7 85.5 85.4 85.3  PLT 375 313 327 303 331   Cardiac Enzymes: No results for input(s): CKTOTAL, CKMB, CKMBINDEX, TROPONINI in the last 168 hours. BNP (last 3 results) No results for input(s): BNP in the last  8760 hours.  ProBNP (last 3 results) No results for input(s): PROBNP in the last 8760 hours.  CBG: Recent Labs  Lab 08/28/19 1610 08/28/19 1920 08/28/19 2303 08/29/19 0308 08/29/19 0810  GLUCAP 102* 98 91 109* 91    No results found for this or any previous visit (from the past 240 hour(s)).   Studies: DG CHEST PORT 1 VIEW  Result Date: 08/27/2019 EXAM: PORTABLE CHEST 1 VIEW COMPARISON:  08/19/2019 FINDINGS: Endotracheal tube and feeding tube unchanged. Stable cardiac silhouette. There is increase in bibasilar effusions. Potential bibasilar airspace disease. Upper lungs clear. IMPRESSION: 1. Increase in bilateral pleural effusions. Potential increase in lower lobe airspace disease. 2. Stable support apparatus. Electronically Signed   By: Genevive Bi M.D.   On: 08/27/2019 14:52    Scheduled Meds: . amLODipine  5 mg Oral Daily  . chlorhexidine  15 mL Mouth Rinse BID  . Chlorhexidine Gluconate Cloth  6 each Topical Daily  . clonazePAM  3 mg Per Tube TID  . docusate  100 mg Per Tube BID  . enoxaparin (LOVENOX) injection  40 mg Subcutaneous Q24H  .  feeding supplement (PROSource TF)  45 mL Per Tube TID  . free water  200 mL Per Tube Q6H  . labetalol  100 mg Per Tube BID  . magnesium oxide  400 mg Per Tube BID  . mouth rinse  15 mL Mouth Rinse q12n4p  . melatonin  5 mg Per Tube Daily  . oxyCODONE  20 mg Per Tube Q8H  . pantoprazole sodium  40 mg Per Tube Q24H  . polyethylene glycol  17 g Per Tube Daily  . propranolol  40 mg Per Tube TID  . QUEtiapine  300 mg Per Tube BID  . sodium chloride flush  10-40 mL Intracatheter Q12H  . valproic acid  500 mg Per Tube TID   Continuous Infusions: . sodium chloride Stopped (08/21/19 0548)  . feeding supplement (VITAL 1.5 CAL) 65 mL/hr at 08/26/19 1000    Active Problems:   Cardiac arrest (HCC)   Hyperkalemia   Acute respiratory failure with hypoxia (HCC)   Acute renal failure with tubular necrosis (HCC)   PEA (Pulseless electrical activity) (HCC)   Acute hypoxemic respiratory failure (HCC)   Acute renal failure (ARF) (HCC)   Status post tracheostomy (HCC)   Tracheostomy tube present (HCC)   Polysubstance abuse (HCC)   Leukocytosis   Acute blood loss anemia     Code Status: Full Family Communication: No family at bedside at time of evaluation.  Mother updated by phone on 7/13 by the [facility] admissions coordinator regarding patient not a candidate for treatment they are given lack of caregiver support after discharge.  Mother aware plan is to begin search for SNF or LTAC bed DVT prophylaxis: Lovenox   Consultants:  PCCM  Nephrology  Procedures:  ETT placement  R Chest Tube  Trach  R Femoral HD Cath  R [place] CVL  L Radial A-line   Antibiotics: Anti-infectives (From admission, onward)   Start     Dose/Rate Route Frequency Ordered Stop   08/02/19 1400  ceFEPIme (MAXIPIME) 2 g in sodium chloride 0.9 % 100 mL IVPB        2 g 200 mL/hr over 30 Minutes Intravenous Every 8 hours 08/02/19 1232 08/09/19 0615   07/31/19 1000  linezolid (ZYVOX) IVPB 600 mg  Status:  Discontinued         600 mg 300 mL/hr over 60 Minutes Intravenous Every 12  hours 07/31/19 0827 08/02/19 0736   07/31/19 0830  meropenem (MERREM) 1 g in sodium chloride 0.9 % 100 mL IVPB  Status:  Discontinued        1 g 200 mL/hr over 30 Minutes Intravenous Every 8 hours 07/31/19 0827 08/02/19 1232   07/28/19 0800  piperacillin-tazobactam (ZOSYN) IVPB 3.375 g  Status:  Discontinued        3.375 g 12.5 mL/hr over 240 Minutes Intravenous Every 8 hours 07/28/19 0759 07/31/19 0827   07/27/19 1430  linezolid (ZYVOX) IVPB 600 mg  Status:  Discontinued        600 mg 300 mL/hr over 60 Minutes Intravenous Every 12 hours 07/27/19 1418 07/28/19 0749   07/26/19 1630  piperacillin-tazobactam (ZOSYN) IVPB 2.25 g  Status:  Discontinued        2.25 g 100 mL/hr over 30 Minutes Intravenous Every 8 hours 07/26/19 1029 07/28/19 0759   07/26/19 1045  vancomycin (VANCOCIN) IVPB 1000 mg/200 mL premix        1,000 mg 200 mL/hr over 60 Minutes Intravenous  Once 07/26/19 1033 07/26/19 1214   07/26/19 0801  vancomycin variable dose per unstable renal function (pharmacist dosing)  Status:  Discontinued         Does not apply See admin instructions 07/26/19 0801 07/27/19 1418   07/26/19 0700  piperacillin-tazobactam (ZOSYN) IVPB 3.375 g  Status:  Discontinued        3.375 g 100 mL/hr over 30 Minutes Intravenous Every 6 hours 07/26/19 0656 07/26/19 1028   07/25/19 1700  ceFEPIme (MAXIPIME) 2 g in sodium chloride 0.9 % 100 mL IVPB  Status:  Discontinued        2 g 200 mL/hr over 30 Minutes Intravenous Every 24 hours 07/25/19 1642 07/26/19 0650   07/25/19 1645  vancomycin (VANCOREADY) IVPB 1500 mg/300 mL        1,500 mg 150 mL/hr over 120 Minutes Intravenous  Once 07/25/19 1642 07/25/19 1901        Time spent: 30 minutes    Junious SilkAllison Marilea Gwynne ANP  [facility] Pager [phone]. If 7PM-7AM, please contact night-coverage at [email or web address] 08/29/2019, 11:53 AM  LOS: 35 days

## 2019-08-29 NOTE — Progress Notes (Signed)
  Speech Language Pathology Treatment: George Kelly Speaking valve  Patient Details Name: George Kelly MRN: [id number] DOB: [birth year] Today's Date: 08/29/2019 Time: 4742-5956 SLP Time Calculation (min) (ACUTE ONLY): 17 min  Assessment / Plan / Recommendation Clinical Impression  Pt more alert and appropriate than previous session with this therapist. Awake, drowsy and sustained attention improving from 1 second in prior sessions to approximately 5-10. Suctioned thick mucous sitting in trach mask. Trach now cuffless and pt wore valve for 12-15 minutes moving air to vocal cords. One word runs into the next and minimal labial movement making intelligibility challenging (with background noise of trach)  for more than 25% overall intelligibility. He was not stimulable for strategies this session. Hard cough x 2 blowing valve off trach. Vitals were within normal range. He is making slow progress now toward goals. Recommend valve use with staff with full supervision.      HPI HPI: 29 yo male found unresponsive with probable drug overdose.  PEA arrest with ROSC after about 15 minutes.  Found to have lactic acidosis, AKI from rhabdomyolysis, aspiration pneumonia, shock liver, hypothermia, acute metabolic encephalopathy 2nd to anoxia, sepsis.  UDS positive for cocaine, benzo's, THC.   ETT 6/09-6/21; 6/21 tracheostomy. Has been tolerating trach collar since 7/2; has cortrak.      SLP Plan  Continue with current plan of care       Recommendations  Diet recommendations: NPO Medication Administration: Via alternative means      Patient may use Passy-Muir Speech Valve: During all therapies with supervision PMSV Supervision: Full         Oral Care Recommendations: Oral care QID Follow up Recommendations: Skilled Nursing facility SLP Visit Diagnosis: Aphonia (R49.1) Plan: Continue with current plan of care       GO                [name] 08/29/2019, 2:59 PM   [name]  Tationa Stech M.Ed Nurse, children's [phone] Office [phone]

## 2019-08-29 NOTE — Evaluation (Addendum)
Clinical/Bedside Swallow Evaluation Patient Details  Name: George Kelly MRN: [id number] Date of Birth: 1990/09/04  Today's Date: 08/29/2019 Time: SLP Start Time (ACUTE ONLY): 1438 SLP Stop Time (ACUTE ONLY): 1447 SLP Time Calculation (min) (ACUTE ONLY): 9 min  Past Medical History:  Past Medical History:  Diagnosis Date  . Cardiac arrest (HCC)   . Polysubstance abuse [facility])    Past Surgical History: History reviewed. No pertinent surgical history. HPI:  29 yo male found unresponsive with probable drug overdose.  PEA arrest with ROSC after about 15 minutes.  Found to have lactic acidosis, AKI from rhabdomyolysis, aspiration pneumonia, shock liver, hypothermia, acute metabolic encephalopathy 2nd to anoxia, sepsis.  UDS positive for cocaine, benzo's, THC.   ETT 6/09-6/21; 6/21 tracheostomy. Has been tolerating trach collar since 7/2; has cortrak.   Assessment / Plan / Recommendation Clinical Impression  Pt has made overall progress in order to initiate swallow assessment today. Prior pt was sedated and unable to awaken with stimulation. Restraint removed and pt brushed his teeth with therapist providing 50% tactile support. Followed simple commands 60% of the time. Mild pooling of saliva noted on right. He refused ice chips but accepted spoon sip water with poor oral cohesion. More accepting of applesauce but attention waned after a few trials. He not yet ready to initiate po's yet given decreased cognitive ability but improving and ST will attempt to see pt more frequently now that he is better able to participate. It is difficult to predict when he will be ready but would like to continue efforts to avoid longer term nutrition if able.         SLP Visit Diagnosis: Dysphagia, unspecified (R13.10)    Aspiration Risk  Mild aspiration risk;Moderate aspiration risk    Diet Recommendation NPO   Medication Administration: Via alternative means    Other  Recommendations Oral Care  Recommendations: Oral care QID   Follow up Recommendations Skilled Nursing facility      Frequency and Duration min 2x/week  2 weeks       Prognosis Prognosis for Safe Diet Advancement: Good Barriers to Reach Goals: Cognitive deficits      Swallow Study   General HPI: 29 yo male found unresponsive with probable drug overdose.  PEA arrest with ROSC after about 15 minutes.  Found to have lactic acidosis, AKI from rhabdomyolysis, aspiration pneumonia, shock liver, hypothermia, acute metabolic encephalopathy 2nd to anoxia, sepsis.  UDS positive for cocaine, benzo's, THC.   ETT 6/09-6/21; 6/21 tracheostomy. Has been tolerating trach collar since 7/2; has cortrak. Type of Study: Bedside Swallow Evaluation Previous Swallow Assessment:  (none) Diet Prior to this Study: NPO;NG Tube Temperature Spikes Noted: No Respiratory Status: Trach Trach Size and Type: Uncuffed;#6;With PMSV in place Behavior/Cognition: Requires cueing;Distractible;Other (Comment);Confused (restless) Oral Cavity Assessment:  (difficult to see) Oral Care Completed by SLP: Yes Oral Cavity - Dentition: Adequate natural dentition Vision: Functional for self-feeding Self-Feeding Abilities: Needs assist Patient Positioning: Upright in bed Baseline Vocal Quality: Hoarse;Low vocal intensity Volitional Cough: Strong    Oral/Motor/Sensory Function Overall Oral Motor/Sensory Function: Generalized oral weakness   Ice Chips Ice chips:  (he refused)   Thin Liquid Thin Liquid: Impaired Presentation: Spoon Oral Phase Impairments: Reduced labial seal;Reduced lingual movement/coordination;Poor awareness of bolus Oral Phase Functional Implications: Right lateral sulci pocketing Pharyngeal  Phase Impairments:  (no overt )    Nectar Thick Nectar Thick Liquid: Not tested   Honey Thick Honey Thick Liquid: Not tested   Puree Puree: Impaired Presentation: Spoon  Oral Phase Impairments: Reduced labial seal;Reduced lingual  movement/coordination Pharyngeal Phase Impairments:  (no overt )   Solid     Solid: Not tested      [name] 08/29/2019,3:33 PM  [name] [name].Ed Nurse, children's [phone] Office [phone]

## 2019-08-30 LAB — GLUCOSE, CAPILLARY
Glucose-Capillary: 101 mg/dL — ABNORMAL HIGH (ref 70–99)
Glucose-Capillary: 105 mg/dL — ABNORMAL HIGH (ref 70–99)
Glucose-Capillary: 87 mg/dL (ref 70–99)
Glucose-Capillary: 88 mg/dL (ref 70–99)
Glucose-Capillary: 94 mg/dL (ref 70–99)
Glucose-Capillary: 98 mg/dL (ref 70–99)

## 2019-08-30 MED ORDER — CLONAZEPAM 1 MG PO TABS
1.0000 mg | ORAL_TABLET | Freq: Three times a day (TID) | ORAL | Status: DC
  Administered 2019-08-30 – 2019-08-31 (×3): 1 mg
  Filled 2019-08-30 (×3): qty 1

## 2019-08-30 MED ORDER — RESOURCE THICKENUP CLEAR PO POWD
ORAL | Status: DC | PRN
  Filled 2019-08-30 (×2): qty 125

## 2019-08-30 MED ORDER — LORAZEPAM 2 MG/ML IJ SOLN
1.0000 mg | INTRAMUSCULAR | Status: DC | PRN
  Administered 2019-08-31 – 2019-09-07 (×10): 1 mg via INTRAVENOUS
  Filled 2019-08-30 (×10): qty 1

## 2019-08-30 MED ORDER — QUETIAPINE FUMARATE 100 MG PO TABS
100.0000 mg | ORAL_TABLET | Freq: Two times a day (BID) | ORAL | Status: DC
  Administered 2019-08-30 – 2019-08-31 (×2): 100 mg
  Filled 2019-08-30 (×2): qty 1

## 2019-08-30 MED ORDER — DEXTROSE 5 % IV SOLN: INTRAVENOUS | Status: AC

## 2019-08-30 MED ORDER — PROPRANOLOL HCL 20 MG/5ML PO SOLN
20.0000 mg | Freq: Three times a day (TID) | ORAL | Status: DC
  Administered 2019-08-30 – 2019-08-31 (×3): 20 mg
  Filled 2019-08-30 (×6): qty 5

## 2019-08-30 NOTE — Progress Notes (Signed)
NAME:  George Kelly, MRN:  [id number], DOB:  11-24-1990, LOS: 36 ADMISSION DATE:  07/25/2019, CONSULTATION DATE:  07/25/2019 REFERRING MD:  Dr. Renaye Rakers, CHIEF COMPLAINT:  Cardiac arrest    Brief History   29 yo male found unresponsive with probable drug overdose.  PEA arrest with ROSC after about 15 minutes.  Found to have lactic acidosis, AKI from rhabdomyolysis, aspiration pneumonia, shock liver, hypothermia.  UDS positive for cocaine, benzo's, THC. Weaned off of precedex, no longer in restraints and weaning medications prior to transfer from unit.    Past Medical History  Substance abuse   Significant Hospital Events   6/09 Admit, start TTM and LTM 6/10 CRRT 6/11 off CRRT, rewarming from TTM 6/12 A fib with RVR >> amiodarone 6/25 attempting to wean off IV meds-Precedex-still no happy medium between sedation and agitation 7/5: Precedex off, titration of medications for sedation, precedex restarted 7/6: precedex discontinued, valproic acid started   Consults:  Nephrology - signed off 6/12  Procedures:  ETT 6/09 >> 6/21 Rt chest tube 6/09 >> out Lt radial a line 6/09 >> out  Rt [place] CVL 6/09 >> 6/23  Rt femoral HD cath 6/09 >> 6/12 LUE PICC 6/23 >> Trach 6/21 >> 08/30/2019  Significant Diagnostic Tests:   CT head 6/09 >> no acute findings  CT chest 6/09 >> 10% Rt PTX, diffuse tree in bud and nodular opacities, debris within b/l mainstem bronchi, mucous plugging lower lobes  CT abd/pelvis 6/09 >> normal  Renal u/s 6/10 >> echogenic kidney b/l  Echo 6/10 >> EF 55 to 60%, grade 2 DD  CT Head 6/14 >> No acute intracranial abnormality.  CT Chest, abd, pelvis 6/14 >> Extensive bilateral airspace opacities, worsening since prior study.Dense consolidation in both lower lobes with small bilateral effusions. Right chest tube in place. Small residual right anterior pneumothorax. No acute findings in the abdomen or pelvis.  Micro Data:   Antimicrobials:    Interim  history/subjective:  We will decannulate and monitor.  Objective   Blood pressure 121/77, pulse 74, temperature 98.6 F (37 C), temperature source Axillary, resp. rate 11, height 6\' 1"  (1.854 m), weight 68.9 kg, SpO2 100 %.    FiO2 (%):  [28 %] 28 %   Intake/Output Summary (Last 24 hours) at 08/30/2019 0850 Last data filed at 08/29/2019 2256 Gross per 24 hour  Intake 1490.67 ml  Output 775 ml  Net 715.67 ml   Filed Weights   08/28/19 0500 08/29/19 0500 08/30/19 0500  Weight: 71.2 kg 69.4 kg 68.9 kg    Examination: Well-nourished well-developed male somewhat encephalopathic but does not communicate HEENT within place without trach collar noted, feeding tube in [place] Chest clear to auscultation  Cardiac heart sounds regular rate rhythm Abdomen soft nontender Extremities warm and dry   Resolved/Stable Hospital Problem list   Hyperkalemia, Anion gap metabolic acidosis with lactic acidosis, Hypoglycemia, Septic shock, Reactive Afib/RVR, Thrombocytopenia from sepsis, AKI from ATN 2nd to sepsis, Elevated LFTs from hypotension, Gastritis, Hypernatremia, Aspiration pneumonia with Enterobacter (completed ABx 6/23), Rt pneumothorax after CPR  Assessment & Plan:   Acute Hypoxic Respiratory Failure in setting of Cardiac Arrest and Aspiration Pneumonia.  Currently liberated from mechanical ventilatory support.  #6 cuff less trach is in place. 08/29/2019 pulmonary asked to consult for trach recommendations that was placed on 08/06/2018. 08/30/2019 will decannulate Pulmonary critical care will be available as needed    All other issues per primary  Best practice:  Diet:  TF DVT prophylaxis: lovenox  GI prophylaxis: protonix Mobility: As tolerated Code Status: Full Disposition: Stepdown unit Family Update: Per primary  Labs:   CMP Latest Ref Rng & Units 08/29/2019 08/28/2019 08/27/2019  Glucose 70 - 99 mg/dL 621(H) 086(V) 99  BUN 6 - 20 mg/dL 13 13 13   Creatinine 0.61 - 1.24 mg/dL  7.84 6.96  Sodium 135 - 145 mmol/L 140 136 139  Potassium 3.5 - 5.1 mmol/L 4.0 3.8 4.4  Chloride 98 - 111 mmol/L 99 96(L) 98  CO2 22 - 32 mmol/L 31 30 27   Calcium 8.9 - 10.3 mg/dL 9.7 9.4 9.4  Total Protein 6.5 - 8.1 g/dL 9.2(H) - -  Total Bilirubin 0.3 - 1.2 mg/dL 0.6 - -  Alkaline Phos 38 - 126 U/L 162(H) - -  AST 15 - 41 U/L 25 - -  ALT 0 - 44 U/L 50(H) - -    CBC Latest Ref Rng & Units 08/29/2019 08/28/2019 08/27/2019  WBC 4.0 - 10.5 K/uL 11.8(H) 13.0(H) 13.4(H)  Hemoglobin 13.0 - 17.0 g/dL 10.9(L) 10.6(L) 10.5(L)  Hematocrit 39 - 52 % 33.7(L) 32.3(L) 31.9(L)  Platelets 150 - 400 K/uL 331 303 327    ABG    Component Value Date/Time   PHART 7.438 08/11/2019 1130   PCO2ART 41.8 08/11/2019 1130   PO2ART 84 08/11/2019 1130   HCO3 28.1 (H) 08/11/2019 1130   TCO2 29 08/11/2019 1130   ACIDBASEDEF 6.0 (H) 07/26/2019 1121   O2SAT 96.0 08/11/2019 1130    CBG (last 3)  Recent Labs    08/30/19 0416 08/30/19 0421 08/30/19 0707  GLUCAP <10* 94 101*   [name] ACNP Acute Care Nurse Practitioner 09/01/19 Pulmonary/Critical Care Please consult Amion 08/30/2019, 8:50 AM

## 2019-08-30 NOTE — Progress Notes (Signed)
Patient decannulated per MD order. Stoma looks good, covered with gauze. Patient doing well, no distress, SpO2-100% on RA.

## 2019-08-30 NOTE — Progress Notes (Addendum)
[facility] PROGRESS NOTE  George Kelly EUM:[id number] DOB: [birth year] DOA: 07/25/2019 PCP: Patient, No Pcp Per  Status: Inpatient---Remains inpatient appropriate because:Altered mental status, Unsafe d/c plan and Inpatient level of care appropriate due to severity of illness   Dispo: The patient is from: Home              Anticipated d/c is to: SNF              Anticipated d/c date is: > 3 days              Patient currently is not medically stable to d/c.  Patient continues to require one-on-one supervision in restraints because of sequelae from anoxic brain injury.  He is progressing slowly with PT and OT and currently requires nasogastric feeding tube.  SLP working with patient in an attempt to initiate oral diet but if unsuccessful will need PEG tube.  Mother applying for guardianship and Medicaid. Unable to receive additional care at [facility] due to lack of caregiver support after discharge therefore plan is to discharge to skilled nursing facility  Code Status: Full Family Communication: 7/15 updated patient's mother regarding improved status and removal of tracheostomy tube. DVT prophylaxis: Lovenox  HPI: 29 yo male found unresponsive with probable drug overdose. PEA arrest with ROSC after about 15 minutes. Found to have lactic acidosis, AKI from rhabdomyolysis, aspiration pneumonia, shock liver, hypothermia. UDS positive for cocaine, benzo's, THC. Weaned off of precedex and weaning some of his sedative and psychotropic medications prior to transfer from unit.  7/13: CXR results noted. Nursing reports that secretion are ok and he is clearing what he has. Labs are stable. If fevers return, consider initiation of abx  7/15: Decannulated.   Subjective: Alert, attempting to phonate over trach Confused but asking for pineapple and sweets. Smiled appropriately when jokes made.  Objective: Vitals:   08/30/19 0705 08/30/19 0905  BP: 121/77   Pulse: 74 93  Resp: 11 12  Temp:  98.6 F (37 C)   SpO2: 100% 100%    Intake/Output Summary (Last 24 hours) at 08/30/2019 1237 Last data filed at 08/29/2019 2256 Gross per 24 hour  Intake 1490.67 ml  Output 775 ml  Net 715.67 ml   Filed Weights   08/28/19 0500 08/29/19 0500 08/30/19 0500  Weight: 71.2 kg 69.4 kg 68.9 kg    Exam: Constitutional: NAD, but less restless as compared to 7/14, comfortable ENMT: Mucous membranes are moist.Normal dentition.  Neck: normal, supple, no masses, no thyromegaly-midline tracheostomy patent Respiratory: clear to auscultation bilaterally, Normal respiratory effort. No accessory muscle use.  Trach collar 28% FiO2. Since my initial evaluation patient has been decannulated and maintaining O2 sats of 100% on room air Cardiovascular: Regular rate and rhythm, no murmurs / rubs / gallops. No extremity edema. 2+ pedal pulses.  Abdomen: no tenderness, no masses palpated. No hepatosplenomegaly. Bowel sounds positive. Cortrack tube inserted through nares with tube feeding infusing. Genitourinary: Condom catheter in place with clear urine noted Musculoskeletal: no clubbing / cyanosis. No joint deformity upper and lower extremities. Good ROM, no contractures. Normal muscle tone.  Skin: no rashes, lesions, ulcers. No induration Neurologic: CN 2-12 grossly intact. Sensation intact, DTR not assessed.  Strength 5/5 x all 4 extremities.  Psychiatric: Alert and confused based on minimal exam given trach tube in place. Was able to tell me his name, was asking for sweets and wanted pineapples and apples. Smiled when I asked him if he wanted some chocolate.   Assessment/Plan:  Acute on chronic hypoxic Respiratory Failure requiring tracheostomy tube Out of hospital cardiac Arrest Aspiration Pneumonia/Enterobacter PNA - tracheostomy collar since 7/2; continue trach care -has #6 cuffless [place] trach in place -7/15: Decannulated - SLP following for language retraining -Alert and attempting to speak,  following some commands  Dysphagia -PT evaluation today was able to tolerate some water and applesauce but still ready to have feeding tube removed -Continue CorTrak with tube feeds -hopeful can avoid placement of PEG tube  Nutrition Status: Nutrition Problem: Inadequate oral intake Etiology: acute illness Signs/Symptoms: NPO status Interventions: Tube feeding  Estimated body mass index is 20.05 kg/m as calculated from the following:   Height as of this encounter: 6\' 1"  (1.854 m).   Weight as of this encounter: 68.9 kg.  Fever -TM 100.3 past 72 hours doubt any obvious source of infection with only low-grade peak of 99.6 -WBC trending down -Suspect related to atelectasis   Acute metabolic encephalopathy 2nd to anoxic brain injury, sepsis  Hx of substance abuse -Patient currently without withdrawal symptoms and has been continued on appropriate medications during the hospitalization -7/15 it was noted that patient performed better with PT/OT/SLP prior to receiving sedative medications therefore will begin to titrate offending medications and discontinue if possible -Decrease Ativan from 2 mg prn to 1 mg with plans to eventually discontinue in favor of Klonopin to be used as primary prn agent once fully weaned -Decrease scheduled Klonopin from 3 mg TID to 2 mg TID -Continue withdrawal protocol for clonidine taper as recommended by pharmacy -We will continue to decrease scheduled oxycodone every 4 days-on 7/14 have decreased from 30 mg to 20 mg 3 times daily with plans to decrease further on 7/17 -Decrease Seroquel from 300 mg BID to 200 mg BID -Continue propranolol and taper as recommended -High risk for fall and becomes agitated especially in the evening therefore will need to continue 1: 1 sitter   Hypertension -Continue amlodipine with goal SBP <150 -Continue hold labetalol and consider tapering as tolerated-current SBP well controlled although heart rate averages 70s to  90s -Decrease prn dosage of IV propranolol  Anemia of critical illness -Hgb stable at 10.9-would transfuse for <7.   Physical deconditioning -PT and OT following with recommendation for LTAC versus SNF -Does not have appropriate 24-hour care after discharge therefore declined by CIR  Hypomagnesemia -Magnesium 1.8 after replacement    Data Reviewed: Basic Metabolic Panel: Recent Labs  Lab 08/24/19 1406 08/24/19 1406 08/25/19 0304 08/26/19 0359 08/27/19 1144 08/28/19 0710 08/29/19 0947  NA 139   < > 139 137 139 136 140  K 4.0   < > 4.4 4.1 4.4 3.8 4.0  CL 98   < > 99 98 98 96* 99  CO2 30   < > 28 24 27 30 31   GLUCOSE 118*   < > 111* 120* 99 112* 111*  BUN 14   < > 15 13 13 13 13   CREATININE 0.88   < > 0.87 0.79 0.97 0.79 0.90  CALCIUM 9.1   < > 9.4 9.5 9.4 9.4 9.7  MG  --   --  1.7 1.6* 1.8 1.7 1.8  PHOS 5.2*  --  6.4*  --  6.4* 6.1*  --    < > = values in this interval not displayed.   Liver Function Tests: Recent Labs  Lab 08/25/19 0304 08/26/19 0359 08/27/19 1144 08/28/19 0710 08/29/19 0947  AST  --  51*  --   --  25  ALT  --  108*  --   --  50*  ALKPHOS  --  214*  --   --  162*  BILITOT  --  0.3  --   --  0.6  PROT  --  8.3*  --   --  9.2*  ALBUMIN 2.3* 2.3* 2.5* 2.3* 2.6*   No results for input(s): LIPASE, AMYLASE in the last 168 hours. No results for input(s): AMMONIA in the last 168 hours. CBC: Recent Labs  Lab 08/25/19 0304 08/26/19 0359 08/27/19 1144 08/28/19 0710 08/29/19 0947  WBC 11.2* 11.0* 13.4* 13.0* 11.8*  NEUTROABS 7.0 6.9 8.2* 8.8* 7.2  HGB 10.7* 10.6* 10.5* 10.6* 10.9*  HCT 33.1* 32.6* 31.9* 32.3* 33.7*  MCV 86.0 84.7 85.5 85.4 85.3  PLT 375 313 327 303 331   Cardiac Enzymes: No results for input(s): CKTOTAL, CKMB, CKMBINDEX, TROPONINI in the last 168 hours. BNP (last 3 results) No results for input(s): BNP in the last 8760 hours.  ProBNP (last 3 results) No results for input(s): PROBNP in the last 8760  hours.  CBG: Recent Labs  Lab 08/29/19 2021 08/30/19 0041 08/30/19 0416 08/30/19 0421 08/30/19 0707  GLUCAP 110* 87 <10* 94 101*    No results found for this or any previous visit (from the past 240 hour(s)).   Studies: No results found.  Scheduled Meds: . amLODipine  5 mg Oral Daily  . chlorhexidine  15 mL Mouth Rinse BID  . Chlorhexidine Gluconate Cloth  6 each Topical Daily  . clonazePAM  1 mg Per Tube TID  . docusate  100 mg Per Tube BID  . enoxaparin (LOVENOX) injection  40 mg Subcutaneous Q24H  . feeding supplement (PROSource TF)  45 mL Per Tube TID  . free water  200 mL Per Tube Q6H  . labetalol  100 mg Per Tube BID  . magnesium oxide  400 mg Per Tube BID  . mouth rinse  15 mL Mouth Rinse q12n4p  . melatonin  5 mg Per Tube Daily  . oxyCODONE  20 mg Per Tube Q8H  . pantoprazole sodium  40 mg Per Tube Q24H  . polyethylene glycol  17 g Per Tube Daily  . propranolol  40 mg Per Tube TID  . QUEtiapine  100 mg Per Tube BID  . sodium chloride flush  10-40 mL Intracatheter Q12H  . valproic acid  500 mg Per Tube TID   Continuous Infusions: . sodium chloride Stopped (08/21/19 0548)  . feeding supplement (VITAL 1.5 CAL) 65 mL/hr at 08/26/19 1000    Active Problems:   Cardiac arrest (HCC)   Hyperkalemia   Acute respiratory failure with hypoxia (HCC)   Acute renal failure with tubular necrosis (HCC)   PEA (Pulseless electrical activity) (HCC)   Acute hypoxemic respiratory failure (HCC)   Acute renal failure (ARF) (HCC)   Status post tracheostomy (HCC)   Tracheostomy tube present (HCC)   Polysubstance abuse (HCC)   Leukocytosis   Acute blood loss anemia   Anoxic brain injury (HCC)   Consultants:  PCCM  Nephrology  Procedures:  ETT placement  R Chest Tube  Trach  R Femoral HD Cath  R [place] CVL  L Radial A-line   Antibiotics: Anti-infectives (From admission, onward)   Start     Dose/Rate Route Frequency Ordered Stop   08/02/19 1400  ceFEPIme  (MAXIPIME) 2 g in sodium chloride 0.9 % 100 mL IVPB        2 g 200 mL/hr over  30 Minutes Intravenous Every 8 hours 08/02/19 1232 08/09/19 0615   07/31/19 1000  linezolid (ZYVOX) IVPB 600 mg  Status:  Discontinued        600 mg 300 mL/hr over 60 Minutes Intravenous Every 12 hours 07/31/19 0827 08/02/19 0736   07/31/19 0830  meropenem (MERREM) 1 g in sodium chloride 0.9 % 100 mL IVPB  Status:  Discontinued        1 g 200 mL/hr over 30 Minutes Intravenous Every 8 hours 07/31/19 0827 08/02/19 1232   07/28/19 0800  piperacillin-tazobactam (ZOSYN) IVPB 3.375 g  Status:  Discontinued        3.375 g 12.5 mL/hr over 240 Minutes Intravenous Every 8 hours 07/28/19 0759 07/31/19 0827   07/27/19 1430  linezolid (ZYVOX) IVPB 600 mg  Status:  Discontinued        600 mg 300 mL/hr over 60 Minutes Intravenous Every 12 hours 07/27/19 1418 07/28/19 0749   07/26/19 1630  piperacillin-tazobactam (ZOSYN) IVPB 2.25 g  Status:  Discontinued        2.25 g 100 mL/hr over 30 Minutes Intravenous Every 8 hours 07/26/19 1029 07/28/19 0759   07/26/19 1045  vancomycin (VANCOCIN) IVPB 1000 mg/200 mL premix        1,000 mg 200 mL/hr over 60 Minutes Intravenous  Once 07/26/19 1033 07/26/19 1214   07/26/19 0801  vancomycin variable dose per unstable renal function (pharmacist dosing)  Status:  Discontinued         Does not apply See admin instructions 07/26/19 0801 07/27/19 1418   07/26/19 0700  piperacillin-tazobactam (ZOSYN) IVPB 3.375 g  Status:  Discontinued        3.375 g 100 mL/hr over 30 Minutes Intravenous Every 6 hours 07/26/19 0656 07/26/19 1028   07/25/19 1700  ceFEPIme (MAXIPIME) 2 g in sodium chloride 0.9 % 100 mL IVPB  Status:  Discontinued        2 g 200 mL/hr over 30 Minutes Intravenous Every 24 hours 07/25/19 1642 07/26/19 0650   07/25/19 1645  vancomycin (VANCOREADY) IVPB 1500 mg/300 mL        1,500 mg 150 mL/hr over 120 Minutes Intravenous  Once 07/25/19 1642 07/25/19 1901       Time spent: 30  minutes    Junious Silk ANP  [facility] Pager [phone]. If 7PM-7AM, please contact night-coverage at [email or web address] 08/30/2019, 12:37 PM  LOS: 36 days

## 2019-08-30 NOTE — Progress Notes (Signed)
°  Speech Language Pathology Treatment: Dysphagia  Patient Details Name: George Kelly MRN: [id number] DOB: [birth year] Today's Date: 08/30/2019 Time: 9373-4287 SLP Time Calculation (min) (ACUTE ONLY): 28 min  Assessment / Plan / Recommendation Clinical Impression  Pt making excellent progress and was alert, conversing with therapist and was decannulated this morning. Suspect stoma is closing rapidly- no air leakage audible. Able to sustain attention for water, puree and cracker and tactile assist given with feeding. He coughed immediately with sips water consistently which did not occur with thicker liquids most likely providing increased control and timing for laryngeal closure. SLP ordered diet of Dys 1 (puree), honey thick liquids. Barrier to intake will be lethargy from sedation although this is being decreased by NP. Discussed with RN and pt's sitter to defer eating/drinking if not adequately alert. Can use tube for meds or crush in puree. Full supervision and assist with meals. Prognosis for upgrade is good once stoma is fully closed and pressures restored for airway protection.    HPI HPI: 29 yo male found unresponsive with probable drug overdose.  PEA arrest with ROSC after about 15 minutes.  Found to have lactic acidosis, AKI from rhabdomyolysis, aspiration pneumonia, shock liver, hypothermia, acute metabolic encephalopathy 2nd to anoxia, sepsis.  UDS positive for cocaine, benzo's, THC.   ETT 6/09-6/21; 6/21 tracheostomy. Has been tolerating trach collar since 7/2; has cortrak.      SLP Plan  Continue with current plan of care;Goals updated       Recommendations  Diet recommendations: Dysphagia 1 (puree);Honey-thick liquid Liquids provided via: Cup;Straw Medication Administration:  (crush or NGT) Supervision: Staff to assist with self feeding;Full supervision/cueing for compensatory strategies Compensations: Minimize environmental distractions;Slow rate;Small sips/bites;Lingual sweep  for clearance of pocketing Postural Changes and/or Swallow Maneuvers: Seated upright 90 degrees                Oral Care Recommendations: Oral care BID Follow up Recommendations: Inpatient Rehab SLP Visit Diagnosis: Dysphagia, unspecified (R13.10) Plan: Continue with current plan of care;Goals updated                       George Kelly 08/30/2019, 12:45 PM  George Kelly George Kelly.Ed Nurse, children's [phone] Office [phone]

## 2019-08-30 NOTE — Plan of Care (Signed)
  Problem: Health Behavior/Discharge Planning: Goal: Ability to manage health-related needs will improve Outcome: Progressing   Problem: Clinical Measurements: Goal: Ability to maintain clinical measurements within normal limits will improve Outcome: Progressing Goal: Will remain free from infection Outcome: Progressing Goal: Diagnostic test results will improve Outcome: Progressing Goal: Respiratory complications will improve Outcome: Progressing Goal: Cardiovascular complication will be avoided Outcome: Progressing   Problem: Activity: Goal: Risk for activity intolerance will decrease Outcome: Progressing   Problem: Nutrition: Goal: Adequate nutrition will be maintained Outcome: Progressing   Problem: Coping: Goal: Level of anxiety will decrease Outcome: Progressing   Problem: Elimination: Goal: Will not experience complications related to bowel motility Outcome: Progressing Goal: Will not experience complications related to urinary retention Outcome: Progressing   Problem: Pain Managment: Goal: General experience of comfort will improve Outcome: Progressing   Problem: Safety: Goal: Ability to remain free from injury will improve Outcome: Progressing   Problem: Skin Integrity: Goal: Risk for impaired skin integrity will decrease Outcome: Progressing   Problem: Activity: Goal: Ability to tolerate increased activity will improve Outcome: Progressing   Problem: Respiratory: Goal: Ability to maintain a clear airway and adequate ventilation will improve Outcome: Progressing   Problem: Role Relationship: Goal: Method of communication will improve Outcome: Progressing   Problem: Activity: Goal: Ability to tolerate increased activity will improve Outcome: Progressing   Problem: Respiratory: Goal: Ability to maintain a clear airway and adequate ventilation will improve Outcome: Progressing   Problem: Role Relationship: Goal: Method of communication will  improve Outcome: Progressing   

## 2019-08-31 DIAGNOSIS — R1319 Other dysphagia: Secondary | ICD-10-CM

## 2019-08-31 LAB — GLUCOSE, CAPILLARY
Glucose-Capillary: 104 mg/dL — ABNORMAL HIGH (ref 70–99)
Glucose-Capillary: 80 mg/dL (ref 70–99)
Glucose-Capillary: 96 mg/dL (ref 70–99)
Glucose-Capillary: 99 mg/dL (ref 70–99)
Glucose-Capillary: 99 mg/dL (ref 70–99)

## 2019-08-31 MED ORDER — OXYCODONE HCL 5 MG PO TABS
20.0000 mg | ORAL_TABLET | Freq: Three times a day (TID) | ORAL | Status: DC
  Administered 2019-08-31 – 2019-09-03 (×9): 20 mg via ORAL
  Filled 2019-08-31 (×9): qty 4

## 2019-08-31 MED ORDER — PANTOPRAZOLE SODIUM 40 MG PO PACK
40.0000 mg | PACK | ORAL | Status: DC
  Administered 2019-09-01 – 2019-09-07 (×7): 40 mg via ORAL
  Filled 2019-08-31 (×7): qty 20

## 2019-08-31 MED ORDER — VALPROIC ACID 250 MG/5ML PO SOLN
500.0000 mg | Freq: Three times a day (TID) | ORAL | Status: DC
  Administered 2019-08-31 – 2019-09-11 (×32): 500 mg via ORAL
  Filled 2019-08-31 (×33): qty 10

## 2019-08-31 MED ORDER — LABETALOL HCL 100 MG PO TABS
100.0000 mg | ORAL_TABLET | Freq: Two times a day (BID) | ORAL | Status: DC
  Administered 2019-08-31 – 2019-09-06 (×12): 100 mg via ORAL
  Filled 2019-08-31 (×12): qty 1

## 2019-08-31 MED ORDER — PROPRANOLOL HCL 20 MG/5ML PO SOLN
20.0000 mg | Freq: Three times a day (TID) | ORAL | Status: DC
  Administered 2019-08-31 – 2019-09-06 (×18): 20 mg via ORAL
  Filled 2019-08-31 (×18): qty 5

## 2019-08-31 MED ORDER — DOCUSATE SODIUM 50 MG/5ML PO LIQD
100.0000 mg | Freq: Two times a day (BID) | ORAL | Status: DC
  Administered 2019-08-31 – 2019-09-07 (×13): 100 mg via ORAL
  Filled 2019-08-31 (×13): qty 10

## 2019-08-31 MED ORDER — MELATONIN 5 MG PO TABS
5.0000 mg | ORAL_TABLET | Freq: Every day | ORAL | Status: DC
  Administered 2019-08-31 – 2019-09-12 (×12): 5 mg via ORAL
  Filled 2019-08-31 (×14): qty 1

## 2019-08-31 MED ORDER — MAGNESIUM OXIDE 400 (241.3 MG) MG PO TABS
400.0000 mg | ORAL_TABLET | Freq: Two times a day (BID) | ORAL | Status: DC
  Administered 2019-08-31 – 2019-09-13 (×26): 400 mg via ORAL
  Filled 2019-08-31 (×26): qty 1

## 2019-08-31 MED ORDER — QUETIAPINE FUMARATE 100 MG PO TABS
100.0000 mg | ORAL_TABLET | Freq: Two times a day (BID) | ORAL | Status: DC
  Administered 2019-08-31 – 2019-09-02 (×5): 100 mg via ORAL
  Filled 2019-08-31 (×5): qty 1

## 2019-08-31 MED ORDER — POLYETHYLENE GLYCOL 3350 17 G PO PACK
17.0000 g | PACK | Freq: Every day | ORAL | Status: DC
  Administered 2019-09-01 – 2019-09-08 (×7): 17 g via ORAL
  Filled 2019-08-31 (×8): qty 1

## 2019-08-31 MED ORDER — IBUPROFEN 100 MG/5ML PO SUSP
400.0000 mg | Freq: Four times a day (QID) | ORAL | Status: DC | PRN
  Filled 2019-08-31: qty 20

## 2019-08-31 MED ORDER — CLONAZEPAM 1 MG PO TABS
1.0000 mg | ORAL_TABLET | Freq: Three times a day (TID) | ORAL | Status: DC
  Administered 2019-08-31 – 2019-09-03 (×9): 1 mg via ORAL
  Filled 2019-08-31 (×9): qty 1

## 2019-08-31 NOTE — Significant Event (Signed)
Successfully decannulated 08/30/2019 No distress following decannulation pulmonary critical care will sign off 08/31/2019 please call if we can be of any help for any problems thank you. [name] Daniah Zaldivar ACNP Acute Care Nurse Practitioner [facility] Pulmonary/Critical Care Please consult Amion 08/31/2019, 11:04 AM

## 2019-08-31 NOTE — Progress Notes (Signed)
  Speech Language Pathology Treatment: Dysphagia  Patient Details Name: George Kelly MRN: [id number] DOB: 07-Feb-1991 Today's Date: 08/31/2019 Time: 1202-1228 SLP Time Calculation (min) (ACUTE ONLY): 26 min  Assessment / Plan / Recommendation Clinical Impression  Pt making progress and consumed 100% breakfast with sitter. He was cooperative, restless, confused and redirectable but required frequent cueing to attend. Pt given hand over hand trials of thin via cup and straw- decreased labial seal and cohesion due to poor attention. Immediate cough after thin possibly due to patent stoma after decannulation yesterday (coughing mucous from stoma). Masticated cracker with reduced efficiency and cues for completion. ST upgrading to Dys 2 (fine chopped) and nectar liquids, continue full supervision and assist to prevent large sips and pills whole in puree. Spoke with NP and agreed to remove Cortrak due to adequate and safe intake with recommended diet.     HPI HPI: 29 yo male found unresponsive with probable drug overdose.  PEA arrest with ROSC after about 15 minutes.  Found to have lactic acidosis, AKI from rhabdomyolysis, aspiration pneumonia, shock liver, hypothermia, acute metabolic encephalopathy 2nd to anoxia, sepsis.  UDS positive for cocaine, benzo's, THC.   ETT 6/09-6/21; 6/21 tracheostomy. Has been tolerating trach collar since 7/2; has cortrak.      SLP Plan  Continue with current plan of care       Recommendations  Diet recommendations: Dysphagia 2 (fine chop);Nectar-thick liquid Liquids provided via: Straw Medication Administration: Whole meds with puree Supervision: Staff to assist with self feeding;Full supervision/cueing for compensatory strategies Compensations: Minimize environmental distractions;Slow rate;Small sips/bites;Lingual sweep for clearance of pocketing Postural Changes and/or Swallow Maneuvers: Seated upright 90 degrees                Oral Care Recommendations:  Oral care BID Follow up Recommendations:  (TBD) SLP Visit Diagnosis: Dysphagia, unspecified (R13.10) Plan: Continue with current plan of care                       [name] 08/31/2019, 12:59 PM   [name] [name].Ed Nurse, children's [phone] Office [phone]

## 2019-08-31 NOTE — Progress Notes (Addendum)
[facility] PROGRESS NOTE  George Kelly NOM:[id number] DOB: 16-Aug-1990 DOA: 07/25/2019 PCP: Patient, No Pcp Per  Status: Inpatient---Remains inpatient appropriate because:Altered mental status, Unsafe d/c plan and Inpatient level of care appropriate due to severity of illness   Dispo: The patient is from: Home              Anticipated d/c is to: SNF              Anticipated d/c date is: > 3 days              Patient currently is not medically stable to d/c.  Patient continues to require one-on-one supervision in restraints because of sequelae from anoxic brain injury.  He is progressing slowly with PT and OT and currently requires nasogastric feeding tube.  SLP working with patient in an attempt to initiate oral diet but if unsuccessful will need PEG tube.  Mother applying for guardianship and Medicaid. Unable to receive additional care at [facility] due to lack of caregiver support after discharge therefore plan is to discharge to skilled nursing facility  Code Status: Full Family Communication: 7/15 updated patient's mother regarding improved status and removal of tracheostomy tube. DVT prophylaxis: Lovenox  HPI: 29 yo male found unresponsive with probable drug overdose. PEA arrest with ROSC after about 15 minutes. Found to have lactic acidosis, AKI from rhabdomyolysis, aspiration pneumonia, shock liver, hypothermia. UDS positive for cocaine, benzo's, THC. Weaned off of precedex and weaning some of his sedative and psychotropic medications prior to transfer from unit.  7/13: CXR results noted. Nursing reports that secretion are ok and he is clearing what he has. Labs are stable. If fevers return, consider initiation of abx 7/15: Decannulated.   Subjective: Alert, remains confused but is much less restless than on previous encounters Smiling and asking for pineapple. Sister at bedside  Objective: Vitals:   08/31/19 0415 08/31/19 1125  BP: (!) 145/84 128/78  Pulse: 85 96  Resp: 17  16  Temp: 98.8 F (37.1 C)   SpO2: 100%     Intake/Output Summary (Last 24 hours) at 08/31/2019 1203 Last data filed at 08/31/2019 9628 Gross per 24 hour  Intake 2431.54 ml  Output 2650 ml  Net -218.46 ml   Filed Weights   08/29/19 0500 08/30/19 0500 08/31/19 0500  Weight: 69.4 kg 68.9 kg 68 kg    Exam: Constitutional: NAD, but less restless as compared to 7/14, comfortable ENMT: Mucous membranes are moist.Normal dentition.  Neck: normal, supple, no masses, no thyromegaly-midline tracheostomy patent Respiratory: clear to auscultation bilaterally, Normal respiratory effort. No accessory muscle use.  Trach collar 28% FiO2. Since my initial evaluation patient has been decannulated and maintaining O2 sats of 100% on room air Cardiovascular: Regular rate and rhythm, no murmurs / rubs / gallops. No extremity edema. 2+ pedal pulses.  Abdomen: no tenderness, no masses palpated. No hepatosplenomegaly. Bowel sounds positive. Cortrack tube inserted through nares; nursing reports this tube is malfunctioning and would like to remove if patient ready and appropriate. Genitourinary: Condom catheter in place with clear urine noted Musculoskeletal: no clubbing / cyanosis. No joint deformity upper and lower extremities. Good ROM, no contractures. Normal muscle tone.  Skin: no rashes, lesions, ulcers. No induration Neurologic: CN 2-12 grossly intact. Sensation intact, DTR not assessed.  Strength 5/5 x all 4 extremities.  Psychiatric: Alert and confused -very slow to respond noting it takes patient a period of time to process information presented.  Oriented to name only.  Unable to  tell me exact year, year of birthdate, or president.   Assessment/Plan: Acute on chronic hypoxic Respiratory Failure requiring tracheostomy tube Out of hospital cardiac Arrest Aspiration Pneumonia/Enterobacter PNA - tracheostomy collar since 7/2; continue trach care -has #6 cuffless Shiley trach in place -7/15:  Decannulated and stable on room air - SLP following for language retraining -Alert and attempting to speak, following some commands  Dysphagia -PT evaluation today was able to tolerate some water and applesauce but still ready to have feeding tube removed -Advanced to D2 diet w/ nectar thick liquids -As of 7/16 nursing reports core track tube is malfunctioning-patient ate 100% of currently prescribed dysphagia diet-since tube is malfunctioning and patient tolerating diet we will go ahead and remove core track tube  Nutrition Status: Nutrition Problem: Inadequate oral intake Etiology: acute illness Signs/Symptoms: recent NPO Interventions: Tube feeding to supplement Dysphagia 1 w/ honey thick liquids Estimated body mass index is 19.79 kg/m as calculated from the following:   Height as of this encounter: 6\' 1"  (1.854 m).   Weight as of this encounter: 68 kg.  Fever -TM 100.3 past 72 hours doubt any obvious source of infection with only low-grade peak of 99.6 -WBC trending down -Suspect related to atelectasis   Acute metabolic encephalopathy 2nd to anoxic brain injury, sepsis  Hx of substance abuse -Patient currently without withdrawal symptoms and has been continued on appropriate medications during the hospitalization -7/15 it was noted that patient performed better with PT/OT/SLP prior to receiving sedative medications therefore began titrating offending medications: -Decreased Ativan from 2 mg prn to 1 mg with plans to eventually discontinue in favor of Klonopin to be used as primary prn agent once fully weaned -Decreased scheduled Klonopin from 3 mg TID to 2 mg TID -Continue withdrawal protocol for clonidine taper as recommended by pharmacy -Continue to decrease scheduled oxycodone every 4 days as last adjustment on 7/14 (decreased from 30 mg to 20 mg TID)- plan to decrease further on 7/17 -Decreased Seroquel from 300 mg BID to 200 mg BID -Continue propranolol and taper as  recommended -High risk for fall and becomes agitated especially in the evening therefore will need to continue 1: 1 sitter   Hypertension -Continue amlodipine with goal SBP <150 -Continue labetalol and consider tapering as tolerated-current SBP well controlled although heart rate averages 70s to 90s -Continue IV propranolol prn  Anemia of critical illness -Hgb stable at 10.9-would transfuse for <7.   Physical deconditioning -PT and OT following with recommendation for LTAC versus SNF -Does not have appropriate 24-hour care after discharge therefore declined by CIR  Hypomagnesemia -Magnesium 1.8 after replacement    Data Reviewed: Basic Metabolic Panel: Recent Labs  Lab 08/24/19 1406 08/24/19 1406 08/25/19 0304 08/26/19 0359 08/27/19 1144 08/28/19 0710 08/29/19 0947  NA 139   < > 139 137 139 136 140  K 4.0   < > 4.4 4.1 4.4 3.8 4.0  CL 98   < > 99 98 98 96* 99  CO2 30   < > 28 24 27 30 31   GLUCOSE 118*   < > 111* 120* 99 112* 111*  BUN 14   < > 15 13 13 13 13   CREATININE 0.88   < > 0.87 0.79 0.97 0.79 0.90  CALCIUM 9.1   < > 9.4 9.5 9.4 9.4 9.7  MG  --   --  1.7 1.6* 1.8 1.7 1.8  PHOS 5.2*  --  6.4*  --  6.4* 6.1*  --    < > =  values in this interval not displayed.   Liver Function Tests: Recent Labs  Lab 08/25/19 0304 08/26/19 0359 08/27/19 1144 08/28/19 0710 08/29/19 0947  AST  --  51*  --   --  25  ALT  --  108*  --   --  50*  ALKPHOS  --  214*  --   --  162*  BILITOT  --  0.3  --   --  0.6  PROT  --  8.3*  --   --  9.2*  ALBUMIN 2.3* 2.3* 2.5* 2.3* 2.6*   No results for input(s): LIPASE, AMYLASE in the last 168 hours. No results for input(s): AMMONIA in the last 168 hours. CBC: Recent Labs  Lab 08/25/19 0304 08/26/19 0359 08/27/19 1144 08/28/19 0710 08/29/19 0947  WBC 11.2* 11.0* 13.4* 13.0* 11.8*  NEUTROABS 7.0 6.9 8.2* 8.8* 7.2  HGB 10.7* 10.6* 10.5* 10.6* 10.9*  HCT 33.1* 32.6* 31.9* 32.3* 33.7*  MCV 86.0 84.7 85.5 85.4 85.3  PLT 375  313 327 303 331   Cardiac Enzymes: No results for input(s): CKTOTAL, CKMB, CKMBINDEX, TROPONINI in the last 168 hours. BNP (last 3 results) No results for input(s): BNP in the last 8760 hours.  ProBNP (last 3 results) No results for input(s): PROBNP in the last 8760 hours.  CBG: Recent Labs  Lab 08/30/19 2021 08/31/19 0004 08/31/19 0418 08/31/19 0800 08/31/19 1157  GLUCAP 98 104* 80 99 96    No results found for this or any previous visit (from the past 240 hour(s)).   Studies: No results found.  Scheduled Meds: . amLODipine  5 mg Oral Daily  . chlorhexidine  15 mL Mouth Rinse BID  . Chlorhexidine Gluconate Cloth  6 each Topical Daily  . clonazePAM  1 mg Per Tube TID  . docusate  100 mg Per Tube BID  . enoxaparin (LOVENOX) injection  40 mg Subcutaneous Q24H  . feeding supplement (PROSource TF)  45 mL Per Tube TID  . free water  200 mL Per Tube Q6H  . labetalol  100 mg Per Tube BID  . magnesium oxide  400 mg Per Tube BID  . mouth rinse  15 mL Mouth Rinse q12n4p  . melatonin  5 mg Per Tube Daily  . oxyCODONE  20 mg Per Tube Q8H  . pantoprazole sodium  40 mg Per Tube Q24H  . polyethylene glycol  17 g Per Tube Daily  . propranolol  20 mg Per Tube TID  . QUEtiapine  100 mg Per Tube BID  . sodium chloride flush  10-40 mL Intracatheter Q12H  . valproic acid  500 mg Per Tube TID   Continuous Infusions: . sodium chloride Stopped (08/21/19 0548)  . feeding supplement (VITAL 1.5 CAL) Stopped (08/31/19 0039)    Active Problems:   Cardiac arrest (HCC)   Hyperkalemia   Acute respiratory failure with hypoxia (HCC)   Acute renal failure with tubular necrosis (HCC)   PEA (Pulseless electrical activity) (HCC)   Acute hypoxemic respiratory failure (HCC)   Acute renal failure (ARF) (HCC)   Status post tracheostomy (HCC)   Tracheostomy tube present (HCC)   Polysubstance abuse (HCC)   Leukocytosis   Acute blood loss anemia   Anoxic brain injury  [facility])   Consultants:  PCCM  Nephrology  Procedures:  ETT placement  R Chest Tube  Trach  R Femoral HD Cath  R [place] CVL  L Radial A-line   Antibiotics: Anti-infectives (From admission, onward)   Start  Dose/Rate Route Frequency Ordered Stop   08/02/19 1400  ceFEPIme (MAXIPIME) 2 g in sodium chloride 0.9 % 100 mL IVPB        2 g 200 mL/hr over 30 Minutes Intravenous Every 8 hours 08/02/19 1232 08/09/19 0615   07/31/19 1000  linezolid (ZYVOX) IVPB 600 mg  Status:  Discontinued        600 mg 300 mL/hr over 60 Minutes Intravenous Every 12 hours 07/31/19 0827 08/02/19 0736   07/31/19 0830  meropenem (MERREM) 1 g in sodium chloride 0.9 % 100 mL IVPB  Status:  Discontinued        1 g 200 mL/hr over 30 Minutes Intravenous Every 8 hours 07/31/19 0827 08/02/19 1232   07/28/19 0800  piperacillin-tazobactam (ZOSYN) IVPB 3.375 g  Status:  Discontinued        3.375 g 12.5 mL/hr over 240 Minutes Intravenous Every 8 hours 07/28/19 0759 07/31/19 0827   07/27/19 1430  linezolid (ZYVOX) IVPB 600 mg  Status:  Discontinued        600 mg 300 mL/hr over 60 Minutes Intravenous Every 12 hours 07/27/19 1418 07/28/19 0749   07/26/19 1630  piperacillin-tazobactam (ZOSYN) IVPB 2.25 g  Status:  Discontinued        2.25 g 100 mL/hr over 30 Minutes Intravenous Every 8 hours 07/26/19 1029 07/28/19 0759   07/26/19 1045  vancomycin (VANCOCIN) IVPB 1000 mg/200 mL premix        1,000 mg 200 mL/hr over 60 Minutes Intravenous  Once 07/26/19 1033 07/26/19 1214   07/26/19 0801  vancomycin variable dose per unstable renal function (pharmacist dosing)  Status:  Discontinued         Does not apply See admin instructions 07/26/19 0801 07/27/19 1418   07/26/19 0700  piperacillin-tazobactam (ZOSYN) IVPB 3.375 g  Status:  Discontinued        3.375 g 100 mL/hr over 30 Minutes Intravenous Every 6 hours 07/26/19 0656 07/26/19 1028   07/25/19 1700  ceFEPIme (MAXIPIME) 2 g in sodium chloride 0.9 % 100 mL IVPB   Status:  Discontinued        2 g 200 mL/hr over 30 Minutes Intravenous Every 24 hours 07/25/19 1642 07/26/19 0650   07/25/19 1645  vancomycin (VANCOREADY) IVPB 1500 mg/300 mL        1,500 mg 150 mL/hr over 120 Minutes Intravenous  Once 07/25/19 1642 07/25/19 1901       Time spent: 20 minutes    Junious Silkllison Sarabeth Benton ANP  [facility] Pager [phone]. If 7PM-7AM, please contact night-coverage at [email or web address] 08/31/2019, 12:03 PM  LOS: 37 days

## 2019-08-31 NOTE — Progress Notes (Signed)
Occupational Therapy Treatment Patient Details Name: George Kelly MRN: [id number] DOB: Feb 22, 1990 Today's Date: 08/31/2019    History of present illness 29 yo male found unresponsive with probable drug overdose.  PEA arrest with ROSC after about 15 minutes.  Found to have lactic acidosis, AKI from rhabdomyolysis, aspiration pneumonia, shock liver, hypothermia.  UDS positive for cocaine, benzo's, THC.   6/17 tracheostomy.   OT comments  Pt more alert during OT session today, but extremely restless, impulsive, and agitated. Pt with inconsistent ability to follow one step commands due to behaviors and high distractibility. OT attempted to redirect pt with simple grooming tasks to maximize ability to independently sequence tasks, but unsuccessful during today's attempts as pt focused on getting out of bed and risking harm to self. In collaboration with nursing, able to return pt to supine and adjust soft restraints. Will re-attempt sessions next week in sequencing and attending to simple ADLs.    Follow Up Recommendations  SNF;LTACH    Equipment Recommendations  Other (comment) (TBD)    Recommendations for Other Services      Precautions / Restrictions Precautions Precautions: Fall Precaution Comments: condom cath, restraints, agitated       Mobility Bed Mobility Overal bed mobility: Needs Assistance             General bed mobility comments: Pt long sitting and attempting to get out of bed/restraints Mod I  Transfers                      Balance                                           ADL either performed or assessed with clinical judgement   ADL Overall ADL's : Needs assistance/impaired     Grooming: Maximal assistance;Wash/dry face;Bed level Grooming Details (indicate cue type and reason): Max A for attempts to wash face, able to reach face but highly distractable and unable to attend to task                                General ADL Comments: Pt with impulsivity, agitation and restlessness along with cognitive deficits impacting ability to attend to tasks or follow commands consistently     Vision       Perception     Praxis      Cognition Arousal/Alertness: Awake/alert Behavior During Therapy: Restless;Agitated;Flat affect;Impulsive Overall Cognitive Status: Impaired/Different from baseline Area of Impairment: Attention;Following commands;Safety/judgement;Awareness;Problem solving                   Current Attention Level: Sustained   Following Commands: Follows one step commands inconsistently Safety/Judgement: Decreased awareness of safety;Decreased awareness of deficits Awareness: Intellectual Problem Solving: Requires verbal cues;Requires tactile cues;Slow processing;Difficulty sequencing General Comments: Pt with trach removed, able to vocalize. appears to be able to understand what you are saying but cognitvely does not understand his situation. increasing agitation and attempts to get out of bed        Exercises     Shoulder Instructions       General Comments      Pertinent Vitals/ Pain       Pain Assessment: Faces Faces Pain Scale: No hurt  Home Living  Prior Functioning/Environment              Frequency  Min 2X/week        Progress Toward Goals  OT Goals(current goals can now be found in the care plan section)  Progress towards OT goals: Progressing toward goals  Acute Rehab OT Goals Patient Stated Goal: get out of here OT Goal Formulation: Patient unable to participate in goal setting Potential to Achieve Goals: Fair ADL Goals Pt Will Perform Grooming: with min assist;sitting Pt/caregiver will Perform Home Exercise Program: Increased strength;Increased ROM;Both right and left upper extremity;With minimal assist;With written HEP provided Additional ADL Goal #1: Pt will tolerate sitting  EOB >7 min with minA as precursor to EOB/OOB ADL. Additional ADL Goal #2: Pt will follow 1 step simple command with 75% accuracy during ADL/functional task.  Plan Discharge plan remains appropriate    Co-evaluation                 AM-PAC OT "6 Clicks" Daily Activity     Outcome Measure   Help from another person eating meals?: A Little Help from another person taking care of personal grooming?: A Lot Help from another person toileting, which includes using toliet, bedpan, or urinal?: Total Help from another person bathing (including washing, rinsing, drying)?: Total Help from another person to put on and taking off regular upper body clothing?: Total Help from another person to put on and taking off regular lower body clothing?: Total 6 Click Score: 9    End of Session    OT Visit Diagnosis: Other abnormalities of gait and mobility (R26.89);Muscle weakness (generalized) (M62.81);Other symptoms and signs involving cognitive function   Activity Tolerance Treatment limited secondary to agitation   Patient Left in bed;with call bell/phone within reach;with nursing/sitter in room;with restraints reapplied   Nurse Communication Mobility status        Time: 1347-1405 OT Time Calculation (min): 18 min  Charges: OT General Charges $OT Visit: 1 Visit OT Treatments $Therapeutic Activity: 8-22 mins  Lorre Munroe, OTR/L   Lorre Munroe 08/31/2019, 2:14 PM

## 2019-09-01 NOTE — Progress Notes (Signed)
George Kelly site has a foul smell with thick yellow purilent drainage. Dressing changed, site cleansed.  Pt tolerated dressing change well.    MD notified new orders to send for culture.

## 2019-09-02 LAB — GLUCOSE, CAPILLARY
Glucose-Capillary: 101 mg/dL — ABNORMAL HIGH (ref 70–99)
Glucose-Capillary: 78 mg/dL (ref 70–99)
Glucose-Capillary: 81 mg/dL (ref 70–99)
Glucose-Capillary: 78 mg/dL (ref 70–99)

## 2019-09-02 NOTE — Progress Notes (Signed)
Pt very restless overnight, multiple attempts to remove restraints with teeth. Pt appeared to be having hallucinations mouthing words to someone not present in the room. Pt received 1mg  ativan twice in 12 hr shift.

## 2019-09-03 LAB — AEROBIC CULTURE W GRAM STAIN (SUPERFICIAL SPECIMEN)

## 2019-09-03 LAB — CBC WITH DIFFERENTIAL/PLATELET
Abs Immature Granulocytes: 0.02 10*3/uL (ref 0.00–0.07)
Basophils Absolute: 0.1 10*3/uL (ref 0.0–0.1)
Basophils Relative: 1 %
Eosinophils Absolute: 1.8 10*3/uL — ABNORMAL HIGH (ref 0.0–0.5)
Eosinophils Relative: 16 %
HCT: 32.1 % — ABNORMAL LOW (ref 39.0–52.0)
Hemoglobin: 10.7 g/dL — ABNORMAL LOW (ref 13.0–17.0)
Immature Granulocytes: 0 %
Lymphocytes Relative: 22 %
Lymphs Abs: 2.6 10*3/uL (ref 0.7–4.0)
MCH: 27.9 pg (ref 26.0–34.0)
MCHC: 33.3 g/dL (ref 30.0–36.0)
MCV: 83.8 fL (ref 80.0–100.0)
Monocytes Absolute: 0.9 10*3/uL (ref 0.1–1.0)
Monocytes Relative: 7 %
Neutro Abs: 6.2 10*3/uL (ref 1.7–7.7)
Neutrophils Relative %: 54 %
Platelets: 261 10*3/uL (ref 150–400)
RBC: 3.83 MIL/uL — ABNORMAL LOW (ref 4.22–5.81)
RDW: 14 % (ref 11.5–15.5)
WBC: 11.5 10*3/uL — ABNORMAL HIGH (ref 4.0–10.5)
nRBC: 0 % (ref 0.0–0.2)

## 2019-09-03 LAB — GLUCOSE, CAPILLARY: Glucose-Capillary: <10 mg/dL — CL (ref 70–99)

## 2019-09-03 MED ORDER — QUETIAPINE FUMARATE 100 MG PO TABS
200.0000 mg | ORAL_TABLET | Freq: Two times a day (BID) | ORAL | Status: DC
  Administered 2019-09-03 – 2019-09-07 (×9): 200 mg via ORAL
  Filled 2019-09-03 (×9): qty 2

## 2019-09-03 MED ORDER — CLONAZEPAM 0.5 MG PO TABS
0.5000 mg | ORAL_TABLET | Freq: Three times a day (TID) | ORAL | Status: DC
  Administered 2019-09-03 – 2019-09-06 (×9): 0.5 mg via ORAL
  Filled 2019-09-03 (×9): qty 1

## 2019-09-03 MED ORDER — OXYCODONE HCL 5 MG PO TABS
10.0000 mg | ORAL_TABLET | Freq: Three times a day (TID) | ORAL | Status: DC
  Administered 2019-09-03 – 2019-09-07 (×11): 10 mg via ORAL
  Filled 2019-09-03 (×12): qty 2

## 2019-09-03 NOTE — Progress Notes (Addendum)
[facility] PROGRESS NOTE  George Kelly VEL:[id number] DOB: January 14, 1991 DOA: 07/25/2019 PCP: Patient, No Pcp Per  Status: Inpatient---Remains inpatient appropriate because:Altered mental status, Unsafe d/c plan and Inpatient level of care appropriate due to severity of illness   Dispo: The patient is from: Home              Anticipated d/c is to: SNF              Anticipated d/c date is: > 3 days              Patient currently is not medically stable to d/c.  Patient continues to require one-on-one supervision in restraints because of sequelae from anoxic brain injury.  He is progressing slowly with PT and OT. SLP slowly advancing diet. Mother applying for guardianship and Medicaid. Unable to receive additional care at [facility] due to lack of caregiver support after discharge therefore plan is to discharge to skilled nursing facility  Code Status: Full Family Communication: 7/15 updated patient's mother regarding improved status and removal of tracheostomy tube.  Other family at bedside on 7/16 DVT prophylaxis: Lovenox  HPI: 29 yo male found unresponsive with probable drug overdose. PEA arrest with ROSC after about 15 minutes. Found to have lactic acidosis, AKI from rhabdomyolysis, aspiration pneumonia, shock liver, hypothermia. UDS positive for cocaine, benzo's, THC. Weaned off of precedex and weaning some of his sedative and psychotropic medications prior to transfer from unit.  Patient has been slowly progressing regards to improving mentation and improved respiratory status.  He was decannulated on 7/15 and is stable on room air.   Subjective: Alert, sitting on side of bed after having managed to get out of his restraints.  Attempting to ambulate independently to obtain cup of applesauce sitting on the COW.  Nursing staff made aware of come to replace patient back in bed.  Objective: Vitals:   09/02/19 2003 09/03/19 0839  BP: 126/81 129/82  Pulse: 80 77  Resp: 14   Temp: 98 F  (36.7 C) 97.9 F (36.6 C)  SpO2: 99% 98%   No intake or output data in the 24 hours ending 09/03/19 1249 Filed Weights   08/31/19 0500 09/01/19 0500 09/03/19 0500  Weight: 68 kg 68 kg 74 kg    Exam: Constitutional: NAD, alert but restless and impulsive, remains confused Neck: normal, supple, no masses, tracheal stoma site with opening with thick purulent yellow drainage noted.  Patient had remove dressing. Respiratory: clear to auscultation bilaterally, Normal respiratory effort. No accessory muscle use.  Trach collar 28% FiO2. Since my initial evaluation patient has been decannulated and maintaining O2 sats of 100% on room air Cardiovascular: Regular rate and rhythm, no murmurs / rubs / gallops. No extremity edema. 2+ pedal pulses.  Abdomen: no tenderness, no masses palpated. No hepatosplenomegaly. Bowel sounds positive.  Genitourinary: Condom catheter in place with clear urine noted Musculoskeletal: no clubbing / cyanosis. No joint deformity upper and lower extremities. Good ROM, no contractures. Normal muscle tone.  Skin: no rashes, lesions, ulcers. No induration Neurologic: CN 2-12 grossly intact. Sensation intact, DTR not assessed.  Strength 5/5 x all 4 extremities.  Psychiatric: Alert and confused; oriented times name only.  Surprised to learn that he was in the hospital.  Was able to relate that it takes him "too long" but up without help and he was to discontinue condom catheter   Assessment/Plan: Acute on chronic hypoxic Respiratory Failure requiring tracheostomy tube Out of hospital cardiac Arrest Aspiration Pneumonia/Enterobacter PNA -  And tolerating tracheostomy collar 7/2 and was decannulated on 7/15 -Stoma remains minimally open with purulent yellow drainage noted - SLP following for language retraining -Alert and attempting to speak, following some commands -Stoma culture drainage obtained on 7/17 consistent with a few MRSA.  Patient without any significant symptoms: he  is afebrile,WBC downward  trend to 11.8 as of 7/14.  Will obtain CBC.  Suspect colonization as opposed to active infection so will not start antibiotics at this juncture  Dysphagia -PT evaluation today was able to tolerate some water and applesauce but still ready to have feeding tube removed -Advanced to D2 diet w/ nectar thick liquids with SLP following frequently-hopeful to advance to more solid/filling diet noting patient reporting extreme hunger despite eating 100% of trays and additional snacks provided in between meal  Nutrition Status: Nutrition Problem: Inadequate oral intake Etiology: acute illness Signs/Symptoms: recent NPO Interventions: Tube feeding to supplement Dysphagia 2 w/ nectar thick liquids Estimated body mass index is 21.52 kg/m as calculated from the following:   Height as of this encounter: 6\' 1"  (1.854 m).   Weight as of this encounter: 74 kg.  Fever -Resolved -Suspect related to atelectasis   Acute metabolic encephalopathy 2nd to anoxic brain injury, sepsis  Hx of substance abuse -Patient currently without withdrawal symptoms and has been continued on appropriate medications during the hospitalization -7/15 it was noted that patient performed better with PT/OT/SLP prior to receiving sedative medications therefore began titrating offending medications: -Continue Ativan 1 mg as needed for breakthrough agitation with plans to eventually discontinue in favor of Klonopin to be used as primary prn agent once fully weaned -7/19 decrease scheduled Klonopin from 1 mg TID to 0.5 mg TID if tolerates for the next 3 days plan to decrease to twice daily x 3 days, then once daily x3 days then provide as as needed dosing and discontinue Ativan -7/19 decrease scheduled oxycodone 20 mg every 8 hours to 10 mg every 8 hours -Continue 200 mg BID-tolerates above changes plan to decrease to 100 mg twice daily by the end of the week -Continue propranolol and taper as  recommended -High risk for fall; when awake very impulsive and becomes agitated especially in the evening therefore will need to continue 1: 1 sitter   Hypertension -Continue amlodipine with goal SBP <150 -Continue labetalol and consider tapering as tolerated-current SBP well controlled although heart rate averages 70s to 90s -Continue IV propranolol prn  Anemia of critical illness -Hgb stable at 10.9-would transfuse for <7.   Physical deconditioning -PT and OT following with recommendation for LTAC versus SNF -Does not have appropriate 24-hour care after discharge therefore declined by CIR -7/19: Alert but remained confused during entirety of session.  Able to roll side to side and get to edge of bed with minimal assist x1 and was able to maintain midline sitting with minimal guard x2 for safety.  Occasional posterior lean detected especially when he was distracted.  Mobility significantly improved as well as gait training.  He was able to ambulate 100 feet in the hallway with minimal assist x2 with hand-held assistive device but was very unsteady due to scissoring pattern and impaired balance and coordination.  Hypomagnesemia -Magnesium 1.8 after replacement    Data Reviewed: Basic Metabolic Panel: Recent Labs  Lab 08/28/19 0710 08/29/19 0947  NA 136 140  K 3.8 4.0  CL 96* 99  CO2 30 31  GLUCOSE 112* 111*  BUN 13 13  CREATININE 0.79 0.90  CALCIUM 9.4 9.7  MG 1.7 1.8  PHOS 6.1*  --    Liver Function Tests: Recent Labs  Lab 08/28/19 0710 08/29/19 0947  AST  --  25  ALT  --  50*  ALKPHOS  --  162*  BILITOT  --  0.6  PROT  --  9.2*  ALBUMIN 2.3* 2.6*   No results for input(s): LIPASE, AMYLASE in the last 168 hours. No results for input(s): AMMONIA in the last 168 hours. CBC: Recent Labs  Lab 08/28/19 0710 08/29/19 0947  WBC 13.0* 11.8*  NEUTROABS 8.8* 7.2  HGB 10.6* 10.9*  HCT 32.3* 33.7*  MCV 85.4 85.3  PLT 303 331   Cardiac Enzymes: No results for  input(s): CKTOTAL, CKMB, CKMBINDEX, TROPONINI in the last 168 hours. BNP (last 3 results) No results for input(s): BNP in the last 8760 hours.  ProBNP (last 3 results) No results for input(s): PROBNP in the last 8760 hours.  CBG: Recent Labs  Lab 08/31/19 1641 09/02/19 0915 09/02/19 1336 09/02/19 1744 09/02/19 2032  GLUCAP 99 101* 78 81 78    Recent Results (from the past 240 hour(s))  Aerobic Culture (superficial specimen)     Status: None   Collection Time: 09/01/19  6:10 PM   Specimen: Wound; Tracheal  Result Value Ref Range Status   Specimen Description WOUND  Final   Special Requests TRACHEAL SITE  Final   Gram Stain   Final    RARE WBC PRESENT, PREDOMINANTLY PMN FEW GRAM POSITIVE COCCI Performed at [facility], [address] [place], [place] [address]    Culture FEW METHICILLIN RESISTANT STAPHYLOCOCCUS AUREUS  Final   Report Status 09/03/2019 FINAL  Final   Organism ID, Bacteria METHICILLIN RESISTANT STAPHYLOCOCCUS AUREUS  Final      Susceptibility   Methicillin resistant staphylococcus aureus - MIC*    CIPROFLOXACIN >=8 RESISTANT Resistant     ERYTHROMYCIN >=8 RESISTANT Resistant     GENTAMICIN <=0.5 SENSITIVE Sensitive     OXACILLIN >=4 RESISTANT Resistant     TETRACYCLINE <=1 SENSITIVE Sensitive     VANCOMYCIN <=0.5 SENSITIVE Sensitive     TRIMETH/SULFA 160 RESISTANT Resistant     CLINDAMYCIN <=0.25 SENSITIVE Sensitive     RIFAMPIN <=0.5 SENSITIVE Sensitive     Inducible Clindamycin NEGATIVE Sensitive     * FEW METHICILLIN RESISTANT STAPHYLOCOCCUS AUREUS     Studies: No results found.  Scheduled Meds: . amLODipine  5 mg Oral Daily  . chlorhexidine  15 mL Mouth Rinse BID  . Chlorhexidine Gluconate Cloth  6 each Topical Daily  . clonazePAM  1 mg Oral TID  . docusate  100 mg Oral BID  . enoxaparin (LOVENOX) injection  40 mg Subcutaneous Q24H  . labetalol  100 mg Oral BID  . magnesium oxide  400 mg Oral BID  . mouth rinse  15 mL Mouth Rinse  q12n4p  . melatonin  5 mg Oral Daily  . oxyCODONE  20 mg Oral Q8H  . pantoprazole sodium  40 mg Oral Q24H  . polyethylene glycol  17 g Oral Daily  . propranolol  20 mg Oral TID  . QUEtiapine  200 mg Oral BID  . sodium chloride flush  10-40 mL Intracatheter Q12H  . valproic acid  500 mg Oral TID   Continuous Infusions: . sodium chloride Stopped (08/21/19 0548)    Active Problems:   Cardiac arrest (HCC)   Hyperkalemia   Acute respiratory failure with hypoxia (HCC)   Acute renal failure with tubular  necrosis (HCC)   PEA (Pulseless electrical activity) (HCC)   Acute hypoxemic respiratory failure (HCC)   Acute renal failure (ARF) (HCC)   Status post tracheostomy (HCC)   Tracheostomy tube present (HCC)   Polysubstance abuse (HCC)   Leukocytosis   Acute blood loss anemia   Anoxic brain injury (HCC)   Dysphagia causing pulmonary aspiration with swallowing   Consultants:  PCCM  Nephrology  Procedures:  ETT placement  R Chest Tube  Trach  R Femoral HD Cath  R [place] CVL  L Radial A-line   Antibiotics: Anti-infectives (From admission, onward)   Start     Dose/Rate Route Frequency Ordered Stop   08/02/19 1400  ceFEPIme (MAXIPIME) 2 g in sodium chloride 0.9 % 100 mL IVPB        2 g 200 mL/hr over 30 Minutes Intravenous Every 8 hours 08/02/19 1232 08/09/19 0615   07/31/19 1000  linezolid (ZYVOX) IVPB 600 mg  Status:  Discontinued        600 mg 300 mL/hr over 60 Minutes Intravenous Every 12 hours 07/31/19 0827 08/02/19 0736   07/31/19 0830  meropenem (MERREM) 1 g in sodium chloride 0.9 % 100 mL IVPB  Status:  Discontinued        1 g 200 mL/hr over 30 Minutes Intravenous Every 8 hours 07/31/19 0827 08/02/19 1232   07/28/19 0800  piperacillin-tazobactam (ZOSYN) IVPB 3.375 g  Status:  Discontinued        3.375 g 12.5 mL/hr over 240 Minutes Intravenous Every 8 hours 07/28/19 0759 07/31/19 0827   07/27/19 1430  linezolid (ZYVOX) IVPB 600 mg  Status:  Discontinued        600  mg 300 mL/hr over 60 Minutes Intravenous Every 12 hours 07/27/19 1418 07/28/19 0749   07/26/19 1630  piperacillin-tazobactam (ZOSYN) IVPB 2.25 g  Status:  Discontinued        2.25 g 100 mL/hr over 30 Minutes Intravenous Every 8 hours 07/26/19 1029 07/28/19 0759   07/26/19 1045  vancomycin (VANCOCIN) IVPB 1000 mg/200 mL premix        1,000 mg 200 mL/hr over 60 Minutes Intravenous  Once 07/26/19 1033 07/26/19 1214   07/26/19 0801  vancomycin variable dose per unstable renal function (pharmacist dosing)  Status:  Discontinued         Does not apply See admin instructions 07/26/19 0801 07/27/19 1418   07/26/19 0700  piperacillin-tazobactam (ZOSYN) IVPB 3.375 g  Status:  Discontinued        3.375 g 100 mL/hr over 30 Minutes Intravenous Every 6 hours 07/26/19 0656 07/26/19 1028   07/25/19 1700  ceFEPIme (MAXIPIME) 2 g in sodium chloride 0.9 % 100 mL IVPB  Status:  Discontinued        2 g 200 mL/hr over 30 Minutes Intravenous Every 24 hours 07/25/19 1642 07/26/19 0650   07/25/19 1645  vancomycin (VANCOREADY) IVPB 1500 mg/300 mL        1,500 mg 150 mL/hr over 120 Minutes Intravenous  Once 07/25/19 1642 07/25/19 1901       Time spent: 20 minutes    Junious Silkllison Areli Frary ANP  [facility] Pager [phone]. If 7PM-7AM, please contact night-coverage at [email or web address] 09/03/2019, 12:49 PM  LOS: 40 days

## 2019-09-03 NOTE — Progress Notes (Signed)
PROGRESS NOTE    George Kelly  QQI:[id number] DOB: 1990/11/22 DOA: 07/25/2019 PCP: Patient, No Pcp Per    Brief Narrative: 29 yo male found unresponsive with probable drug overdose. PEA arrest with ROSC after about 15 minutes. Found to have lactic acidosis, AKI from rhabdomyolysis, aspiration pneumonia, shock liver, hypothermia. UDS positive for cocaine, benzo's, THC. Weaned off of precedex and weaning some of his sedative and psychotropic medications prior to transfer from unit.  7/13:CXR results noted. Nursing reports that secretion are ok and he is clearing what he has. Labs are stable. If fevers return, consider initiation of abx 7/15: Decannulated.  Assessment & Plan:   Active Problems:   Cardiac arrest (HCC)   Hyperkalemia   Acute respiratory failure with hypoxia (HCC)   Acute renal failure with tubular necrosis (HCC)   PEA (Pulseless electrical activity) (HCC)   Acute hypoxemic respiratory failure (HCC)   Acute renal failure (ARF) (HCC)   Status post tracheostomy (HCC)   Tracheostomy tube present (HCC)   Polysubstance abuse (HCC)   Leukocytosis   Acute blood loss anemia   Anoxic brain injury (HCC)   Dysphagia causing pulmonary aspiration with swallowing   Acute metabolic encephalopathy 2nd to anoxic brain injury, sepsis  Hx of substance abuse-continue to titrate sedative medications as tolerated.  Remains on Ativan and Klonopin and Seroquel and oxycodone.  Hypertension on amlodipine and labetalol blood pressure 106/64.  Continue and taper as tolerated.  Acute on chronic hypoxic Respiratory Failure requiring tracheostomy tube Out of hospital cardiac Arrest Aspiration Pneumonia/Enterobacter PNA-decannulated 08/30/2019.  Stable on room air. Nursing reported take yellow foul-smelling drainage from the trach site which has been sent for culture with few Staph aureus.  Dysphagia continue dysphagia 2 diet.  Nutrition Problem: Inadequate oral intake Etiology:  acute illness  Signs/Symptoms: NPO status  Interventions: Tube feeding  Estimated body mass index is 21.52 kg/m as calculated from the following:   Height as of this encounter: 6\' 1"  (1.854 m).   Weight as of this encounter: 74 kg.  DVT prophylaxis: Lovenox Code Status: Full code Family Communication: None at bedside Disposition Plan:  Status is: Inpatient  Dispo: The patient is from: Home              Anticipated d/c is to: SNF              Anticipated d/c date is: > 3 days              Patient currently is medically stable to d/c.   Consultants: PCCM and nephrology   Procedures:ETT placement  R Chest Tube  Trach  R Femoral HD Cath  R [place] CVL  L Radial A-line   Antimicrobials: None Subjective: Patient resting in bed in no acute distress  Objective: Vitals:   09/02/19 1707 09/02/19 2003 09/03/19 0500 09/03/19 0839  BP: (!) 141/89 126/81  129/82  Pulse: 94 80  77  Resp: 16 14    Temp: 98.2 F (36.8 C) 98 F (36.7 C)  97.9 F (36.6 C)  TempSrc: Oral Oral    SpO2: 96% 99%  98%  Weight:   74 kg   Height:       No intake or output data in the 24 hours ending 09/03/19 0928 Filed Weights   08/31/19 0500 09/01/19 0500 09/03/19 0500  Weight: 68 kg 68 kg 74 kg    Examination:  General exam: Appears calm and comfortable  Respiratory system: Clear to auscultation. Respiratory effort normal.  09/05/19  site covered with dressing. Cardiovascular system: S1 & S2 heard, RRR. No JVD, murmurs, rubs, gallops or clicks. No pedal edema. Gastrointestinal system: Abdomen is nondistended, soft and nontender. No organomegaly or masses felt. Normal bowel sounds heard. Central nervous system: Alert and oriented. No focal neurological deficits. Extremities: Symmetric 5 x 5 power. Skin: No rashes, lesions or ulcers Psychiatry: Confused  Data Reviewed: I have personally reviewed following labs and imaging studies  CBC: Recent Labs  Lab 08/27/19 1144 08/28/19 0710  08/29/19 0947  WBC 13.4* 13.0* 11.8*  NEUTROABS 8.2* 8.8* 7.2  HGB 10.5* 10.6* 10.9*  HCT 31.9* 32.3* 33.7*  MCV 85.5 85.4 85.3  PLT 327 303 331   Basic Metabolic Panel: Recent Labs  Lab 08/27/19 1144 08/28/19 0710 08/29/19 0947  NA 139 136 140  K 4.4 3.8 4.0  CL 98 96* 99  CO2 27 30 31   GLUCOSE 99 112* 111*  BUN 13 13 13   CREATININE 0.97 0.79 0.90  CALCIUM 9.4 9.4 9.7  MG 1.8 1.7 1.8  PHOS 6.4* 6.1*  --    GFR: Estimated Creatinine Clearance: 127.9 mL/min (by C-G formula based on SCr of 0.9 mg/dL). Liver Function Tests: Recent Labs  Lab 08/27/19 1144 08/28/19 0710 08/29/19 0947  AST  --   --  25  ALT  --   --  50*  ALKPHOS  --   --  162*  BILITOT  --   --  0.6  PROT  --   --  9.2*  ALBUMIN 2.5* 2.3* 2.6*   No results for input(s): LIPASE, AMYLASE in the last 168 hours. No results for input(s): AMMONIA in the last 168 hours. Coagulation Profile: No results for input(s): INR, PROTIME in the last 168 hours. Cardiac Enzymes: No results for input(s): CKTOTAL, CKMB, CKMBINDEX, TROPONINI in the last 168 hours. BNP (last 3 results) No results for input(s): PROBNP in the last 8760 hours. HbA1C: No results for input(s): HGBA1C in the last 72 hours. CBG: Recent Labs  Lab 08/31/19 1641 09/02/19 0915 09/02/19 1336 09/02/19 1744 09/02/19 2032  GLUCAP 99 101* 78 81 78   Lipid Profile: No results for input(s): CHOL, HDL, LDLCALC, TRIG, CHOLHDL, LDLDIRECT in the last 72 hours. Thyroid Function Tests: No results for input(s): TSH, T4TOTAL, FREET4, T3FREE, THYROIDAB in the last 72 hours. Anemia Panel: No results for input(s): VITAMINB12, FOLATE, FERRITIN, TIBC, IRON, RETICCTPCT in the last 72 hours. Sepsis Labs: No results for input(s): PROCALCITON, LATICACIDVEN in the last 168 hours.  Recent Results (from the past 240 hour(s))  Aerobic Culture (superficial specimen)     Status: None (Preliminary result)   Collection Time: 09/01/19  6:10 PM   Specimen: Wound;  Tracheal  Result Value Ref Range Status   Specimen Description WOUND  Final   Special Requests TRACHEAL SITE  Final   Gram Stain   Final    RARE WBC PRESENT, PREDOMINANTLY PMN FEW GRAM POSITIVE COCCI Performed at [facility], [address], [place], [address] [place]    Culture FEW STAPHYLOCOCCUS AUREUS  Final   Report Status PENDING  Incomplete         Radiology Studies: No results found.      Scheduled Meds: . amLODipine  5 mg Oral Daily  . chlorhexidine  15 mL Mouth Rinse BID  . Chlorhexidine Gluconate Cloth  6 each Topical Daily  . clonazePAM  1 mg Oral TID  . docusate  100 mg Oral BID  . enoxaparin (LOVENOX) injection  40 mg Subcutaneous Q24H  . labetalol  100 mg Oral BID  . magnesium oxide  400 mg Oral BID  . mouth rinse  15 mL Mouth Rinse q12n4p  . melatonin  5 mg Oral Daily  . oxyCODONE  20 mg Oral Q8H  . pantoprazole sodium  40 mg Oral Q24H  . polyethylene glycol  17 g Oral Daily  . propranolol  20 mg Oral TID  . QUEtiapine  200 mg Oral BID  . sodium chloride flush  10-40 mL Intracatheter Q12H  . valproic acid  500 mg Oral TID   Continuous Infusions: . sodium chloride Stopped (08/21/19 0548)     LOS: 40 days     Alwyn Ren, MD 09/03/2019, 9:28 AM

## 2019-09-03 NOTE — Progress Notes (Signed)
Physical Therapy Treatment Patient Details Name: George Kelly MRN: [id number] DOB: 09/01/1990 Today's Date: 09/03/2019    History of Present Illness 29 yo male found unresponsive with probable drug overdose.  PEA arrest with ROSC after about 15 minutes.  Found to have lactic acidosis, AKI from rhabdomyolysis, aspiration pneumonia, shock liver, hypothermia.  UDS positive for cocaine, benzo's, THC.   6/17 tracheostomy.    PT Comments    Patient received in bed, very alert and awake today but very confused- asks what happened to his neck (at trach decannulation site) and thinking that he is either in jail or at home, very surprised to learn he is in the hospital. Perseverating on eating, applesauce, and peaches as well as finding his iPhone. Able to roll side to side and get to EOB with MinAx1 and maintain midline sitting with min guardx2 for safety with occasional posterior lean especially when distracted. Able to progress mobility significantly today and gait trained approximately 167f in hallway with HHA and MinAx2 but very unsteady due to scissoring pattern and impaired balance/coordination. Left in bed with restraints reapplied and all needs met this morning, bed alarm active and RN aware of patient status.    Follow Up Recommendations  SNF;Supervision/Assistance - 24 hour     Equipment Recommendations  Other (comment) (defer to next venue)    Recommendations for Other Services       Precautions / Restrictions Precautions Precautions: Fall Precaution Comments: condom cath, restraints, agitated, impulsive Restrictions Weight Bearing Restrictions: No    Mobility  Bed Mobility Overal bed mobility: Needs Assistance Bed Mobility: Supine to Sit;Sit to Supine Rolling: Min assist   Supine to sit: Min assist Sit to supine: Min assist;+2 for physical assistance   General bed mobility comments: MinA for rolling and for getting to EOB, did need MinAx2 for back to bed due to poor  cognition/safety and motor planning  Transfers Overall transfer level: Needs assistance Equipment used: 2 person hand held assist Transfers: Sit to/from Stand Sit to Stand: Min assist;+2 physical assistance         General transfer comment: MinAx2 to boost to full standing position, narrow BOS and unsteady with poor safety awareness  Ambulation/Gait Ambulation/Gait assistance: Min assist;+2 physical assistance Gait Distance (Feet): 100 Feet Assistive device: 2 person hand held assist Gait Pattern/deviations: Step-through pattern;Scissoring;Drifts right/left;Narrow base of support Gait velocity: decreased   General Gait Details: very narrow BOS and often scissoring but able to maintain upright and ambulate in hallway with MinAx2 for safety and balance, able to maintain conversation without significant SOB   Stairs             Wheelchair Mobility    Modified Rankin (Stroke Patients Only)       Balance Overall balance assessment: Needs assistance Sitting-balance support: Bilateral upper extremity supported;Feet supported Sitting balance-Leahy Scale: Fair Sitting balance - Comments: occasional posterior lean especially when distracted Postural control: Posterior lean Standing balance support: No upper extremity supported;During functional activity Standing balance-Leahy Scale: Poor Standing balance comment: MinAx2 to maintain balance                            Cognition Arousal/Alertness: Awake/alert Behavior During Therapy: Restless;Impulsive Overall Cognitive Status: Impaired/Different from baseline Area of Impairment: Attention;Following commands;Safety/judgement;Awareness;Problem solving                   Current Attention Level: Sustained   Following Commands: Follows one step commands inconsistently Safety/Judgement: Decreased  awareness of safety;Decreased awareness of deficits Awareness: Intellectual Problem Solving: Requires verbal  cues;Requires tactile cues;Slow processing;Difficulty sequencing General Comments: decannulated and able to vocalize but very much unaware of situation- states "my bond was 80K then 20K so how am I still in jail?" and "how did my apartment get turned into the hospital?". Perseverating on finding his cell phone and on eating peaches. Very poor safety awareness.      Exercises      General Comments        Pertinent Vitals/Pain Pain Assessment: Faces Pain Score: 0-No pain Faces Pain Scale: No hurt Pain Intervention(s): Limited activity within patient's tolerance;Monitored during session    Home Living                      Prior Function            PT Goals (current goals can now be found in the care plan section) Acute Rehab PT Goals Patient Stated Goal: get out of here PT Goal Formulation: With patient Time For Goal Achievement: 09/06/19 Potential to Achieve Goals: Fair Progress towards PT goals: Progressing toward goals    Frequency    Min 2X/week      PT Plan Current plan remains appropriate    Co-evaluation              AM-PAC PT "6 Clicks" Mobility   Outcome Measure  Help needed turning from your back to your side while in a flat bed without using bedrails?: A Little Help needed moving from lying on your back to sitting on the side of a flat bed without using bedrails?: A Little Help needed moving to and from a bed to a chair (including a wheelchair)?: A Little Help needed standing up from a chair using your arms (e.g., wheelchair or bedside chair)?: A Lot Help needed to walk in hospital room?: A Lot Help needed climbing 3-5 steps with a railing? : A Lot 6 Click Score: 15    End of Session   Activity Tolerance: Patient tolerated treatment well Patient left: in bed;with call bell/phone within reach;with restraints reapplied;with bed alarm set Nurse Communication: Mobility status PT Visit Diagnosis: Muscle weakness (generalized)  (M62.81);Difficulty in walking, not elsewhere classified (R26.2);Other abnormalities of gait and mobility (R26.89)     Time: 1000-1039 PT Time Calculation (min) (ACUTE ONLY): 39 min  Charges:  $Gait Training: 8-22 mins $Therapeutic Activity: 23-37 mins                     Windell Norfolk, DPT, PN1   Supplemental Physical Therapist [place]    Pager [phone] Acute Rehab Office [phone]

## 2019-09-03 NOTE — Progress Notes (Signed)
  Speech Language Pathology Treatment: Dysphagia  Patient Details Name: George Kelly MRN: [id number] DOB: 08/14/1990 Today's Date: 09/03/2019 Time: 0623-7628 SLP Time Calculation (min) (ACUTE ONLY): 17 min  Assessment / Plan / Recommendation Clinical Impression  Pt able to adequately wake after receiving morning meds and safe to upgrade diet texture to Dys 3 (chopped meats). Continuous straw sips thin resulting in cough related to thin likely impacted by patent stoma and inadequate pressures needed to fully protect airway. Oral cavity cleared after cracker. SLP will upgrade to Dys 3, continue nectar, full supervision and assist due to cognitive impairments. Will continue to follow for safety with po's and upgrade liquids when able.    HPI HPI: 29 yo male found unresponsive with probable drug overdose.  PEA arrest with ROSC after about 15 minutes.  Found to have lactic acidosis, AKI from rhabdomyolysis, aspiration pneumonia, shock liver, hypothermia, acute metabolic encephalopathy 2nd to anoxia, sepsis.  UDS positive for cocaine, benzo's, THC.   ETT 6/09-6/21; 6/21 tracheostomy. Has been tolerating trach collar since 7/2; has cortrak.      SLP Plan  Continue with current plan of care       Recommendations  Diet recommendations: Dysphagia 3 (mechanical soft);Nectar-thick liquid Liquids provided via: Cup;Straw Medication Administration: Whole meds with puree Supervision: Staff to assist with self feeding;Full supervision/cueing for compensatory strategies Compensations: Slow rate;Minimize environmental distractions;Small sips/bites;Lingual sweep for clearance of pocketing Postural Changes and/or Swallow Maneuvers: Seated upright 90 degrees                Oral Care Recommendations: Oral care BID SLP Visit Diagnosis: Dysphagia, unspecified (R13.10) Plan: Continue with current plan of care       O                [name] 09/03/2019, 1:02 PM  [name] Arpi Diebold  M.Ed Nurse, children's [phone] Office [phone]

## 2019-09-04 DIAGNOSIS — R410 Disorientation, unspecified: Secondary | ICD-10-CM

## 2019-09-04 NOTE — Progress Notes (Signed)
Nutrition Follow-up  DOCUMENTATION CODES:   Not applicable  INTERVENTION:    Hormel Shake BID between meals, each supplement provides 520 kcals and 22 grams of protein  Magic cup BID with meals, each supplement provides 290 kcal and 9 grams of protein  MVI daily   NUTRITION DIAGNOSIS:   Inadequate oral intake related to acute illness as evidenced by NPO status.  Diet advanced   GOAL:   Patient will meet greater than or equal to 90% of their needs  Progressing   MONITOR:   Vent status, Labs, Weight trends, TF tolerance  REASON FOR ASSESSMENT:   Consult, Ventilator Enteral/tube feeding initiation and management  ASSESSMENT:   29 yo male found unresponsive with possible drug OD, admitted post cardiac arrest to ICU on vent with severe shock, AKI with hyperkalemia and acidosis. PMH includes polysubstance abuse.  6/09 CRRT initiated 6/10 CRRT discontinued 6/14 CT abdomen negative for acute process 6/17 Trach placement  6/18 Cortrak placed 7/02 Cortrak replaced 7/15 Decannulated, Diet advanced DYS 1, nectar liquids 7/16 Diet advanced DYS 2, nectar liquids 7/17 TF stopped, Cortrak removed    Pt remains altered. Unable to provide history. Diet advanced to DYS 3 with nectar thick liquids yesterday. Appetite progressing per nursing. Last meal completion charted 100%. RD to provide supplementation to maximize kcal and protein this admission.   Mother applying for guardianship and Medicaid. Plan d/c to SNF.   Admission weight: 86.9 kg  Current weight: 74 kg (up 6 kg over the last two days)  Medications: colace, mag-ox, miralax Labs: CBG 88-105  Diet Order:   Diet Order            DIET DYS 3 Room service appropriate? No; Fluid consistency: Nectar Thick  Diet effective now                 EDUCATION NEEDS:   Not appropriate for education at this time  Skin:  Skin Assessment: Skin Integrity Issues: Skin Integrity Issues:: Other (Comment) Other: MASD- groin,  buttocks  Last BM:  7/18  Height:   Ht Readings from Last 1 Encounters:  08/03/19 6\' 1"  (1.854 m)    Weight:   Wt Readings from Last 1 Encounters:  09/03/19 74 kg    BMI:  Body mass index is 21.52 kg/m.  Estimated Nutritional Needs:   Kcal:  2250-2625 kcals  Protein:  120-160 g  Fluid:  >/= 2 L  09-19-1986 RD, LDN Clinical Nutrition Pager listed in AMION

## 2019-09-04 NOTE — Progress Notes (Addendum)
[facility] PROGRESS NOTE  George Kelly NID:[id number] DOB: Feb 28, 1990 DOA: 07/25/2019 PCP: Patient, No Pcp Per  Status: Inpatient---Remains inpatient appropriate because:Altered mental status, Unsafe d/c plan and Inpatient level of care appropriate due to severity of illness   Dispo: The patient is from: Home              Anticipated d/c is to: SNF              Anticipated d/c date is: > 3 days              Patient currently is medically stable to d/c.  Patient continues to require intermittent one-on-one supervision in restraints because of sequelae from anoxic brain injury.  He is progressing slowly with PT and OT. SLP is advanced to mechanical soft diet with nectar thick. Mother has applied for guardianship and Medicaid. Unable to receive additional care at CIR due to lack of caregiver support after discharge therefore plan is to discharge to skilled nursing facility  Code Status: Full Family Communication: 7/20 updated patient's sister and mother by phone regarding current status  DVT prophylaxis: Lovenox  HPI: 29 yo male found unresponsive with probable drug overdose. PEA arrest with ROSC after about 15 minutes. Found to have lactic acidosis, AKI from rhabdomyolysis, aspiration pneumonia, shock liver, hypothermia. UDS positive for cocaine, benzo's, THC. Weaned off of precedex and weaning some of his sedative and psychotropic medications prior to transfer from unit.  Patient has been slowly progressing regards to improving mentation and improved respiratory status.  He was decannulated on 7/15 and is stable on room air.  Remains impulsive and must have one-to-one sitter when he is unrestrained for activity such as eating.   Subjective: Awakened.  At beginning of shift RN reported he had gotten out of his restraints and required Ativan for sedation.  He remains confused.  He told me he wanted to be hooked back up to the IV fluids which she pointed out stating they tasted good.   States he is hungry and would like to eat.  Objective: Vitals:   09/04/19 0021 09/04/19 0826  BP: 127/76 126/89  Pulse: 89 84  Resp: 17 18  Temp: 99.5 F (37.5 C) 98 F (36.7 C)  SpO2: 96%    No intake or output data in the 24 hours ending 09/04/19 1128 Filed Weights   08/31/19 0500 09/01/19 0500 09/03/19 0500  Weight: 68 kg 68 kg 74 kg    Exam: Constitutional: NAD, alert but restless and impulsive, remains confused Neck: normal, supple, no masses, tracheal stoma site with opening with thick purulent yellow drainage noted.  Patient had remove dressing. Respiratory: clear to auscultation bilaterally, Normal respiratory effort. No accessory muscle use.  Trach collar 28% FiO2. Since my initial evaluation patient has been decannulated and maintaining O2 sats of 100% on room air Cardiovascular: Regular rate and rhythm, no murmurs / rubs / gallops. No extremity edema. 2+ pedal pulses.  Abdomen: no tenderness, no masses palpated. No hepatosplenomegaly. Bowel sounds positive.  Genitourinary: Condom catheter in place with clear urine noted Musculoskeletal: no clubbing / cyanosis. No joint deformity upper and lower extremities. Good ROM, no contractures. Normal muscle tone.  Skin: no rashes, lesions, ulcers. No induration Neurologic: CN 2-12 grossly intact. Sensation intact, DTR not assessed.  Strength 5/5 x all 4 extremities.  Psychiatric: Alert and confused; oriented times name only.  Surprised to learn that he was in the hospital.  Was able to relate that it takes him "too  long" but up without help and he was to discontinue condom catheter   Assessment/Plan: Acute on chronic hypoxic Respiratory Failure requiring tracheostomy tube Out of hospital cardiac Arrest Aspiration Pneumonia/Enterobacter PNA -And tolerating tracheostomy collar 7/2 and was decannulated on 7/15 -Stoma remains minimally open with purulent yellow drainage noted - SLP following for language retraining -Alert and  attempting to speak, following some commands -Stoma culture drainage obtained on 7/17 consistent with a few MRSA.  Patient without any significant symptoms: he is afebrile,WBC downward  trend to 11.8 as of 7/14.  Will obtain CBC.  Suspect colonization as opposed to active infection so will not start antibiotics at this juncture  Dysphagia -PT evaluation today was able to tolerate some water and applesauce but still ready to have feeding tube removed -SLP has advanced to D3 a mechanical soft diet with nectar thick liquids as of 7/19  Nutrition Status: Nutrition Problem: Inadequate oral intake Etiology: acute illness Signs/Symptoms: recent NPO Interventions: Tube feeding to supplement Dysphagia 3 w/ nectar thick liquids Estimated body mass index is 21.52 kg/m as calculated from the following:   Height as of this encounter: 6\' 1"  (1.854 m).   Weight as of this encounter: 74 kg.  -Patient eating 100% of diet and asking for snacks in between-also ingesting 100% of protein supplements  Fever -Resolved -Suspect related to atelectasis   Acute metabolic encephalopathy 2nd to anoxic brain injury, sepsis  Hx of substance abuse -Patient currently without withdrawal symptoms and has been continued on appropriate medications during the hospitalization -7/15 it was noted that patient performed better with PT/OT/SLP prior to receiving sedative medications therefore began titrating offending medications: -Continue Ativan 1 mg as needed for breakthrough agitation with plans to eventually discontinue in favor of Klonopin to be used as primary prn agent once fully weaned -7/19 decrease scheduled Klonopin from 1 mg TID to 0.5 mg TID if tolerates for the next 3 days plan to decrease to twice daily x 3 days, then once daily x3 days then provide as as needed dosing and discontinue Ativan -7/19 decrease scheduled oxycodone 20 mg every 8 hours to 10 mg every 8 hours -Continue 200 mg BID-tolerates above changes  plan to decrease to 100 mg twice daily by the end of the week -Continue propranolol and taper as recommended -High risk for fall; when awake very impulsive and becomes agitated especially in the evening therefore will need to continue 1: 1 sitter   Hypertension -Continue amlodipine with goal SBP <150 -Continue labetalol and consider tapering as tolerated-current SBP well controlled although heart rate averages 70s to 90s -Continue IV propranolol prn  Anemia of critical illness -Hgb stable at 10.9-would transfuse for <7.   Physical deconditioning -PT and OT following with recommendation for LTAC versus SNF -Does not have appropriate 24-hour care after discharge therefore declined by CIR -7/19: Alert but remained confused during entirety of session.  Able to roll side to side and get to edge of bed with minimal assist x1 and was able to maintain midline sitting with minimal guard x2 for safety.  Occasional posterior lean detected especially when he was distracted.  Mobility significantly improved as well as gait training.  He was able to ambulate 100 feet in the hallway with minimal assist x2 with hand-held assistive device but was very unsteady due to scissoring pattern and impaired balance and coordination.  Hypomagnesemia -Magnesium 1.8 after replacement    Data Reviewed: Basic Metabolic Panel: Recent Labs  Lab 08/29/19 0947  NA 140  K 4.0  CL 99  CO2 31  GLUCOSE 111*  BUN 13  CREATININE 0.90  CALCIUM 9.7  MG 1.8   Liver Function Tests: Recent Labs  Lab 08/29/19 0947  AST 25  ALT 50*  ALKPHOS 162*  BILITOT 0.6  PROT 9.2*  ALBUMIN 2.6*   No results for input(s): LIPASE, AMYLASE in the last 168 hours. No results for input(s): AMMONIA in the last 168 hours. CBC: Recent Labs  Lab 08/29/19 0947 09/03/19 1344  WBC 11.8* 11.5*  NEUTROABS 7.2 6.2  HGB 10.9* 10.7*  HCT 33.7* 32.1*  MCV 85.3 83.8  PLT 331 261   Cardiac Enzymes: No results for input(s):  CKTOTAL, CKMB, CKMBINDEX, TROPONINI in the last 168 hours. BNP (last 3 results) No results for input(s): BNP in the last 8760 hours.  ProBNP (last 3 results) No results for input(s): PROBNP in the last 8760 hours.  CBG: Recent Labs  Lab 08/31/19 1641 09/02/19 0915 09/02/19 1336 09/02/19 1744 09/02/19 2032  GLUCAP 99 101* 78 81 78    Recent Results (from the past 240 hour(s))  Aerobic Culture (superficial specimen)     Status: None   Collection Time: 09/01/19  6:10 PM   Specimen: Wound; Tracheal  Result Value Ref Range Status   Specimen Description WOUND  Final   Special Requests TRACHEAL SITE  Final   Gram Stain   Final    RARE WBC PRESENT, PREDOMINANTLY PMN FEW GRAM POSITIVE COCCI Performed at [facility], [address] [place], [place] [address]    Culture FEW METHICILLIN RESISTANT STAPHYLOCOCCUS AUREUS  Final   Report Status 09/03/2019 FINAL  Final   Organism ID, Bacteria METHICILLIN RESISTANT STAPHYLOCOCCUS AUREUS  Final      Susceptibility   Methicillin resistant staphylococcus aureus - MIC*    CIPROFLOXACIN >=8 RESISTANT Resistant     ERYTHROMYCIN >=8 RESISTANT Resistant     GENTAMICIN <=0.5 SENSITIVE Sensitive     OXACILLIN >=4 RESISTANT Resistant     TETRACYCLINE <=1 SENSITIVE Sensitive     VANCOMYCIN <=0.5 SENSITIVE Sensitive     TRIMETH/SULFA 160 RESISTANT Resistant     CLINDAMYCIN <=0.25 SENSITIVE Sensitive     RIFAMPIN <=0.5 SENSITIVE Sensitive     Inducible Clindamycin NEGATIVE Sensitive     * FEW METHICILLIN RESISTANT STAPHYLOCOCCUS AUREUS     Studies: No results found.  Scheduled Meds: . amLODipine  5 mg Oral Daily  . chlorhexidine  15 mL Mouth Rinse BID  . Chlorhexidine Gluconate Cloth  6 each Topical Daily  . clonazePAM  0.5 mg Oral TID  . docusate  100 mg Oral BID  . enoxaparin (LOVENOX) injection  40 mg Subcutaneous Q24H  . labetalol  100 mg Oral BID  . magnesium oxide  400 mg Oral BID  . mouth rinse  15 mL Mouth Rinse q12n4p   . melatonin  5 mg Oral Daily  . oxyCODONE  10 mg Oral Q8H  . pantoprazole sodium  40 mg Oral Q24H  . polyethylene glycol  17 g Oral Daily  . propranolol  20 mg Oral TID  . QUEtiapine  200 mg Oral BID  . sodium chloride flush  10-40 mL Intracatheter Q12H  . valproic acid  500 mg Oral TID   Continuous Infusions: . sodium chloride Stopped (08/21/19 0548)    Active Problems:   Cardiac arrest (HCC)   Hyperkalemia   Acute respiratory failure with hypoxia (HCC)   Acute renal failure with tubular necrosis (HCC)  PEA (Pulseless electrical activity) (HCC)   Acute hypoxemic respiratory failure (HCC)   Acute renal failure (ARF) (HCC)   Status post tracheostomy (HCC)   Tracheostomy tube present (HCC)   Polysubstance abuse (HCC)   Leukocytosis   Acute blood loss anemia   Anoxic brain injury (HCC)   Dysphagia causing pulmonary aspiration with swallowing   Consultants:  PCCM  Nephrology  Procedures:  ETT placement  R Chest Tube  Trach  R Femoral HD Cath  R [place] CVL  L Radial A-line   Antibiotics: Anti-infectives (From admission, onward)   Start     Dose/Rate Route Frequency Ordered Stop   08/02/19 1400  ceFEPIme (MAXIPIME) 2 g in sodium chloride 0.9 % 100 mL IVPB        2 g 200 mL/hr over 30 Minutes Intravenous Every 8 hours 08/02/19 1232 08/09/19 0615   07/31/19 1000  linezolid (ZYVOX) IVPB 600 mg  Status:  Discontinued        600 mg 300 mL/hr over 60 Minutes Intravenous Every 12 hours 07/31/19 0827 08/02/19 0736   07/31/19 0830  meropenem (MERREM) 1 g in sodium chloride 0.9 % 100 mL IVPB  Status:  Discontinued        1 g 200 mL/hr over 30 Minutes Intravenous Every 8 hours 07/31/19 0827 08/02/19 1232   07/28/19 0800  piperacillin-tazobactam (ZOSYN) IVPB 3.375 g  Status:  Discontinued        3.375 g 12.5 mL/hr over 240 Minutes Intravenous Every 8 hours 07/28/19 0759 07/31/19 0827   07/27/19 1430  linezolid (ZYVOX) IVPB 600 mg  Status:  Discontinued        600  mg 300 mL/hr over 60 Minutes Intravenous Every 12 hours 07/27/19 1418 07/28/19 0749   07/26/19 1630  piperacillin-tazobactam (ZOSYN) IVPB 2.25 g  Status:  Discontinued        2.25 g 100 mL/hr over 30 Minutes Intravenous Every 8 hours 07/26/19 1029 07/28/19 0759   07/26/19 1045  vancomycin (VANCOCIN) IVPB 1000 mg/200 mL premix        1,000 mg 200 mL/hr over 60 Minutes Intravenous  Once 07/26/19 1033 07/26/19 1214   07/26/19 0801  vancomycin variable dose per unstable renal function (pharmacist dosing)  Status:  Discontinued         Does not apply See admin instructions 07/26/19 0801 07/27/19 1418   07/26/19 0700  piperacillin-tazobactam (ZOSYN) IVPB 3.375 g  Status:  Discontinued        3.375 g 100 mL/hr over 30 Minutes Intravenous Every 6 hours 07/26/19 0656 07/26/19 1028   07/25/19 1700  ceFEPIme (MAXIPIME) 2 g in sodium chloride 0.9 % 100 mL IVPB  Status:  Discontinued        2 g 200 mL/hr over 30 Minutes Intravenous Every 24 hours 07/25/19 1642 07/26/19 0650   07/25/19 1645  vancomycin (VANCOREADY) IVPB 1500 mg/300 mL        1,500 mg 150 mL/hr over 120 Minutes Intravenous  Once 07/25/19 1642 07/25/19 1901       Time spent: 20 minutes    Junious Silk ANP  [facility] Pager [phone]. If 7PM-7AM, please contact night-coverage at [email or web address] 09/04/2019, 11:28 AM  LOS: 41 days

## 2019-09-04 NOTE — Progress Notes (Addendum)
Pt has continued to be in restraints today.  Pt was able to get out of wrist restraints this am and was standing at the foot of the bed with 1 ankle restraint on per NT report this am.   Pt was agitated, restless, confused. Restraints were re-secured by NT and nurse, pt also given ativan this am for his agitation, restlessness.   Pt also given ativan this afternoon for agitation and attempting to get OOB.  Spoke with Revonda Standard, NP about pt, and restraint needs, pt has needed siderails up x4 for safety as well as bilat soft wrist and soft ankle today.  Order updated for restriants. As pt is able and cooperative less restrictive measures can be attempted when pt ready.

## 2019-09-04 NOTE — Progress Notes (Signed)
Occupational Therapy Treatment Patient Details Name: George Kelly MRN: [id number] DOB: 17-Mar-1990 Today's Date: 09/04/2019    History of present illness 29 yo male found unresponsive with probable drug overdose.  PEA arrest with ROSC after about 15 minutes.  Found to have lactic acidosis, AKI from rhabdomyolysis, aspiration pneumonia, shock liver, hypothermia.  UDS positive for cocaine, benzo's, THC.   6/17 tracheostomy.   OT comments  Pt received in bed asleep. RN reports she gave pt Ativan recently before OT arrival. Pt was lethargic throughout session which limited his participation and engagement during session. Pt intermittently opening his eyes and attempting to communicate with intelligible speech. Pt required totalA for washing his face and totalA for rolling in bed to reposition. Pt will continue to benefit from skilled OT services to maximize safety and independence with ADL/IADL and functional mobility. Will continue to follow acutely and progress as tolerated.    Follow Up Recommendations  SNF;LTACH    Equipment Recommendations  Other (comment) (TBD)    Recommendations for Other Services      Precautions / Restrictions Precautions Precautions: Fall Precaution Comments: condom cath, restraints, agitated, impulsive Restrictions Weight Bearing Restrictions: No       Mobility Bed Mobility Overal bed mobility: Needs Assistance Bed Mobility: Rolling Rolling: Total assist         General bed mobility comments: totalA to roll, pt not following motor sequencing and appeared to become agitated deferred further mobility  Transfers Overall transfer level: Needs assistance               General transfer comment: not appropriate due to pt's level of arousal    Balance                                           ADL either performed or assessed with clinical judgement   ADL Overall ADL's : Needs assistance/impaired                                        General ADL Comments: pt requiring totalA this session due to decreased level of arousal, follwing simple one steps commands with increased time and inconsistently;deferred mobility due to level of arousal     Vision       Perception     Praxis      Cognition Arousal/Alertness: Lethargic Behavior During Therapy: WFL for tasks assessed/performed Overall Cognitive Status: Difficult to assess                                 General Comments: attempting to verbalize but nonintelligible speech;pt with eyes shut majority of session, awoke intermittently to voice         Exercises Exercises: Other exercises Other Exercises Other Exercises: PROM at shoulders, elbows, wrists, knees, ankles, hips   Shoulder Instructions       General Comments VSS RA     Pertinent Vitals/ Pain       Pain Assessment: Faces Faces Pain Scale: No hurt Pain Intervention(s): Monitored during session  Home Living  Prior Functioning/Environment              Frequency  Min 2X/week        Progress Toward Goals  OT Goals(current goals can now be found in the care plan section)  Progress towards OT goals: Not progressing toward goals - comment (limited by level of arousal)  Acute Rehab OT Goals Patient Stated Goal: pt did not state OT Goal Formulation: Patient unable to participate in goal setting Potential to Achieve Goals: Fair ADL Goals Pt Will Perform Grooming: sitting;with supervision Pt/caregiver will Perform Home Exercise Program: Increased strength;Increased ROM;Both right and left upper extremity;With minimal assist;With written HEP provided Additional ADL Goal #1: Pt will tolerate sitting EOB >7 min with minA as precursor to EOB/OOB ADL. Additional ADL Goal #2: Pt will follow 1 step simple command with 75% accuracy during ADL/functional task.  Plan Discharge plan remains  appropriate    Co-evaluation                 AM-PAC OT "6 Clicks" Daily Activity     Outcome Measure   Help from another person eating meals?: A Little Help from another person taking care of personal grooming?: A Lot Help from another person toileting, which includes using toliet, bedpan, or urinal?: Total Help from another person bathing (including washing, rinsing, drying)?: Total Help from another person to put on and taking off regular upper body clothing?: Total Help from another person to put on and taking off regular lower body clothing?: Total 6 Click Score: 9    End of Session    OT Visit Diagnosis: Other abnormalities of gait and mobility (R26.89);Muscle weakness (generalized) (M62.81);Other symptoms and signs involving cognitive function   Activity Tolerance Patient limited by lethargy   Patient Left in bed;with call bell/phone within reach;with nursing/sitter in room;with restraints reapplied   Nurse Communication Mobility status        Time: 1431-1453 OT Time Calculation (min): 22 min  Charges: OT General Charges $OT Visit: 1 Visit OT Treatments $Self Care/Home Management : 8-22 mins  Rosey Bath OTR/L Acute Rehabilitation Services Office: [phone]    Rebeca Alert 09/04/2019, 3:45 PM

## 2019-09-05 MED ORDER — HALOPERIDOL LACTATE 5 MG/ML IJ SOLN
1.0000 mg | Freq: Four times a day (QID) | INTRAMUSCULAR | Status: DC | PRN
  Administered 2019-09-06 – 2019-09-07 (×2): 1 mg via INTRAVENOUS
  Filled 2019-09-05 (×2): qty 1

## 2019-09-05 MED ORDER — DIPHENHYDRAMINE HCL 50 MG/ML IJ SOLN
12.5000 mg | Freq: Three times a day (TID) | INTRAMUSCULAR | Status: DC | PRN
  Administered 2019-09-05 – 2019-09-08 (×5): 12.5 mg via INTRAVENOUS
  Filled 2019-09-05 (×5): qty 1

## 2019-09-05 NOTE — Progress Notes (Signed)
Physical Therapy Treatment Patient Details Name: George Kelly MRN: [id number] DOB: [birth year] Today's Date: 09/05/2019    History of Present Illness 29 yo male found unresponsive with probable drug overdose.  PEA arrest with ROSC after about 15 minutes.  Found to have lactic acidosis, AKI from rhabdomyolysis, aspiration pneumonia, shock liver, hypothermia.  UDS positive for cocaine, benzo's, THC.   6/17 tracheostomy.    PT Comments    Patient received in bed very lethargic but able to wake for participation in therapy; no agitation present today but very confused- still perseverating on looking for his iPhone and on pizza still on his tray. Able to complete bed mobility with min guard-MinA but needed Min-ModAx2 for safety with transfers and gait in room/hallway due to unsteadiness, poor safety awareness, and impulsivity. Actually lifted mattress up and got onto the floor looking under the bed for his phone (therapists maintained safe environment), able to complete all floor to stand mobility with min guard and cues for safety to avoid bumping his head. Able to redirect by telling him his mom is taking care of his things while he's in the hospital. Able to progress gait significantly but needed heavier levels of assist from 2 people due to gross unsteadiness, scissoring gait pattern, and impulsivity. Left in bed with all restraints reapplied and RN aware of patient status. Discussed case with attending MD/NP and currently planning to transition to [place] bed moving forward.     Follow Up Recommendations  SNF;Supervision/Assistance - 24 hour     Equipment Recommendations  Other (comment) (defer to next venue)    Recommendations for Other Services       Precautions / Restrictions Precautions Precautions: Fall Precaution Comments: condom cath, restraints, agitated, impulsive Restrictions Weight Bearing Restrictions: No    Mobility  Bed Mobility Overal bed mobility: Needs Assistance Bed  Mobility: Supine to Sit;Sit to Supine     Supine to sit: Min guard Sit to supine: Min assist   General bed mobility comments: min guard and verbal coaxing/cuing to get to EOB; did need MinA for initation of return to bed as he did not want to lay back down  Transfers Overall transfer level: Needs assistance Equipment used: 2 person hand held assist Transfers: Sit to/from Stand Sit to Stand: Min guard;+2 physical assistance         General transfer comment: min guardx2 for all fucntional transfers due to impulsivity and general unsteadiness- patient stating "can I do it myself?" often  Ambulation/Gait Ambulation/Gait assistance: Min assist;Mod assist Gait Distance (Feet): 180 Feet Assistive device: 2 person hand held assist Gait Pattern/deviations: Step-through pattern;Scissoring;Drifts right/left;Narrow base of support Gait velocity: decreased   General Gait Details: continues to have very narrow BOS and scissors, has unpredictable LOB laterally especially when distracted- generally required Min-ModA of 2 people to maintain safety but followed cues well in the hallway   Stairs             Wheelchair Mobility    Modified Rankin (Stroke Patients Only)       Balance Overall balance assessment: Needs assistance Sitting-balance support: Bilateral upper extremity supported;Feet supported Sitting balance-Leahy Scale: Fair Sitting balance - Comments: can lose balance especially when distracted- not very predictable   Standing balance support: Bilateral upper extremity supported;During functional activity Standing balance-Leahy Scale: Poor Standing balance comment: Min-ModAx2 to maintain balance  Cognition Arousal/Alertness: Lethargic Behavior During Therapy: WFL for tasks assessed/performed Overall Cognitive Status: Impaired/Different from baseline Area of Impairment: Attention;Following  commands;Safety/judgement;Awareness;Problem solving                   Current Attention Level: Sustained   Following Commands: Follows one step commands inconsistently Safety/Judgement: Decreased awareness of safety;Decreased awareness of deficits Awareness: Intellectual Problem Solving: Requires verbal cues;Requires tactile cues;Slow processing;Difficulty sequencing General Comments: initially lethargic but able to wake to the point he could participate in therapy; impulsive and with very poor safety awareness, perseverated on finding his cell phone      Exercises      General Comments        Pertinent Vitals/Pain Pain Assessment: Faces Faces Pain Scale: No hurt Pain Intervention(s): Limited activity within patient's tolerance;Monitored during session    Home Living                      Prior Function            PT Goals (current goals can now be found in the care plan section) Acute Rehab PT Goals Patient Stated Goal: pt did not state PT Goal Formulation: With patient Time For Goal Achievement: 09/06/19 Potential to Achieve Goals: Fair Progress towards PT goals: Progressing toward goals    Frequency    Min 3X/week      PT Plan Frequency needs to be updated    Co-evaluation PT/OT/SLP Co-Evaluation/Treatment: Yes Reason for Co-Treatment: Complexity of the patient's impairments (multi-system involvement);Necessary to address cognition/behavior during functional activity;For patient/therapist safety PT goals addressed during session: Mobility/safety with mobility;Balance        AM-PAC PT "6 Clicks" Mobility   Outcome Measure  Help needed turning from your back to your side while in a flat bed without using bedrails?: A Little Help needed moving from lying on your back to sitting on the side of a flat bed without using bedrails?: A Little Help needed moving to and from a bed to a chair (including a wheelchair)?: A Little Help needed standing  up from a chair using your arms (e.g., wheelchair or bedside chair)?: A Little Help needed to walk in hospital room?: A Lot Help needed climbing 3-5 steps with a railing? : A Lot 6 Click Score: 16    End of Session Equipment Utilized During Treatment: Gait belt Activity Tolerance: Patient tolerated treatment well Patient left: in bed;with call bell/phone within reach;with restraints reapplied Nurse Communication: Mobility status PT Visit Diagnosis: Muscle weakness (generalized) (M62.81);Difficulty in walking, not elsewhere classified (R26.2);Other abnormalities of gait and mobility (R26.89)     Time: 0017-4944 PT Time Calculation (min) (ACUTE ONLY): 27 min  Charges:  $Gait Training: 8-22 mins (co-tx)                     Madelaine Etienne, DPT, PN1   Supplemental Physical Therapist [facility]    Pager [phone] Acute Rehab Office [phone]

## 2019-09-05 NOTE — Progress Notes (Signed)
[facility] paged informed that patient has gotten out of bed 3 times. Ilean Skill LPN

## 2019-09-05 NOTE — Progress Notes (Addendum)
[facility] PROGRESS NOTE  George Kelly HYQ:[id number] DOB: [birth year] DOA: 07/25/2019 PCP: Patient, No Pcp Per  Status: Inpatient---Remains inpatient appropriate because:Altered mental status, Unsafe d/c plan and Inpatient level of care appropriate due to severity of illness   Dispo: The patient is from: Home              Anticipated d/c is to: SNF              Anticipated d/c date is: > 3 days              Patient currently is medically stable to d/c.  Patient continues to require intermittent one-on-one supervision in restraints because of sequelae from anoxic brain injury.  He is progressing slowly with PT and OT. SLP is advanced to mechanical soft diet with nectar thick. Mother has applied for guardianship and Medicaid. Unable to receive additional care at [facility] due to lack of caregiver support after discharge therefore plan is to discharge to skilled nursing facility  Code Status: Full Family Communication: 7/20 updated patient's sister and mother by phone regarding current status -7/21 mom and sister at bedside DVT prophylaxis: Lovenox  HPI: 29 yo male found unresponsive with probable drug overdose. PEA arrest with ROSC after about 15 minutes. Found to have lactic acidosis, AKI from rhabdomyolysis, aspiration pneumonia, shock liver, hypothermia. UDS positive for cocaine, benzo's, THC. Weaned off of precedex and weaning some of his sedative and psychotropic medications prior to transfer from unit.  Patient has been slowly progressing regards to improving mentation and improved respiratory status.  He was decannulated on 7/15 and is stable on room air.  Remains impulsive and must have one-to-one sitter when he is unrestrained for activity such as eating.  As of 7/21 SLP has advance patient to regular diet with thin liquids.   Subjective: Awake-sister and mother at bedside.  Patient very interactive.  Oriented to name and year.  Stated he was in the hospital but later clarified  that he thought he was in the psychiatric hospital.  Reoriented. Patient frustrated that having fecal incontinence noting he feels the urge to pass a bowel movement but stool passes before he is able to call for help.  Objective: Vitals:   09/05/19 0815 09/05/19 1204  BP: 126/81 119/77  Pulse: 85 88  Resp: 18 17  Temp: 98.7 F (37.1 C) 98.6 F (37 C)  SpO2: 100% 99%    Intake/Output Summary (Last 24 hours) at 09/05/2019 1215 Last data filed at 09/05/2019 9528 Gross per 24 hour  Intake --  Output 875 ml  Net -875 ml   Filed Weights   08/31/19 0500 09/01/19 0500 09/03/19 0500  Weight: 68 kg 68 kg 74 kg    Exam: Constitutional: NAD, alert, family at side so much less restless than when alone Neck: normal, supple, no masses, tracheal stoma site with opening with thick purulent yellow drainage noted.  Patient had remove dressing. Respiratory: clear to auscultation bilaterally, Normal respiratory effort. No accessory muscle use.  Room air.  Stoma has closed. Cardiovascular: Regular rate and rhythm, no murmurs / rubs / gallops. No extremity edema. 2+ pedal pulses.  Abdomen: no tenderness, no masses palpated. No hepatosplenomegaly. Bowel sounds positive.  Genitourinary: Condom catheter in place with clear urine noted Musculoskeletal: no clubbing / cyanosis. No joint deformity upper and lower extremities. Good ROM, no contractures. Normal muscle tone.  Skin: no rashes, lesions, ulcers. No induration Neurologic: CN 2-12 grossly intact. Sensation intact, DTR not assessed.  Strength  5/5 x all 4 extremities.  Psychiatric: Alert and confused; oriented x name and year and partially oriented to place.   Assessment/Plan: Acute on chronic hypoxic Respiratory Failure requiring tracheostomy tube Out of hospital cardiac Arrest Aspiration Pneumonia/Enterobacter PNA -Decannulated on 7/15 -Stoma has closed as of 7/21 -Alert and phonating appropriately -Stoma culture drainage obtained on 7/17  consistent with a few MRSA.  Patient without any significant symptoms: he is afebrile,WBC downward  trend to 11.8 as of 7/14.  Will obtain CBC.  Suspect colonization as opposed to active infection so will not start antibiotics at this juncture  Dysphagia -Resolved -7/21 SLP evaluated and advance patient to regular diet with thin liquids  Nutrition Status: Nutrition Problem: Inadequate oral intake Etiology: acute illness Signs/Symptoms: recent NPO Interventions: Tube feeding transition to modified diet and as of 7/21 is on a regular diet with thin liquids Estimated body mass index is 21.52 kg/m as calculated from the following:   Height as of this encounter: 6\' 1"  (1.854 m).   Weight as of this encounter: 74 kg.  -Patient eating 100% of diet and asking for snacks in between-also ingesting 100% of protein supplements  Fever -Resolved -Suspect related to atelectasis   Acute metabolic encephalopathy 2nd to anoxic brain injury, sepsis  Hx of substance abuse -Patient currently without withdrawal symptoms and has been continued on appropriate medications during the hospitalization -7/15 it was noted that patient performed better with PT/OT/SLP prior to receiving sedative medications therefore began titrating offending medications: -Continue Ativan 1 mg as needed for breakthrough agitation-continues to require at least 1-2 doses daily -7/19 decreased scheduled Klonopin from 1 mg TID to 0.5 mg TID  -7/19 decreased scheduled oxycodone 20 mg every 8 hours to 10 mg every 8 hours -Continue Seroquel 200 mg BID-tolerates above changes plan to decrease to 100 mg twice daily by the end of the week -Continue propranolol and taper as recommended -High risk for fall; when awake very impulsive and becomes agitated especially in the evening therefore will need to continue 1: 1 sitter -7/21: PT rec [place] bed to help get pt out of restraints  Hypertension -Continue amlodipine with goal SBP <150 -Continue  labetalol and consider tapering as tolerated-current SBP well controlled although heart rate averages 70s to 90s -Continue IV propranolol prn  Anemia of critical illness -Hgb stable at 10.9-would transfuse for <7.   Physical deconditioning -PT and OT following with recommendation for LTAC versus SNF -Does not have appropriate 24-hour care after discharge therefore declined by CIR -7/19: Alert but remained confused during entirety of session.  Able to roll side to side and get to edge of bed with minimal assist x1 and was able to maintain midline sitting with minimal guard x2 for safety.  Occasional posterior lean detected especially when he was distracted.  Mobility significantly improved as well as gait training.  He was able to ambulate 100 feet in the hallway with minimal assist x2 with hand-held assistive device but was very unsteady due to scissoring pattern and impaired balance and coordination.  Hypomagnesemia -Magnesium 1.8 after replacement    Data Reviewed: Basic Metabolic Panel: No results for input(s): NA, K, CL, CO2, GLUCOSE, BUN, CREATININE, CALCIUM, MG, PHOS in the last 168 hours. Liver Function Tests: No results for input(s): AST, ALT, ALKPHOS, BILITOT, PROT, ALBUMIN in the last 168 hours. No results for input(s): LIPASE, AMYLASE in the last 168 hours. No results for input(s): AMMONIA in the last 168 hours. CBC: Recent Labs  Lab 09/03/19 1344  WBC 11.5*  NEUTROABS 6.2  HGB 10.7*  HCT 32.1*  MCV 83.8  PLT 261   Cardiac Enzymes: No results for input(s): CKTOTAL, CKMB, CKMBINDEX, TROPONINI in the last 168 hours. BNP (last 3 results) No results for input(s): BNP in the last 8760 hours.  ProBNP (last 3 results) No results for input(s): PROBNP in the last 8760 hours.  CBG: Recent Labs  Lab 08/31/19 1641 09/02/19 0915 09/02/19 1336 09/02/19 1744 09/02/19 2032  GLUCAP 99 101* 78 81 78    Recent Results (from the past 240 hour(s))  Aerobic Culture  (superficial specimen)     Status: None   Collection Time: 09/01/19  6:10 PM   Specimen: Wound; Tracheal  Result Value Ref Range Status   Specimen Description WOUND  Final   Special Requests TRACHEAL SITE  Final   Gram Stain   Final    RARE WBC PRESENT, PREDOMINANTLY PMN FEW GRAM POSITIVE COCCI Performed at [facility], [address] [place], [place] [address]    Culture FEW METHICILLIN RESISTANT STAPHYLOCOCCUS AUREUS  Final   Report Status 09/03/2019 FINAL  Final   Organism ID, Bacteria METHICILLIN RESISTANT STAPHYLOCOCCUS AUREUS  Final      Susceptibility   Methicillin resistant staphylococcus aureus - MIC*    CIPROFLOXACIN >=8 RESISTANT Resistant     ERYTHROMYCIN >=8 RESISTANT Resistant     GENTAMICIN <=0.5 SENSITIVE Sensitive     OXACILLIN >=4 RESISTANT Resistant     TETRACYCLINE <=1 SENSITIVE Sensitive     VANCOMYCIN <=0.5 SENSITIVE Sensitive     TRIMETH/SULFA 160 RESISTANT Resistant     CLINDAMYCIN <=0.25 SENSITIVE Sensitive     RIFAMPIN <=0.5 SENSITIVE Sensitive     Inducible Clindamycin NEGATIVE Sensitive     * FEW METHICILLIN RESISTANT STAPHYLOCOCCUS AUREUS     Studies: No results found.  Scheduled Meds: . amLODipine  5 mg Oral Daily  . chlorhexidine  15 mL Mouth Rinse BID  . Chlorhexidine Gluconate Cloth  6 each Topical Daily  . clonazePAM  0.5 mg Oral TID  . docusate  100 mg Oral BID  . enoxaparin (LOVENOX) injection  40 mg Subcutaneous Q24H  . labetalol  100 mg Oral BID  . magnesium oxide  400 mg Oral BID  . mouth rinse  15 mL Mouth Rinse q12n4p  . melatonin  5 mg Oral Daily  . oxyCODONE  10 mg Oral Q8H  . pantoprazole sodium  40 mg Oral Q24H  . polyethylene glycol  17 g Oral Daily  . propranolol  20 mg Oral TID  . QUEtiapine  200 mg Oral BID  . sodium chloride flush  10-40 mL Intracatheter Q12H  . valproic acid  500 mg Oral TID   Continuous Infusions: . sodium chloride Stopped (08/21/19 0548)    Active Problems:   Cardiac arrest (HCC)    Hyperkalemia   Acute respiratory failure with hypoxia (HCC)   Acute renal failure with tubular necrosis (HCC)   PEA (Pulseless electrical activity) (HCC)   Acute hypoxemic respiratory failure (HCC)   Acute renal failure (ARF) (HCC)   Status post tracheostomy (HCC)   Tracheostomy tube present (HCC)   Polysubstance abuse (HCC)   Leukocytosis   Acute blood loss anemia   Anoxic brain injury (HCC)   Dysphagia causing pulmonary aspiration with swallowing   Subacute delirium   Consultants:  PCCM  Nephrology  Procedures:  ETT placement  R Chest Tube  Trach  R Femoral HD Cath  R [place] CVL  L Radial A-line   Antibiotics: Anti-infectives (From admission, onward)   Start     Dose/Rate Route Frequency Ordered Stop   08/02/19 1400  ceFEPIme (MAXIPIME) 2 g in sodium chloride 0.9 % 100 mL IVPB        2 g 200 mL/hr over 30 Minutes Intravenous Every 8 hours 08/02/19 1232 08/09/19 0615   07/31/19 1000  linezolid (ZYVOX) IVPB 600 mg  Status:  Discontinued        600 mg 300 mL/hr over 60 Minutes Intravenous Every 12 hours 07/31/19 0827 08/02/19 0736   07/31/19 0830  meropenem (MERREM) 1 g in sodium chloride 0.9 % 100 mL IVPB  Status:  Discontinued        1 g 200 mL/hr over 30 Minutes Intravenous Every 8 hours 07/31/19 0827 08/02/19 1232   07/28/19 0800  piperacillin-tazobactam (ZOSYN) IVPB 3.375 g  Status:  Discontinued        3.375 g 12.5 mL/hr over 240 Minutes Intravenous Every 8 hours 07/28/19 0759 07/31/19 0827   07/27/19 1430  linezolid (ZYVOX) IVPB 600 mg  Status:  Discontinued        600 mg 300 mL/hr over 60 Minutes Intravenous Every 12 hours 07/27/19 1418 07/28/19 0749   07/26/19 1630  piperacillin-tazobactam (ZOSYN) IVPB 2.25 g  Status:  Discontinued        2.25 g 100 mL/hr over 30 Minutes Intravenous Every 8 hours 07/26/19 1029 07/28/19 0759   07/26/19 1045  vancomycin (VANCOCIN) IVPB 1000 mg/200 mL premix        1,000 mg 200 mL/hr over 60 Minutes Intravenous  Once  07/26/19 1033 07/26/19 1214   07/26/19 0801  vancomycin variable dose per unstable renal function (pharmacist dosing)  Status:  Discontinued         Does not apply See admin instructions 07/26/19 0801 07/27/19 1418   07/26/19 0700  piperacillin-tazobactam (ZOSYN) IVPB 3.375 g  Status:  Discontinued        3.375 g 100 mL/hr over 30 Minutes Intravenous Every 6 hours 07/26/19 0656 07/26/19 1028   07/25/19 1700  ceFEPIme (MAXIPIME) 2 g in sodium chloride 0.9 % 100 mL IVPB  Status:  Discontinued        2 g 200 mL/hr over 30 Minutes Intravenous Every 24 hours 07/25/19 1642 07/26/19 0650   07/25/19 1645  vancomycin (VANCOREADY) IVPB 1500 mg/300 mL        1,500 mg 150 mL/hr over 120 Minutes Intravenous  Once 07/25/19 1642 07/25/19 1901       Time spent: 20 minutes    Junious Silk ANP  [facility] Pager [phone]. If 7PM-7AM, please contact night-coverage at [email or web address] 09/05/2019, 12:15 PM  LOS: 42 days

## 2019-09-05 NOTE — Progress Notes (Signed)
Occupational Therapy Treatment Patient Details Name: George Kelly MRN: [id number] DOB: [birth year] Today's Date: 09/05/2019    History of present illness 29 yo male found unresponsive with probable drug overdose.  PEA arrest with ROSC after about 15 minutes.  Found to have lactic acidosis, AKI from rhabdomyolysis, aspiration pneumonia, shock liver, hypothermia.  UDS positive for cocaine, benzo's, THC.   6/17 tracheostomy.   OT comments  Pt progressing with OT goals and goals updated accordingly. Pt initially lethargic, but easily awakened for activities. Pt with confusion, but no agitation today. Pt perseverating on finding his phone and eating his pizza. Pt actually lifted up mattress and got onto floor to look under his bed for his phone (while maintaining safe environment). Pt demonstrated ability to self feeding standing at bedside, able to don socks and additional hospital gown without physical assistance. During hallway distance mobility, pt with frequent instances of LOB (especially when distracted) requiring Min to Mod A to correct. Based on gait pattern, +2 handheld assist required to maintain safely. Plan to further assess ADLs in standing, attention to tasks, and following of multi-step commands.     Follow Up Recommendations  SNF;LTACH    Equipment Recommendations  Other (comment) (TBD)    Recommendations for Other Services      Precautions / Restrictions Precautions Precautions: Fall Precaution Comments: condom cath, restraints, agitated, impulsive Restrictions Weight Bearing Restrictions: No       Mobility Bed Mobility Overal bed mobility: Needs Assistance Bed Mobility: Supine to Sit;Sit to Supine     Supine to sit: Min guard Sit to supine: Min assist   General bed mobility comments: min guard and verbal coaxing/cuing to get to EOB; did need MinA for initation of return to bed as he did not want to lay back down  Transfers Overall transfer level: Needs  assistance Equipment used: 2 person hand held assist Transfers: Sit to/from Stand Sit to Stand: Min guard;+2 physical assistance         General transfer comment: min guardx2 for all fucntional transfers due to impulsivity and general unsteadiness- patient stating "can I do it myself?" often    Balance Overall balance assessment: Needs assistance Sitting-balance support: Bilateral upper extremity supported;Feet supported Sitting balance-Leahy Scale: Fair Sitting balance - Comments: can lose balance especially when distracted- not very predictable Postural control: Posterior lean Standing balance support: Bilateral upper extremity supported;During functional activity Standing balance-Leahy Scale: Poor Standing balance comment: Min-ModAx2 to maintain balance with frequent LOB                           ADL either performed or assessed with clinical judgement   ADL Overall ADL's : Needs assistance/impaired Eating/Feeding: Set up;Sitting (and standing at bedside) Eating/Feeding Details (indicate cue type and reason): Pt setup for self feeding, perserverating on standing to eat pizza and drink soda in middle of session             Upper Body Dressing : Set up;Sitting Upper Body Dressing Details (indicate cue type and reason): Setup to don hospital gown around back sitting EOB Lower Body Dressing: Supervision/safety Lower Body Dressing Details (indicate cue type and reason): Supervision to don socks sitting EOB Toilet Transfer: Minimal assistance;+2 for safety/equipment;Ambulation Toilet Transfer Details (indicate cue type and reason): simulated in room/hallway. Min A + 2 grossly to maintain standing balance with HHA and cues for attending to task to decrease risk of tripping  Functional mobility during ADLs: Minimal assistance;+2 for safety/equipment;Cueing for safety;Cueing for sequencing General ADL Comments: Pt initially lethargic, but awakened easily to  participate. Pt requires consistent cues to stay on task and maintain safety. Pt with impaired standing balance requiring physical assist to correct > 3 times during ambulation     Vision       Perception     Praxis      Cognition Arousal/Alertness: Lethargic Behavior During Therapy: WFL for tasks assessed/performed Overall Cognitive Status: Impaired/Different from baseline Area of Impairment: Attention;Following commands;Safety/judgement;Awareness;Problem solving                   Current Attention Level: Sustained   Following Commands: Follows one step commands inconsistently Safety/Judgement: Decreased awareness of safety;Decreased awareness of deficits Awareness: Intellectual Problem Solving: Requires verbal cues;Requires tactile cues;Slow processing;Difficulty sequencing General Comments: initially lethargic but able to wake to the point he could participate in therapy; impulsive and with very poor safety awareness, perseverated on finding his cell phone        Exercises     Shoulder Instructions       General Comments Decreased agitation today. Pt highly distractible and does require redirection and reassurance throughout session to stay on task and maintain safety    Pertinent Vitals/ Pain       Pain Assessment: Faces Faces Pain Scale: No hurt Pain Intervention(s): Limited activity within patient's tolerance;Monitored during session  Home Living                                          Prior Functioning/Environment              Frequency  Min 2X/week        Progress Toward Goals  OT Goals(current goals can now be found in the care plan section)  Progress towards OT goals: Progressing toward goals  Acute Rehab OT Goals Patient Stated Goal: find phone OT Goal Formulation: Patient unable to participate in goal setting Potential to Achieve Goals: Fair ADL Goals Pt Will Perform Grooming: with min guard assist;standing Pt  Will Perform Lower Body Dressing: with set-up;sit to/from stand Pt/caregiver will Perform Home Exercise Program: Increased strength;Increased ROM;Both right and left upper extremity;With minimal assist;With written HEP provided Additional ADL Goal #1: Pt will tolerate sitting EOB >7 min with minA as precursor to EOB/OOB ADL. Additional ADL Goal #2: Pt will follow 1 step simple command with 75% accuracy during ADL/functional task.  Plan Discharge plan remains appropriate    Co-evaluation    PT/OT/SLP Co-Evaluation/Treatment: Yes Reason for Co-Treatment: Complexity of the patient's impairments (multi-system involvement);Necessary to address cognition/behavior during functional activity;For patient/therapist safety PT goals addressed during session: Mobility/safety with mobility;Balance OT goals addressed during session: ADL's and self-care      AM-PAC OT "6 Clicks" Daily Activity     Outcome Measure   Help from another person eating meals?: A Little Help from another person taking care of personal grooming?: A Little Help from another person toileting, which includes using toliet, bedpan, or urinal?: Total Help from another person bathing (including washing, rinsing, drying)?: A Lot Help from another person to put on and taking off regular upper body clothing?: A Little Help from another person to put on and taking off regular lower body clothing?: A Little 6 Click Score: 15    End of Session Equipment Utilized During Treatment: Gait belt  OT Visit Diagnosis: Other abnormalities of gait and mobility (R26.89);Muscle weakness (generalized) (M62.81);Other symptoms and signs involving cognitive function   Activity Tolerance Patient tolerated treatment well   Patient Left in bed;with call bell/phone within reach;with restraints reapplied   Nurse Communication Mobility status        Time: 6761-9509 OT Time Calculation (min): 28 min  Charges: OT General Charges $OT Visit: 1  Visit OT Evaluation $OT Eval Moderate Complexity: 1 Mod OT Treatments $Self Care/Home Management : 8-22 mins  Lorre Munroe, OTR/L   Lorre Munroe 09/05/2019, 2:14 PM

## 2019-09-05 NOTE — Progress Notes (Signed)
  Speech Language Pathology Treatment: Dysphagia  Patient Details Name: George Kelly MRN: [id number] DOB: 10-31-1990 Today's Date: 09/05/2019 Time: 3235-5732 SLP Time Calculation (min) (ACUTE ONLY): 24 min  Assessment / Plan / Recommendation Clinical Impression  George Kelly seen for skilled dysphagia treatment session with mom and sister present. Pt granted permission to provide updates on evaluation, progress and goals thus far for family. Fortunately his stoma is adequately closed from last session 7/19 from visual inspection- in addition no audible air leakage. One delayed throat clear across 4 -5 oz straw sips thin markedly improved. He does need cues from not speaking after swallows with rationale provided. No significant changes following solid trials and thin liquids. His cognition is improving significantly however continues to have impairments that require full supervision with all activities (not following for cognition but mentioned his need for 24 hour assist). Reviewed general swallow precautions. Will follow up once briefly for upgraded texture regular and thin liquids, straws allowd, pill whole in puree.     HPI HPI: 29 yo male found unresponsive with probable drug overdose.  PEA arrest with ROSC after about 15 minutes.  Found to have lactic acidosis, AKI from rhabdomyolysis, aspiration pneumonia, shock liver, hypothermia, acute metabolic encephalopathy 2nd to anoxia, sepsis.  UDS positive for cocaine, benzo's, THC.   ETT 6/09-6/21; 6/21 tracheostomy. Has been tolerating trach collar since 7/2; has cortrak.      SLP Plan  Continue with current plan of care       Recommendations  Diet recommendations: Regular;Thin liquid Liquids provided via: Cup;Straw Medication Administration: Whole meds with puree Supervision: Patient able to self feed;Full supervision/cueing for compensatory strategies Compensations: Minimize environmental distractions;Slow rate;Small sips/bites;Other (Comment)  (no vocalizations after swallowing) Postural Changes and/or Swallow Maneuvers: Seated upright 90 degrees                Oral Care Recommendations: Oral care BID Follow up Recommendations: Outpatient SLP;Home health SLP (not for swallow but his cognition) SLP Visit Diagnosis: Dysphagia, unspecified (R13.10) Plan: Continue with current plan of care       GO                [name] 09/05/2019, 10:07 AM  [name] [name].Ed Nurse, children's [phone] Office [phone]

## 2019-09-05 NOTE — Plan of Care (Signed)
  Problem: Clinical Measurements: Goal: Ability to maintain clinical measurements within normal limits will improve Outcome: Progressing Goal: Will remain free from infection Outcome: Progressing Goal: Diagnostic test results will improve Outcome: Progressing Goal: Respiratory complications will improve Outcome: Progressing Goal: Cardiovascular complication will be avoided Outcome: Progressing   Problem: Activity: Goal: Risk for activity intolerance will decrease Outcome: Progressing   Problem: Elimination: Goal: Will not experience complications related to bowel motility Outcome: Progressing Goal: Will not experience complications related to urinary retention Outcome: Progressing   Problem: Coping: Goal: Level of anxiety will decrease Outcome: Progressing   Problem: Safety: Goal: Ability to remain free from injury will improve Outcome: Progressing   Problem: Pain Managment: Goal: General experience of comfort will improve Outcome: Progressing   Problem: Skin Integrity: Goal: Risk for impaired skin integrity will decrease Outcome: Progressing   Problem: Respiratory: Goal: Ability to maintain a clear airway and adequate ventilation will improve Outcome: Progressing   Problem: Role Relationship: Goal: Method of communication will improve Outcome: Progressing   Problem: Role Relationship: Goal: Method of communication will improve Outcome: Progressing

## 2019-09-05 NOTE — Progress Notes (Signed)
RN explained enclosure bed apparatus to pt and his mother. Mother stated it would cause pt to become more combative and that she did not want it delivered to pt's room. RN provided emotional support. RN notified Rennis Harding NP and will continue to use non-violent restraints. RN will continue to monitor pt.

## 2019-09-05 NOTE — Progress Notes (Signed)
Pt is in low bed with fall mat and in 4 point restraints. Bed alarm was on. RN heard banging noise come from pt's room. As RN approached room, bed alarm went off. RN found pt out of his restraints and sitting on the floor. RN had charge nurse and rest of floor staff join her in pt's room. Post fall huddle completed. RN notified Rennis Harding NP and Roda Shutters MD. Pt reported no new sites of pain, pt's skin showed no new injuries, and vital signs stable. RN placed back in restraints. Per Roda Shutters MD, RN administered 1 mg of ativan. RN will continue to monitor pt. 2W director notified of fall.

## 2019-09-06 ENCOUNTER — Inpatient Hospital Stay: Payer: Self-pay

## 2019-09-06 DIAGNOSIS — I10 Essential (primary) hypertension: Secondary | ICD-10-CM

## 2019-09-06 LAB — COMPREHENSIVE METABOLIC PANEL
ALT: 18 U/L (ref 0–44)
AST: 16 U/L (ref 15–41)
Albumin: 2.6 g/dL — ABNORMAL LOW (ref 3.5–5.0)
Alkaline Phosphatase: 97 U/L (ref 38–126)
Anion gap: 10 (ref 5–15)
BUN: 10 mg/dL (ref 6–20)
CO2: 25 mmol/L (ref 22–32)
Calcium: 8.8 mg/dL — ABNORMAL LOW (ref 8.9–10.3)
Chloride: 101 mmol/L (ref 98–111)
Creatinine, Ser: 0.79 mg/dL (ref 0.61–1.24)
GFR calc Af Amer: >60 mL/min (ref >60)
GFR calc non Af Amer: >60 mL/min (ref >60)
Glucose, Bld: 92 mg/dL (ref 70–99)
Potassium: 4 mmol/L (ref 3.5–5.1)
Sodium: 136 mmol/L (ref 135–145)
Total Bilirubin: 0.7 mg/dL (ref 0.3–1.2)
Total Protein: 7.8 g/dL (ref 6.5–8.1)

## 2019-09-06 LAB — CBC WITH DIFFERENTIAL/PLATELET
Abs Immature Granulocytes: 0.02 10*3/uL (ref 0.00–0.07)
Basophils Absolute: 0.1 10*3/uL (ref 0.0–0.1)
Basophils Relative: 1 %
Eosinophils Absolute: 1.4 10*3/uL — ABNORMAL HIGH (ref 0.0–0.5)
Eosinophils Relative: 17 %
HCT: 29.9 % — ABNORMAL LOW (ref 39.0–52.0)
Hemoglobin: 9.8 g/dL — ABNORMAL LOW (ref 13.0–17.0)
Immature Granulocytes: 0 %
Lymphocytes Relative: 30 %
Lymphs Abs: 2.4 10*3/uL (ref 0.7–4.0)
MCH: 27.8 pg (ref 26.0–34.0)
MCHC: 32.8 g/dL (ref 30.0–36.0)
MCV: 84.7 fL (ref 80.0–100.0)
Monocytes Absolute: 1 10*3/uL (ref 0.1–1.0)
Monocytes Relative: 12 %
Neutro Abs: 3.2 10*3/uL (ref 1.7–7.7)
Neutrophils Relative %: 40 %
Platelets: 246 10*3/uL (ref 150–400)
RBC: 3.53 MIL/uL — ABNORMAL LOW (ref 4.22–5.81)
RDW: 14.4 % (ref 11.5–15.5)
WBC: 8 10*3/uL (ref 4.0–10.5)
nRBC: 0 % (ref 0.0–0.2)

## 2019-09-06 LAB — RPR
RPR Ser Ql: REACTIVE — AB
RPR Titer: 1:2 {titer}

## 2019-09-06 LAB — AMMONIA: Ammonia: 39 umol/L — ABNORMAL HIGH (ref 9–35)

## 2019-09-06 LAB — TSH: TSH: 4.36 u[IU]/mL (ref 0.350–4.500)

## 2019-09-06 LAB — MAGNESIUM: Magnesium: 1.8 mg/dL (ref 1.7–2.4)

## 2019-09-06 MED ORDER — CLONAZEPAM 0.5 MG PO TABS: 0.2500 mg | ORAL_TABLET | Freq: Three times a day (TID) | ORAL | Status: DC

## 2019-09-06 MED ORDER — NICOTINE 14 MG/24HR TD PT24
14.0000 mg | MEDICATED_PATCH | Freq: Every day | TRANSDERMAL | Status: DC
  Administered 2019-09-06 – 2019-09-13 (×8): 14 mg via TRANSDERMAL
  Filled 2019-09-06 (×8): qty 1

## 2019-09-06 MED ORDER — CLONAZEPAM 0.25 MG PO TBDP
0.2500 mg | ORAL_TABLET | Freq: Three times a day (TID) | ORAL | Status: DC
  Administered 2019-09-06 – 2019-09-08 (×6): 0.25 mg via ORAL
  Filled 2019-09-06 (×6): qty 1

## 2019-09-06 MED ORDER — MAGNESIUM SULFATE 2 GM/50ML IV SOLN
2.0000 g | Freq: Once | INTRAVENOUS | Status: AC
  Administered 2019-09-06: 2 g via INTRAVENOUS
  Filled 2019-09-06: qty 50

## 2019-09-06 MED ORDER — PROPRANOLOL HCL 20 MG/5ML PO SOLN
40.0000 mg | Freq: Three times a day (TID) | ORAL | Status: DC
  Administered 2019-09-06 – 2019-09-07 (×3): 40 mg via ORAL
  Filled 2019-09-06 (×3): qty 10

## 2019-09-06 MED ORDER — AMLODIPINE BESYLATE 5 MG PO TABS: 5.0000 mg | ORAL_TABLET | Freq: Every day | ORAL | Status: DC

## 2019-09-06 NOTE — Progress Notes (Signed)
Pt began the shift with throwing his breakfast, verbal aggression , and getting up out of bed while in 4 point soft restraints. Gave a combination of medications, pt became calm and worked with PT. 1:1 sitter was available at 11am and pt was transferred from low bed to [facility] enclosed bed. He has been compliant with medications, and has been following commands. He is now eating dinner independently and is watching tv.

## 2019-09-06 NOTE — Progress Notes (Signed)
Patient with PICC insertion order.  History of multiple PIV infiltration.  Patient presently in veil bed which does not raise or goes into positotn for sterile procedure.  Patient also confused and unable to cooperate.  Will not hold arm out and consistently moving within bed.  Unable to procede with PICC placement.  If PICC placement desired would recommend sedation and more functional bed. PIV with ultrasound placed in right forearm for IV therapy during the night.  IV team will reevaluated in am and make more arrangements.

## 2019-09-06 NOTE — Progress Notes (Signed)
Physical Therapy Treatment Patient Details Name: George Kelly MRN: [id number] DOB: [birth year] Today's Date: 09/06/2019    History of Present Illness 29 yo male found unresponsive with probable drug overdose.  PEA arrest with ROSC after about 15 minutes.  Found to have lactic acidosis, AKI from rhabdomyolysis, aspiration pneumonia, shock liver, hypothermia.  UDS positive for cocaine, benzo's, THC.   6/17 tracheostomy.    PT Comments    Patient received in bed lethargic due to sedative medications RN had given earlier (patient with agitated and wild behaviors earlier in the day); RN and NT assisted in transfer to vail bed this morning. He did need much higher levels of physical assist due to sedation however we were able to transfer him from low bed and into vail bed with ModAx2 and Max cues/Maximal coaxing from RN/PT as he did resist mobility somewhat today. Left secured in vail bed with nursing sitter present, RN aware of patient status, all needs otherwise met.     Follow Up Recommendations  SNF;Supervision/Assistance - 24 hour     Equipment Recommendations  Other (comment) (defer to next venue)    Recommendations for Other Services       Precautions / Restrictions Precautions Precautions: Fall Precaution Comments: condom cath, restraints, agitated, impulsive, now in vail bed Restrictions Weight Bearing Restrictions: No    Mobility  Bed Mobility Overal bed mobility: Needs Assistance Bed Mobility: Supine to Sit     Supine to sit: Mod assist;+2 for physical assistance     General bed mobility comments: ModAx2 for all mobility due to lethargy/medications- once at EOB MaxA to maintain balance due to leaning backwards and resisting getting EOB  Transfers Overall transfer level: Needs assistance Equipment used: 2 person hand held assist Transfers: Sit to/from Stand Sit to Stand: Mod assist;+2 physical assistance Stand pivot transfers: Mod assist;+2 physical assistance        General transfer comment: ModAx2 with PT and RN- patient very unsteady and impulsive with very poor safety awareness and Max cues to get to ecdge of vail bed  Ambulation/Gait         Gait velocity: decreased   General Gait Details: deferred- lethargy and impulsivitiy   Stairs             Wheelchair Mobility    Modified Rankin (Stroke Patients Only)       Balance Overall balance assessment: Needs assistance Sitting-balance support: Bilateral upper extremity supported;Feet supported Sitting balance-Leahy Scale: Poor Sitting balance - Comments: MaxA to maintain sitting balance due to lethargy and affect of meds Postural control: Posterior lean Standing balance support: Bilateral upper extremity supported;During functional activity Standing balance-Leahy Scale: Poor Standing balance comment: ModAx2 to maintain balance and safety                            Cognition Arousal/Alertness: Suspect due to medications Behavior During Therapy: Impulsive Overall Cognitive Status: Impaired/Different from baseline Area of Impairment: Attention;Following commands;Safety/judgement;Awareness;Problem solving                   Current Attention Level: Focused   Following Commands: Follows one step commands inconsistently Safety/Judgement: Decreased awareness of safety;Decreased awareness of deficits Awareness: Intellectual Problem Solving: Requires verbal cues;Requires tactile cues;Slow processing;Difficulty sequencing General Comments: lethargic due to medications- RN reports he had been getting wild earlier in the morning and had given him a sedative      Exercises      General Comments General  comments (skin integrity, edema, etc.): lethargic and with reduced awareness/cognition due to sedatives given earlier, Max cues and coaxing by PT and RN for transfer to vail bed      Pertinent Vitals/Pain Pain Assessment: Faces Pain Score: 0-No pain Faces  Pain Scale: No hurt Pain Intervention(s): Limited activity within patient's tolerance;Monitored during session    Home Living                      Prior Function            PT Goals (current goals can now be found in the care plan section) Acute Rehab PT Goals Patient Stated Goal: find phone PT Goal Formulation: With patient Time For Goal Achievement: 09/20/19 Potential to Achieve Goals: Fair Progress towards PT goals: Progressing toward goals    Frequency    Min 3X/week      PT Plan Current plan remains appropriate    Co-evaluation              AM-PAC PT "6 Clicks" Mobility   Outcome Measure  Help needed turning from your back to your side while in a flat bed without using bedrails?: A Lot Help needed moving from lying on your back to sitting on the side of a flat bed without using bedrails?: A Lot Help needed moving to and from a bed to a chair (including a wheelchair)?: A Lot Help needed standing up from a chair using your arms (e.g., wheelchair or bedside chair)?: A Lot Help needed to walk in hospital room?: A Lot Help needed climbing 3-5 steps with a railing? : Total 6 Click Score: 11    End of Session Equipment Utilized During Treatment: Gait belt Activity Tolerance: Patient limited by lethargy Patient left: in bed;with call bell/phone within reach;with nursing/sitter in room (in secured vail bed) Nurse Communication: Mobility status PT Visit Diagnosis: Muscle weakness (generalized) (M62.81);Difficulty in walking, not elsewhere classified (R26.2);Other abnormalities of gait and mobility (R26.89)     Time: 6579-0383 PT Time Calculation (min) (ACUTE ONLY): 20 min  Charges:  $Therapeutic Activity: 8-22 mins                     Windell Norfolk, DPT, PN1   Supplemental Physical Therapist [place]    Pager [phone] Acute Rehab Office [phone]

## 2019-09-06 NOTE — Progress Notes (Addendum)
[facility] PROGRESS NOTE  George HeritageLamar Kelly UJW:[id number] DOB: 02-06-1991 DOA: 07/25/2019 PCP: Patient, No Pcp Per  Status: Inpatient---Remains inpatient appropriate because:Altered mental status, Unsafe d/c plan and Inpatient level of care appropriate due to severity of illness   Dispo: The patient is from: Home              Anticipated d/c is to: SNF              Anticipated d/c date is: > 3 days              Patient currently not able to discharge.  Patient continues to require intermittent one-on-one supervision in restraints because of sequelae from anoxic brain injury.  He is progressing slowly with PT and OT. SLP is advanced to mechanical soft diet with nectar thick. Mother has applied for guardianship and Medicaid. [facility] lack of caregiver support after discharge therefore plan is to discharge to skilled nursing facility  Code Status: Full Family Communication: 7/20 updated patient's sister and mother by phone regarding current status -7/21 mom and sister at bedside DVT prophylaxis: Lovenox  HPI: 29 yo male found unresponsive with probable drug overdose. PEA arrest with ROSC after about 15 minutes. Found to have lactic acidosis, AKI from rhabdomyolysis, aspiration pneumonia, shock liver, hypothermia. UDS positive for cocaine, benzo's, THC. Weaned off of precedex and weaning some of his sedative and psychotropic medications prior to transfer from unit.  Patient has been slowly progressing regards to improving mentation and improved respiratory status.  He was decannulated on 7/15 and is stable on room air.  Remains impulsive and must have one-to-one sitter when he is unrestrained for activity such as eating.  As of 7/21 SLP has advance patient to regular diet with thin liquids.   Subjective: He was agitated overnight, this morning he is  Sedated, vital signs are stable, he is on room air.  Objective: Vitals:   09/05/19 2113 09/06/19  0806  BP: 119/76 120/82  Pulse: 89 96  Resp: 16 18  Temp: 97.8 F (36.6 C) 98.3 F (36.8 C)  SpO2: 98% 94%    Intake/Output Summary (Last 24 hours) at 09/06/2019 0811 Last data filed at 09/06/2019 [id number] Gross per 24 hour  Intake 350 ml  Output 2000 ml  Net -1650 ml   Filed Weights   08/31/19 0500 09/01/19 0500 09/03/19 0500  Weight: 68 kg 68 kg 74 kg    Exam: Constitutional: Sedated, normal respiratory effort on room air, open eyes briefly to voice, in restraints Neck: normal, supple, no masses, no drainage noticed at  tracheal stoma site.  Patient had remove dressing. Respiratory: clear to auscultation bilaterally, Normal respiratory effort. No accessory muscle use.  Room air.  Stoma has closed. Cardiovascular: Regular rate and rhythm, no murmurs / rubs / gallops. No extremity edema. 2+ pedal pulses.  Abdomen: no tenderness, no masses palpated. No hepatosplenomegaly. Bowel sounds positive.  Genitourinary: Condom catheter in place with clear urine noted Musculoskeletal: no clubbing / cyanosis. No joint deformity upper and lower extremities. Good ROM, no contractures. Normal muscle tone.  Skin: no rashes, lesions, ulcers. No induration Neurologic: CN 2-12 grossly intact. Sensation intact, DTR not assessed.  Moving extremities spontaneously.  Psychiatric: Sedated   Assessment/Plan: Acute on chronic hypoxic Respiratory Failure requiring tracheostomy tube Out of hospital cardiac Arrest Aspiration Pneumonia/Enterobacter PNA -Decannulated on 7/15 -Stoma has closed as of 7/21 -Alert and phonating appropriately -Stoma culture drainage obtained on 7/17 consistent  with a few MRSA.  Patient without any significant symptoms: he is afebrile,WBC normalized on July 22 .  Suspect colonization as opposed to active infection so will not start antibiotics at this juncture  Dysphagia -Resolved -7/21 SLP evaluated and advance patient to regular diet with thin liquids  Nutrition  Status: Nutrition Problem: Inadequate oral intake Etiology: acute illness Signs/Symptoms: recent NPO Interventions: Tube feeding transition to modified diet and as of 7/21 is on a regular diet with thin liquids Estimated body mass index is 21.52 kg/m as calculated from the following:   Height as of this encounter: 6\' 1"  (1.854 m).   Weight as of this encounter: 74 kg.  -Patient eating 100% of diet and asking for snacks in between-also ingesting 100% of protein supplements  Fever -Resolved -Suspect related to atelectasis   Acute metabolic encephalopathy 2nd to anoxic brain injury, sepsis  Hx of substance abuse -7/15 it was noted that patient performed better with PT/OT/SLP prior to receiving sedative medications therefore began titrating offending medications: --7/19 decreased scheduled Klonopin from 1 mg TID to 0.5 mg TID , 7/22 decrease Klonopin from 0.5 3 times daily to 0.25 3 times daily -7/19 decreased scheduled oxycodone 20 mg every 8 hours to 10 mg every 8 hours -Continue Seroquel 200 mg BID-tolerates above changes plan to decrease to 100 mg twice daily by the end of the week -increase propranolol, d/c labetalol - continue Ativan 1 mg as needed for breakthrough agitation-continues to require at least 1-2 doses daily, add on low dose Benadryl and Haldol -positive RPR, t pallidum ab in process -High risk for fall; when awake very impulsive and becomes agitated especially in the evening therefore will need to continue 1: 1 sitter -7/21: PT rec Vail bed to help get pt out of restraints  Hypertension --d/c  labetalol  -D/c norvasc -increase propranolol to 40mg  TID   Anemia of critical illness -Hgb stable at 10.9-would transfuse for <7.   Physical deconditioning -PT and OT following with recommendation for LTAC versus SNF -Does not have appropriate 24-hour care after discharge therefore declined by CIR -7/19: Alert but remained confused during entirety of session.  Able to  roll side to side and get to edge of bed with minimal assist x1 and was able to maintain midline sitting with minimal guard x2 for safety.  Occasional posterior lean detected especially when he was distracted.  Mobility significantly improved as well as gait training.  He was able to ambulate 100 feet in the hallway with minimal assist x2 with hand-held assistive device but was very unsteady due to scissoring pattern and impaired balance and coordination.  Hypomagnesemia -Magnesium 1.8 after replacement -give iv mag x1 on 7/22  abnormal LFT, has normalized on 7/22    Data Reviewed: Basic Metabolic Panel: Recent Labs  Lab 09/06/19 0242  NA 136  K 4.0  CL 101  CO2 25  GLUCOSE 92  BUN 10  CREATININE 0.79  CALCIUM 8.8*  MG 1.8   Liver Function Tests: Recent Labs  Lab 09/06/19 0242  AST 16  ALT 18  ALKPHOS 97  BILITOT 0.7  PROT 7.8  ALBUMIN 2.6*   No results for input(s): LIPASE, AMYLASE in the last 168 hours. Recent Labs  Lab 09/06/19 0242  AMMONIA 39*   CBC: Recent Labs  Lab 09/03/19 1344 09/06/19 0242  WBC 11.5* 8.0  NEUTROABS 6.2 3.2  HGB 10.7* 9.8*  HCT 32.1* 29.9*  MCV 83.8 84.7  PLT 261 246   Cardiac Enzymes:  No results for input(s): CKTOTAL, CKMB, CKMBINDEX, TROPONINI in the last 168 hours. BNP (last 3 results) No results for input(s): BNP in the last 8760 hours.  ProBNP (last 3 results) No results for input(s): PROBNP in the last 8760 hours.  CBG: Recent Labs  Lab 08/31/19 1641 09/02/19 0915 09/02/19 1336 09/02/19 1744 09/02/19 2032  GLUCAP 99 101* 78 81 78    Recent Results (from the past 240 hour(s))  Aerobic Culture (superficial specimen)     Status: None   Collection Time: 09/01/19  6:10 PM   Specimen: Wound; Tracheal  Result Value Ref Range Status   Specimen Description WOUND  Final   Special Requests TRACHEAL SITE  Final   Gram Stain   Final    RARE WBC PRESENT, PREDOMINANTLY PMN FEW GRAM POSITIVE COCCI Performed at [facility], [address] [place], [place] [address]    Culture FEW METHICILLIN RESISTANT STAPHYLOCOCCUS AUREUS  Final   Report Status 09/03/2019 FINAL  Final   Organism ID, Bacteria METHICILLIN RESISTANT STAPHYLOCOCCUS AUREUS  Final      Susceptibility   Methicillin resistant staphylococcus aureus - MIC*    CIPROFLOXACIN >=8 RESISTANT Resistant     ERYTHROMYCIN >=8 RESISTANT Resistant     GENTAMICIN <=0.5 SENSITIVE Sensitive     OXACILLIN >=4 RESISTANT Resistant     TETRACYCLINE <=1 SENSITIVE Sensitive     VANCOMYCIN <=0.5 SENSITIVE Sensitive     TRIMETH/SULFA 160 RESISTANT Resistant     CLINDAMYCIN <=0.25 SENSITIVE Sensitive     RIFAMPIN <=0.5 SENSITIVE Sensitive     Inducible Clindamycin NEGATIVE Sensitive     * FEW METHICILLIN RESISTANT STAPHYLOCOCCUS AUREUS     Studies: No results found.  Scheduled Meds: . amLODipine  5 mg Oral Daily  . chlorhexidine  15 mL Mouth Rinse BID  . Chlorhexidine Gluconate Cloth  6 each Topical Daily  . clonazePAM  0.5 mg Oral TID  . docusate  100 mg Oral BID  . enoxaparin (LOVENOX) injection  40 mg Subcutaneous Q24H  . labetalol  100 mg Oral BID  . magnesium oxide  400 mg Oral BID  . mouth rinse  15 mL Mouth Rinse q12n4p  . melatonin  5 mg Oral Daily  . oxyCODONE  10 mg Oral Q8H  . pantoprazole sodium  40 mg Oral Q24H  . polyethylene glycol  17 g Oral Daily  . propranolol  20 mg Oral TID  . QUEtiapine  200 mg Oral BID  . sodium chloride flush  10-40 mL Intracatheter Q12H  . valproic acid  500 mg Oral TID   Continuous Infusions: . sodium chloride Stopped (08/21/19 0548)  . magnesium sulfate bolus IVPB      Active Problems:   Cardiac arrest (HCC)   Hyperkalemia   Acute respiratory failure with hypoxia (HCC)   Acute renal failure with tubular necrosis (HCC)   PEA (Pulseless electrical activity) (HCC)   Acute hypoxemic respiratory failure (HCC)   Acute renal failure (ARF) (HCC)   Status post tracheostomy (HCC)   Tracheostomy  tube present (HCC)   Polysubstance abuse (HCC)   Leukocytosis   Acute blood loss anemia   Anoxic brain injury (HCC)   Dysphagia causing pulmonary aspiration with swallowing   Subacute delirium   Consultants:  PCCM  Nephrology  Procedures:  ETT placement  R Chest Tube  Trach  R Femoral HD Cath  R [place] CVL  L Radial A-line   Antibiotics: Anti-infectives (From admission, onward)   Start  Dose/Rate Route Frequency Ordered Stop   08/02/19 1400  ceFEPIme (MAXIPIME) 2 g in sodium chloride 0.9 % 100 mL IVPB        2 g 200 mL/hr over 30 Minutes Intravenous Every 8 hours 08/02/19 1232 08/09/19 0615   07/31/19 1000  linezolid (ZYVOX) IVPB 600 mg  Status:  Discontinued        600 mg 300 mL/hr over 60 Minutes Intravenous Every 12 hours 07/31/19 0827 08/02/19 0736   07/31/19 0830  meropenem (MERREM) 1 g in sodium chloride 0.9 % 100 mL IVPB  Status:  Discontinued        1 g 200 mL/hr over 30 Minutes Intravenous Every 8 hours 07/31/19 0827 08/02/19 1232   07/28/19 0800  piperacillin-tazobactam (ZOSYN) IVPB 3.375 g  Status:  Discontinued        3.375 g 12.5 mL/hr over 240 Minutes Intravenous Every 8 hours 07/28/19 0759 07/31/19 0827   07/27/19 1430  linezolid (ZYVOX) IVPB 600 mg  Status:  Discontinued        600 mg 300 mL/hr over 60 Minutes Intravenous Every 12 hours 07/27/19 1418 07/28/19 0749   07/26/19 1630  piperacillin-tazobactam (ZOSYN) IVPB 2.25 g  Status:  Discontinued        2.25 g 100 mL/hr over 30 Minutes Intravenous Every 8 hours 07/26/19 1029 07/28/19 0759   07/26/19 1045  vancomycin (VANCOCIN) IVPB 1000 mg/200 mL premix        1,000 mg 200 mL/hr over 60 Minutes Intravenous  Once 07/26/19 1033 07/26/19 1214   07/26/19 0801  vancomycin variable dose per unstable renal function (pharmacist dosing)  Status:  Discontinued         Does not apply See admin instructions 07/26/19 0801 07/27/19 1418   07/26/19 0700  piperacillin-tazobactam (ZOSYN) IVPB 3.375 g  Status:   Discontinued        3.375 g 100 mL/hr over 30 Minutes Intravenous Every 6 hours 07/26/19 0656 07/26/19 1028   07/25/19 1700  ceFEPIme (MAXIPIME) 2 g in sodium chloride 0.9 % 100 mL IVPB  Status:  Discontinued        2 g 200 mL/hr over 30 Minutes Intravenous Every 24 hours 07/25/19 1642 07/26/19 0650   07/25/19 1645  vancomycin (VANCOREADY) IVPB 1500 mg/300 mL        1,500 mg 150 mL/hr over 120 Minutes Intravenous  Once 07/25/19 1642 07/25/19 1901       Time spent: 35 minutes    Albertine Grates MD PhD FACP  [facility] contact info at [email or web address] 09/06/2019, 8:11 AM  LOS: 43 days

## 2019-09-06 NOTE — Progress Notes (Signed)
SLP Cancellation Note  Patient Details Name: George Kelly MRN: [id number] DOB: 11-02-1990   Cancelled treatment:        Noted pt's agitation last night per notes and RN reported "pt tried to pick up his bed this morning." Waiting on [place] bed (apparently none available). He just had sedation and sleeping. Will continue efforts for dysphagia with upgraded solid and liquid texture.    [name] 09/06/2019, 9:52 AM  [name] [name].Ed Nurse, children's [phone] Office [phone]

## 2019-09-07 MED ORDER — ACETAMINOPHEN 325 MG PO TABS
650.0000 mg | ORAL_TABLET | ORAL | Status: DC | PRN
  Filled 2019-09-07: qty 2

## 2019-09-07 MED ORDER — IBUPROFEN 200 MG PO TABS: 400.0000 mg | ORAL_TABLET | Freq: Four times a day (QID) | ORAL | Status: DC | PRN

## 2019-09-07 MED ORDER — PROPRANOLOL HCL 20 MG/5ML PO SOLN
20.0000 mg | Freq: Three times a day (TID) | ORAL | Status: DC
  Administered 2019-09-07 – 2019-09-11 (×11): 20 mg via ORAL
  Filled 2019-09-07 (×12): qty 5

## 2019-09-07 MED ORDER — CLONAZEPAM 0.25 MG PO TBDP
0.2500 mg | ORAL_TABLET | Freq: Three times a day (TID) | ORAL | Status: DC | PRN
  Administered 2019-09-10: 0.25 mg via ORAL

## 2019-09-07 MED ORDER — SODIUM CHLORIDE 0.9% FLUSH
10.0000 mL | Freq: Two times a day (BID) | INTRAVENOUS | Status: DC
  Administered 2019-09-07: 10 mL
  Administered 2019-09-07: 20 mL
  Administered 2019-09-08 – 2019-09-12 (×10): 10 mL
  Administered 2019-09-13: 20 mL

## 2019-09-07 MED ORDER — HALOPERIDOL LACTATE 5 MG/ML IJ SOLN
1.0000 mg | Freq: Four times a day (QID) | INTRAMUSCULAR | Status: AC | PRN
  Administered 2019-09-08: 1 mg via INTRAVENOUS
  Filled 2019-09-07: qty 1

## 2019-09-07 MED ORDER — PANTOPRAZOLE SODIUM 40 MG PO TBEC
40.0000 mg | DELAYED_RELEASE_TABLET | Freq: Every day | ORAL | Status: DC
  Administered 2019-09-08 – 2019-09-13 (×6): 40 mg via ORAL
  Filled 2019-09-07 (×6): qty 1

## 2019-09-07 MED ORDER — CLONAZEPAM 0.5 MG PO TABS: 0.2500 mg | ORAL_TABLET | Freq: Three times a day (TID) | ORAL | Status: DC | PRN

## 2019-09-07 MED ORDER — OXYCODONE HCL 5 MG PO TABS
5.0000 mg | ORAL_TABLET | Freq: Three times a day (TID) | ORAL | Status: DC
  Administered 2019-09-07 – 2019-09-08 (×5): 5 mg via ORAL
  Filled 2019-09-07 (×7): qty 1

## 2019-09-07 MED ORDER — SODIUM CHLORIDE 0.9% FLUSH: 10.0000 mL | INTRAVENOUS | Status: DC | PRN

## 2019-09-07 MED ORDER — QUETIAPINE FUMARATE 100 MG PO TABS
100.0000 mg | ORAL_TABLET | Freq: Two times a day (BID) | ORAL | Status: DC
  Administered 2019-09-07 – 2019-09-10 (×6): 100 mg via ORAL
  Filled 2019-09-07 (×6): qty 1

## 2019-09-07 MED ORDER — DOCUSATE SODIUM 100 MG PO CAPS
100.0000 mg | ORAL_CAPSULE | Freq: Two times a day (BID) | ORAL | Status: DC
  Administered 2019-09-08: 100 mg via ORAL
  Filled 2019-09-07: qty 1

## 2019-09-07 NOTE — Progress Notes (Signed)
Patient PICC line placed per MD order. Patient sleeping in bed. Patient RN notified that PICC line is placed, bed is lowered into safety position and patient is sleeping in bed, call bell within reach.

## 2019-09-07 NOTE — Progress Notes (Addendum)
  Speech Language Pathology Treatment: Dysphagia  Patient Details Name: George Kelly MRN: [id number] DOB: 1990/06/16 Today's Date: 09/07/2019 Time: 7017-7939 SLP Time Calculation (min) (ACUTE ONLY): 11 min  Assessment / Plan / Recommendation Clinical Impression  Vail bed unzipped with Benson Norway repositioning himself needing frequent cues due to drowsiness- kept eyes open for brief periods of time needing max multimodal cues. He was adequately alert for a few straw sips thin and bite graham cracker with adequate oral manipulation. Delayed throat clear x 1. SLP stopped trials when pt unable to maintain alertness. Will follow with plans to observe during a meal for longer periods with regular/thin.    HPI HPI: 29 yo male found unresponsive with probable drug overdose.  PEA arrest with ROSC after about 15 minutes.  Found to have lactic acidosis, AKI from rhabdomyolysis, aspiration pneumonia, shock liver, hypothermia, acute metabolic encephalopathy 2nd to anoxia, sepsis.  UDS positive for cocaine, benzo's, THC.   ETT 6/09-6/21; 6/21 tracheostomy. Has been tolerating trach collar since 7/2; has cortrak.      SLP Plan  Continue with current plan of care       Recommendations  Diet recommendations: Regular;Thin liquid Liquids provided via: Cup;Straw Medication Administration: Whole meds with puree Supervision: Patient able to self feed;Full supervision/cueing for compensatory strategies Compensations: Minimize environmental distractions;Slow rate;Small sips/bites;Other (Comment) Postural Changes and/or Swallow Maneuvers: Seated upright 90 degrees                Oral Care Recommendations: Oral care BID Follow up Recommendations: 24 hour supervision/assistance SLP Visit Diagnosis: Dysphagia, unspecified (R13.10) Plan: Continue with current plan of care                       [name] 09/07/2019, 1:02 PM  [name] [name].Ed Presenter, broadcasting [phone] Office [phone]

## 2019-09-07 NOTE — Progress Notes (Signed)
[facility] notified that restraint order expires in 4 hrs. Ilean Skill LPN

## 2019-09-07 NOTE — Progress Notes (Signed)
[facility] PROGRESS NOTE  Erma HeritageLamar Eschete ZOX:[id number] DOB: 11/17/1990 DOA: 07/25/2019 PCP: Patient, No Pcp Per  Status: Inpatient---Remains inpatient appropriate because:Altered mental status, Unsafe d/c plan and Inpatient level of care appropriate due to severity of illness   Dispo: The patient is from: Home              Anticipated d/c is to: SNF              Anticipated d/c date is: > 3 days              Patient currently is medically stable to d/c.  Patient continues to require intermittent one-on-one supervision in restraints because of sequelae from anoxic brain injury.  He is progressing slowly with PT and OT. SLP is advanced to mechanical soft diet with nectar thick. Mother has applied for guardianship and Medicaid. [facility] lack of caregiver support after discharge therefore plan is to discharge to skilled nursing facility  Code Status: Full Family Communication: 7/20 updated patient's sister and mother by phone regarding current status -7/21 mom and sister at bedside DVT prophylaxis: Lovenox Vaccination status: Unknown  HPI: 29 yo male found unresponsive with probable drug overdose. PEA arrest with ROSC after about 15 minutes. Found to have lactic acidosis, AKI from rhabdomyolysis, aspiration pneumonia, shock liver, hypothermia. UDS positive for cocaine, benzo's, THC. Weaned off of precedex and weaning some of his sedative and psychotropic medications prior to transfer from unit.  Patient has been slowly progressing regards to improving mentation and improved respiratory status.  He was decannulated on 7/15 and is stable on room air.  Remains impulsive and must have one-to-one sitter when he is unrestrained for activity such as eating.  As of 7/21 SLP has advanced patient to regular diet with thin liquids. Restraints have been removed in favor of [place] bed.   Subjective: Awake and sitting up in bed eating breakfast. Explained to  patient the purpose of the [facility] in regards to her short-term memory deficits increased risk for falls.  Objective: Vitals:   09/06/19 2246 09/07/19 0827  BP: (!) 125/88 126/77  Pulse: 90 79  Resp:    Temp: 97.8 F (36.6 C) 98.5 F (36.9 C)  SpO2: 98% 100%    Intake/Output Summary (Last 24 hours) at 09/07/2019 1123 Last data filed at 09/07/2019 0700 Gross per 24 hour  Intake 610 ml  Output 400 ml  Net 210 ml   Filed Weights   08/31/19 0500 09/01/19 0500 09/03/19 0500  Weight: 68 kg 68 kg 74 kg    Exam: Constitutional: NAD, alert, not restless at this time Neck: normal, supple, no masses, tracheal stoma site unremarkable Respiratory: clear to auscultation bilaterally, Normal respiratory effort. No accessory muscle use.  Room air.   Cardiovascular: Regular rate and rhythm, no murmurs / rubs / gallops. No extremity edema. 2+ pedal pulses.  Abdomen: no tenderness, no masses palpated. No hepatosplenomegaly. Bowel sounds positive.  Genitourinary: Condom catheter in place with clear urine noted Musculoskeletal: no clubbing / cyanosis. No joint deformity upper and lower extremities. Good ROM, no contractures. Normal muscle tone.  Skin: no rashes, lesions, ulcers. No induration Neurologic: CN 2-12 grossly intact. Sensation intact, DTR not assessed.  Strength 5/5 x all 4 extremities.  Psychiatric: Alert and confused; oriented x name and year and partially oriented to place.   Assessment/Plan: Acute on chronic hypoxic Respiratory Failure requiring tracheostomy tube Out of hospital cardiac Arrest Aspiration Pneumonia/Enterobacter PNA -Decannulated  on 7/15-Stoma closed as of 7/21 -Alert and phonating appropriately -Stoma culture drainage obtained on 7/17 consistent with a few MRSA.  Patient without any significant symptoms: he is afebrile,WBC downward  trend to 11.8 as of 7/14.  Will obtain CBC.  Suspect colonization as opposed to active infection so will not start antibiotics at this  juncture  Dysphagia -Resolved -7/21 SLP evaluated and advance patient to regular diet with thin liquids  Nutrition Status: Nutrition Problem: Inadequate oral intake Etiology: acute illness Signs/Symptoms: recent NPO Interventions:  7/21: regular diet with thin liquids Estimated body mass index is 21.52 kg/m as calculated from the following:   Height as of this encounter: 6\' 1"  (1.854 m).   Weight as of this encounter: 74 kg.  -Patient eating 100% of diet and asking for snacks in between-also ingesting 100% of protein supplements  Fever -Resolved -Suspect related to atelectasis   Acute metabolic encephalopathy 2nd to anoxic brain injury, sepsis  Hx of substance abuse -Patient currently without withdrawal symptoms and has been continued on appropriate medications during the hospitalization -7/15 it was noted that patient performed better with PT/OT/SLP prior to receiving sedative medications therefore began titrating offending medications: -Prn Ativan stopped in favor of prn Klonopin -7/21 decreased scheduled Klonopin to 0.25 mg TID -7/23 decreased scheduled oxycodone to 5 mg q 8 hrs -7/23: Decrease Seroquel to 100 mg BID -Cont Depakene (behavior and seizure prophylaxis) -Cont prn Haldol and Benadryl for BT behavioral issues -7/23: Decrease propranolol to 20 mg TID  -High risk for fall; when awake very impulsive and becomes agitated especially in the evening therefore will need to continue 1: 1 sitter -7/21: PT rec [place] bed to help get pt out of restraints  Hypertension -BP soft so Norvasc dc'd 7/22 -Continue labetalol and consider tapering as tolerated-current SBP well controlled although heart rate averages 70s to 90s  Anemia of critical illness -Hgb stable at 10.9-would transfuse for <7.   Physical deconditioning -PT and OT following with recommendation for LTAC versus SNF -Does not have appropriate 24-hour care after discharge therefore declined by [facility] -7/19: Alert  but remained confused during entirety of session.  Able to roll side to side and get to edge of bed with minimal assist x1 and was able to maintain midline sitting with minimal guard x2 for safety.  Occasional posterior lean detected especially when he was distracted.  Mobility significantly improved as well as gait training.  He was able to ambulate 100 feet in the hallway with minimal assist x2 with hand-held assistive device but was very unsteady due to scissoring pattern and impaired balance and coordination.  Hypomagnesemia -Magnesium 1.8 after replacement  Other:   PICC line placed 7/23    Data Reviewed: Basic Metabolic Panel: Recent Labs  Lab 09/06/19 0242  NA 136  K 4.0  CL 101  CO2 25  GLUCOSE 92  BUN 10  CREATININE 0.79  CALCIUM 8.8*  MG 1.8   Liver Function Tests: Recent Labs  Lab 09/06/19 0242  AST 16  ALT 18  ALKPHOS 97  BILITOT 0.7  PROT 7.8  ALBUMIN 2.6*   No results for input(s): LIPASE, AMYLASE in the last 168 hours. Recent Labs  Lab 09/06/19 0242  AMMONIA 39*   CBC: Recent Labs  Lab 09/03/19 1344 09/06/19 0242  WBC 11.5* 8.0  NEUTROABS 6.2 3.2  HGB 10.7* 9.8*  HCT 32.1* 29.9*  MCV 83.8 84.7  PLT 261 246   Cardiac Enzymes: No results for input(s): CKTOTAL, CKMB, CKMBINDEX,  TROPONINI in the last 168 hours. BNP (last 3 results) No results for input(s): BNP in the last 8760 hours.  ProBNP (last 3 results) No results for input(s): PROBNP in the last 8760 hours.  CBG: Recent Labs  Lab 08/31/19 1641 09/02/19 0915 09/02/19 1336 09/02/19 1744 09/02/19 2032  GLUCAP 99 101* 78 81 78    Recent Results (from the past 240 hour(s))  Aerobic Culture (superficial specimen)     Status: None   Collection Time: 09/01/19  6:10 PM   Specimen: Wound; Tracheal  Result Value Ref Range Status   Specimen Description WOUND  Final   Special Requests TRACHEAL SITE  Final   Gram Stain   Final    RARE WBC PRESENT, PREDOMINANTLY PMN FEW GRAM  POSITIVE COCCI Performed at [facility], [address], [place], [place] [address]    Culture FEW METHICILLIN RESISTANT STAPHYLOCOCCUS AUREUS  Final   Report Status 09/03/2019 FINAL  Final   Organism ID, Bacteria METHICILLIN RESISTANT STAPHYLOCOCCUS AUREUS  Final      Susceptibility   Methicillin resistant staphylococcus aureus - MIC*    CIPROFLOXACIN >=8 RESISTANT Resistant     ERYTHROMYCIN >=8 RESISTANT Resistant     GENTAMICIN <=0.5 SENSITIVE Sensitive     OXACILLIN >=4 RESISTANT Resistant     TETRACYCLINE <=1 SENSITIVE Sensitive     VANCOMYCIN <=0.5 SENSITIVE Sensitive     TRIMETH/SULFA 160 RESISTANT Resistant     CLINDAMYCIN <=0.25 SENSITIVE Sensitive     RIFAMPIN <=0.5 SENSITIVE Sensitive     Inducible Clindamycin NEGATIVE Sensitive     * FEW METHICILLIN RESISTANT STAPHYLOCOCCUS AUREUS     Studies: [place] EKG SITE RITE  Result Date: 09/06/2019 If Site Rite image not attached, placement could not be confirmed due to current cardiac rhythm.   Scheduled Meds: . chlorhexidine  15 mL Mouth Rinse BID  . Chlorhexidine Gluconate Cloth  6 each Topical Daily  . clonazepam  0.25 mg Oral TID  . docusate  100 mg Oral BID  . enoxaparin (LOVENOX) injection  40 mg Subcutaneous Q24H  . magnesium oxide  400 mg Oral BID  . mouth rinse  15 mL Mouth Rinse q12n4p  . melatonin  5 mg Oral Daily  . nicotine  14 mg Transdermal Daily  . oxyCODONE  10 mg Oral Q8H  . pantoprazole sodium  40 mg Oral Q24H  . polyethylene glycol  17 g Oral Daily  . propranolol  40 mg Oral TID  . QUEtiapine  200 mg Oral BID  . sodium chloride flush  10-40 mL Intracatheter Q12H  . sodium chloride flush  10-40 mL Intracatheter Q12H  . valproic acid  500 mg Oral TID   Continuous Infusions: . sodium chloride Stopped (08/21/19 0548)    Active Problems:   Cardiac arrest (HCC)   Hyperkalemia   Acute respiratory failure with hypoxia (HCC)   Acute renal failure with tubular necrosis (HCC)   PEA (Pulseless  electrical activity) (HCC)   Acute hypoxemic respiratory failure (HCC)   Acute renal failure (ARF) (HCC)   Status post tracheostomy (HCC)   Tracheostomy tube present (HCC)   Polysubstance abuse (HCC)   Leukocytosis   Acute blood loss anemia   Anoxic brain injury (HCC)   Dysphagia causing pulmonary aspiration with swallowing   Subacute delirium   Consultants:  PCCM  Nephrology  Procedures:  ETT placement  R Chest Tube  Trach  R Femoral HD Cath  R [place] CVL  L Radial A-line  Antibiotics: Anti-infectives (From admission, onward)   Start     Dose/Rate Route Frequency Ordered Stop   08/02/19 1400  ceFEPIme (MAXIPIME) 2 g in sodium chloride 0.9 % 100 mL IVPB        2 g 200 mL/hr over 30 Minutes Intravenous Every 8 hours 08/02/19 1232 08/09/19 0615   07/31/19 1000  linezolid (ZYVOX) IVPB 600 mg  Status:  Discontinued        600 mg 300 mL/hr over 60 Minutes Intravenous Every 12 hours 07/31/19 0827 08/02/19 0736   07/31/19 0830  meropenem (MERREM) 1 g in sodium chloride 0.9 % 100 mL IVPB  Status:  Discontinued        1 g 200 mL/hr over 30 Minutes Intravenous Every 8 hours 07/31/19 0827 08/02/19 1232   07/28/19 0800  piperacillin-tazobactam (ZOSYN) IVPB 3.375 g  Status:  Discontinued        3.375 g 12.5 mL/hr over 240 Minutes Intravenous Every 8 hours 07/28/19 0759 07/31/19 0827   07/27/19 1430  linezolid (ZYVOX) IVPB 600 mg  Status:  Discontinued        600 mg 300 mL/hr over 60 Minutes Intravenous Every 12 hours 07/27/19 1418 07/28/19 0749   07/26/19 1630  piperacillin-tazobactam (ZOSYN) IVPB 2.25 g  Status:  Discontinued        2.25 g 100 mL/hr over 30 Minutes Intravenous Every 8 hours 07/26/19 1029 07/28/19 0759   07/26/19 1045  vancomycin (VANCOCIN) IVPB 1000 mg/200 mL premix        1,000 mg 200 mL/hr over 60 Minutes Intravenous  Once 07/26/19 1033 07/26/19 1214   07/26/19 0801  vancomycin variable dose per unstable renal function (pharmacist dosing)  Status:   Discontinued         Does not apply See admin instructions 07/26/19 0801 07/27/19 1418   07/26/19 0700  piperacillin-tazobactam (ZOSYN) IVPB 3.375 g  Status:  Discontinued        3.375 g 100 mL/hr over 30 Minutes Intravenous Every 6 hours 07/26/19 0656 07/26/19 1028   07/25/19 1700  ceFEPIme (MAXIPIME) 2 g in sodium chloride 0.9 % 100 mL IVPB  Status:  Discontinued        2 g 200 mL/hr over 30 Minutes Intravenous Every 24 hours 07/25/19 1642 07/26/19 0650   07/25/19 1645  vancomycin (VANCOREADY) IVPB 1500 mg/300 mL        1,500 mg 150 mL/hr over 120 Minutes Intravenous  Once 07/25/19 1642 07/25/19 1901       Time spent: 20 minutes    Junious Silk ANP  [facility] Pager [phone]. If 7PM-7AM, please contact night-coverage at [email or web address] 09/07/2019, 11:23 AM  LOS: 44 days

## 2019-09-07 NOTE — Progress Notes (Signed)
Peripherally Inserted Central Catheter Placement  The IV Nurse has discussed with the patient and/or persons authorized to consent for the patient, the purpose of this procedure and the potential benefits and risks involved with this procedure.  The benefits include less needle sticks, lab draws from the catheter, and the patient may be discharged home with the catheter. Risks include, but not limited to, infection, bleeding, blood clot (thrombus formation), and puncture of an artery; nerve damage and irregular heartbeat and possibility to perform a PICC exchange if needed/ordered by physician.  Alternatives to this procedure were also discussed.  Bard Power PICC patient education guide, fact sheet on infection prevention and patient information card has been provided to patient /or left at bedside.    PICC Placement Documentation  PICC Single Lumen 09/07/19 PICC Right Basilic 36 cm 0 cm (Active)  Indication for Insertion or Continuance of Line Prolonged intravenous therapies 09/07/19 1051  Exposed Catheter (cm) 0 cm 09/07/19 1051  Site Assessment Clean;Dry;Intact 09/07/19 1051  Line Status Blood return noted;Flushed;Saline locked 09/07/19 1051  Dressing Type Transparent;Securing device 09/07/19 1051  Dressing Status Clean;Dry;Intact;Antimicrobial disc in place 09/07/19 1051  Dressing Change Due 09/14/19 09/07/19 1051       Romie Jumper 09/07/2019, 10:54 AM

## 2019-09-08 ENCOUNTER — Inpatient Hospital Stay (HOSPITAL_COMMUNITY): Payer: Self-pay

## 2019-09-08 LAB — T.PALLIDUM AB, TOTAL: T Pallidum Abs: NONREACTIVE

## 2019-09-08 MED ORDER — SENNOSIDES-DOCUSATE SODIUM 8.6-50 MG PO TABS
1.0000 | ORAL_TABLET | Freq: Two times a day (BID) | ORAL | Status: DC
  Administered 2019-09-08 – 2019-09-13 (×7): 1 via ORAL
  Filled 2019-09-08 (×8): qty 1

## 2019-09-08 MED ORDER — CLONAZEPAM 0.25 MG PO TBDP
0.2500 mg | ORAL_TABLET | Freq: Two times a day (BID) | ORAL | Status: DC
  Administered 2019-09-08: 0.25 mg via ORAL
  Filled 2019-09-08: qty 1

## 2019-09-08 NOTE — Plan of Care (Signed)
°  Problem: Clinical Measurements: Goal: Ability to maintain clinical measurements within normal limits will improve Outcome: Progressing Goal: Will remain free from infection Outcome: Progressing Goal: Diagnostic test results will improve Outcome: Progressing Goal: Respiratory complications will improve Outcome: Progressing Goal: Cardiovascular complication will be avoided Outcome: Progressing   Problem: Coping: Goal: Level of anxiety will decrease Outcome: Progressing   Problem: Pain Managment: Goal: General experience of comfort will improve Outcome: Progressing   Problem: Activity: Goal: Ability to tolerate increased activity will improve Outcome: Progressing   Problem: Skin Integrity: Goal: Risk for impaired skin integrity will decrease Outcome: Progressing   Problem: Respiratory: Goal: Ability to maintain a clear airway and adequate ventilation will improve Outcome: Progressing

## 2019-09-08 NOTE — Progress Notes (Addendum)
[facility] PROGRESS NOTE  George Kelly ZYS:[id number] DOB: 10/19/1990 DOA: 07/25/2019 PCP: Patient, No Pcp Per  Status: Inpatient---Remains inpatient appropriate because:Altered mental status, Unsafe d/c plan and Inpatient level of care appropriate due to severity of illness   Dispo: The patient is from: Home              Anticipated d/c is to: SNF              Anticipated d/c date is: > 3 days              Patient currently is medically stable to d/c.  Patient had dramatic improvement on July 24, he expressed wishes to undergo home, mother stated she cannot take care of him. mother has applied for guardianship and Medicaid.   Was Unable to receive additional care at [facility] due to lack of caregiver support after discharge therefore plan is to discharge to skilled nursing facility, he had dramatic improvement on July 24, will get PT OT reeval.  Code Status: Full Family Communication: 7/20 updated patient's sister and mother by phone regarding current status -7/21 mom and sister at bedside, mother at bedside on July 24 DVT prophylaxis: Lovenox Vaccination status: Unknown  HPI: 29 yo male found unresponsive with probable drug overdose. PEA arrest with ROSC after about 15 minutes. Found to have lactic acidosis, AKI from rhabdomyolysis, aspiration pneumonia, shock liver, hypothermia. UDS positive for cocaine, benzo's, THC. Weaned off of precedex and weaning some of his sedative and psychotropic medications prior to transfer from unit.  Patient has been slowly progressing regards to improving mentation and improved respiratory status.  He was decannulated on 7/15 and is stable on room air.  Remains impulsive and must have one-to-one sitter when he is unrestrained for activity such as eating.  As of 7/21 SLP has advanced patient to regular diet with thin liquids. Restraints have been removed in favor of [place] bed.   Subjective: Patient showed dramatic improvement overnight, he is off  restraint,  now alert and oriented, calm and pleasant Report right shoulder pain, limited range of motion Report abdominal pain   Objective: Vitals:   09/07/19 1622 09/07/19 2040  BP: 127/85 119/78  Pulse: 85 89  Resp: 18 16  Temp: 98.7 F (37.1 C) 98.2 F (36.8 C)  SpO2: 99% 100%    Intake/Output Summary (Last 24 hours) at 09/08/2019 0758 Last data filed at 09/08/2019 0700 Gross per 24 hour  Intake 400 ml  Output 900 ml  Net -500 ml   Filed Weights   08/31/19 0500 09/01/19 0500 09/03/19 0500  Weight: 68 kg 68 kg 74 kg    Exam: Constitutional: NAD, alert, oriented x3, not restless at this time Neck: normal, supple, no masses, tracheal stoma site unremarkable Respiratory: clear to auscultation bilaterally, Normal respiratory effort. No accessory muscle use.  Room air.   Cardiovascular: Regular rate and rhythm, no murmurs / rubs / gallops. No extremity edema. 2+ pedal pulses.  Abdomen: Mild appendectomy gastric tenderness , lower abdomen tenderness , no guarding , no rebound, Bowel sounds positive.  Genitourinary: Condom catheter in place with clear urine noted Musculoskeletal: no clubbing / cyanosis.  Right shoulder limited range of motion, bilateral lower extremity generalized weakness.  Skin: no rashes, lesions, ulcers. No induration Neurologic: CN 2-12 grossly intact. Sensation intact, DTR not assessed.  Generalized weakness bilateral lower extremity, not able to lift leg against resistance Psychiatric: Alert, oriented x 3   Assessment/Plan: Acute on chronic hypoxic Respiratory Failure requiring  tracheostomy tube Out of hospital cardiac Arrest Aspiration Pneumonia/Enterobacter PNA -Decannulated on 7/15-Stoma closed as of 7/21 -Alert and phonating appropriately -Stoma culture drainage obtained on 7/17 consistent with a few MRSA.  Patient without any significant symptoms: he is afebrile, WBC normalized on July 22 suspect colonization as opposed to active infection so  will not start antibiotics at this juncture.  Dysphagia -Resolved -7/21 SLP evaluated and advance patient to regular diet with thin liquids  Nutrition Status: Nutrition Problem: Inadequate oral intake Etiology: acute illness Signs/Symptoms: recent NPO Interventions:  7/21: regular diet with thin liquids Estimated body mass index is 21.52 kg/m as calculated from the following:   Height as of this encounter: 6\' 1"  (1.854 m).   Weight as of this encounter: 74 kg.  -Patient eating 100% of diet and asking for snacks in between-also ingesting 100% of protein supplements  Fever -Resolved -Suspect related to atelectasis   Acute metabolic encephalopathy 2nd to anoxic brain injury, sepsis  Hx of substance abuse -Patient currently without withdrawal symptoms and has been continued on appropriate medications during the hospitalization -7/15 it was noted that patient performed better with PT/OT/SLP prior to receiving sedative medications therefore began titrating offending medications: -Prn Ativan stopped in favor of prn Klonopin -7/24 decreased scheduled Klonopin to 0.25 mg  bid -7/23 decreased scheduled oxycodone to 5 mg q 8 hrs -7/23: Decrease Seroquel to 100 mg BID -Cont Depakene (behavior and seizure prophylaxis) -Cont prn Haldol and Benadryl for BT behavioral issues -7/23: Decrease propranolol to 20 mg TID  -High risk for fall; when awake very impulsive and becomes agitated especially in the evening therefore will need to continue 1: 1 sitter -7/21: PT rec [place] bed to help get pt out of restraints  Hypertension -BP soft so Norvasc dc'd 7/22, DC labetalol on July 27 -Continue propranolol and consider tapering as tolerated-current SBP well controlled although heart rate averages 70s to 90s  Anemia of critical illness -Hgb stable at 10.9-would transfuse for <7.   Physical deconditioning -PT and OT following with recommendation for LTAC versus SNF -Does not have appropriate  24-hour care after discharge therefore declined by CIR -He was able to ambulate 100 feet in the hallway with minimal assist x2 with hand-held assistive device but was very unsteady due to scissoring pattern and impaired balance and coordination. -Significant mental status improvement on July 24, continue PT eval, may be able to reconsider CRI placement patient has appropriate post discharge care, patient report he is going to live with his mom after discharge  Hypomagnesemia -Magnesium 1.8 after replacement  abnormal LFT, has normalized on 7/22  Mild elevated ammonia level at 39 in the setting of normal lft  may be related to valproic acid, monitor ammonia level , consider taper valproic acid if ammonia level trend up.  Right shoulder pain We will get right shoulder x-ray  Abdominal pain  will get KUB  Cigarette smoking; nicotine patch provided  Other:   PICC line placed right arm 7/23    Data Reviewed: Basic Metabolic Panel: Recent Labs  Lab 09/06/19 0242  NA 136  K 4.0  CL 101  CO2 25  GLUCOSE 92  BUN 10  CREATININE 0.79  CALCIUM 8.8*  MG 1.8   Liver Function Tests: Recent Labs  Lab 09/06/19 0242  AST 16  ALT 18  ALKPHOS 97  BILITOT 0.7  PROT 7.8  ALBUMIN 2.6*   No results for input(s): LIPASE, AMYLASE in the last 168 hours. Recent Labs  Lab 09/06/19  0242  AMMONIA 39*   CBC: Recent Labs  Lab 09/03/19 1344 09/06/19 0242  WBC 11.5* 8.0  NEUTROABS 6.2 3.2  HGB 10.7* 9.8*  HCT 32.1* 29.9*  MCV 83.8 84.7  PLT 261 246   Cardiac Enzymes: No results for input(s): CKTOTAL, CKMB, CKMBINDEX, TROPONINI in the last 168 hours. BNP (last 3 results) No results for input(s): BNP in the last 8760 hours.  ProBNP (last 3 results) No results for input(s): PROBNP in the last 8760 hours.  CBG: Recent Labs  Lab 09/02/19 0915 09/02/19 1336 09/02/19 1744 09/02/19 2032  GLUCAP 101* 78 81 78    Recent Results (from the past 240 hour(s))  Aerobic Culture  (superficial specimen)     Status: None   Collection Time: 09/01/19  6:10 PM   Specimen: Wound; Tracheal  Result Value Ref Range Status   Specimen Description WOUND  Final   Special Requests TRACHEAL SITE  Final   Gram Stain   Final    RARE WBC PRESENT, PREDOMINANTLY PMN FEW GRAM POSITIVE COCCI Performed at [facility], [address] [place], [place] [address]    Culture FEW METHICILLIN RESISTANT STAPHYLOCOCCUS AUREUS  Final   Report Status 09/03/2019 FINAL  Final   Organism ID, Bacteria METHICILLIN RESISTANT STAPHYLOCOCCUS AUREUS  Final      Susceptibility   Methicillin resistant staphylococcus aureus - MIC*    CIPROFLOXACIN >=8 RESISTANT Resistant     ERYTHROMYCIN >=8 RESISTANT Resistant     GENTAMICIN <=0.5 SENSITIVE Sensitive     OXACILLIN >=4 RESISTANT Resistant     TETRACYCLINE <=1 SENSITIVE Sensitive     VANCOMYCIN <=0.5 SENSITIVE Sensitive     TRIMETH/SULFA 160 RESISTANT Resistant     CLINDAMYCIN <=0.25 SENSITIVE Sensitive     RIFAMPIN <=0.5 SENSITIVE Sensitive     Inducible Clindamycin NEGATIVE Sensitive     * FEW METHICILLIN RESISTANT STAPHYLOCOCCUS AUREUS     Studies: US EKG SITE RITE  Result Date: 09/06/2019 If Site Rite image not attached, placement could not be confirmed due to current cardiac rhythm.   Scheduled Meds: . chlorhexidine  15 mL Mouth Rinse BID  . Chlorhexidine Gluconate Cloth  6 each Topical Daily  . clonazepam  0.25 mg Oral TID  . docusate sodium  100 mg Oral BID  . enoxaparin (LOVENOX) injection  40 mg Subcutaneous Q24H  . magnesium oxide  400 mg Oral BID  . mouth rinse  15 mL Mouth Rinse q12n4p  . melatonin  5 mg Oral Daily  . nicotine  14 mg Transdermal Daily  . oxyCODONE  5 mg Oral Q8H  . pantoprazole  40 mg Oral Daily  . polyethylene glycol  17 g Oral Daily  . propranolol  20 mg Oral TID  . QUEtiapine  100 mg Oral BID  . sodium chloride flush  10-40 mL Intracatheter Q12H  . sodium chloride flush  10-40 mL Intracatheter Q12H   . valproic acid  500 mg Oral TID   Continuous Infusions: . sodium chloride Stopped (08/21/19 0548)    Active Problems:   Cardiac arrest (HCC)   Hyperkalemia   Acute respiratory failure with hypoxia (HCC)   Acute renal failure with tubular necrosis (HCC)   PEA (Pulseless electrical activity) (HCC)   Acute hypoxemic respiratory failure (HCC)   Acute renal failure (ARF) (HCC)   Status post tracheostomy (HCC)   Tracheostomy tube present (HCC)   Polysubstance abuse (HCC)   Leukocytosis   Acute blood loss anemia   Anoxic  brain injury [facility])   Dysphagia causing pulmonary aspiration with swallowing   Subacute delirium   Consultants:  PCCM  Nephrology  Procedures:  ETT placement  R Chest Tube  Trach  R Femoral HD Cath  R [place] CVL  L Radial A-line   Antibiotics: Anti-infectives (From admission, onward)   Start     Dose/Rate Route Frequency Ordered Stop   08/02/19 1400  ceFEPIme (MAXIPIME) 2 g in sodium chloride 0.9 % 100 mL IVPB        2 g 200 mL/hr over 30 Minutes Intravenous Every 8 hours 08/02/19 1232 08/09/19 0615   07/31/19 1000  linezolid (ZYVOX) IVPB 600 mg  Status:  Discontinued        600 mg 300 mL/hr over 60 Minutes Intravenous Every 12 hours 07/31/19 0827 08/02/19 0736   07/31/19 0830  meropenem (MERREM) 1 g in sodium chloride 0.9 % 100 mL IVPB  Status:  Discontinued        1 g 200 mL/hr over 30 Minutes Intravenous Every 8 hours 07/31/19 0827 08/02/19 1232   07/28/19 0800  piperacillin-tazobactam (ZOSYN) IVPB 3.375 g  Status:  Discontinued        3.375 g 12.5 mL/hr over 240 Minutes Intravenous Every 8 hours 07/28/19 0759 07/31/19 0827   07/27/19 1430  linezolid (ZYVOX) IVPB 600 mg  Status:  Discontinued        600 mg 300 mL/hr over 60 Minutes Intravenous Every 12 hours 07/27/19 1418 07/28/19 0749   07/26/19 1630  piperacillin-tazobactam (ZOSYN) IVPB 2.25 g  Status:  Discontinued        2.25 g 100 mL/hr over 30 Minutes Intravenous Every 8 hours 07/26/19  1029 07/28/19 0759   07/26/19 1045  vancomycin (VANCOCIN) IVPB 1000 mg/200 mL premix        1,000 mg 200 mL/hr over 60 Minutes Intravenous  Once 07/26/19 1033 07/26/19 1214   07/26/19 0801  vancomycin variable dose per unstable renal function (pharmacist dosing)  Status:  Discontinued         Does not apply See admin instructions 07/26/19 0801 07/27/19 1418   07/26/19 0700  piperacillin-tazobactam (ZOSYN) IVPB 3.375 g  Status:  Discontinued        3.375 g 100 mL/hr over 30 Minutes Intravenous Every 6 hours 07/26/19 0656 07/26/19 1028   07/25/19 1700  ceFEPIme (MAXIPIME) 2 g in sodium chloride 0.9 % 100 mL IVPB  Status:  Discontinued        2 g 200 mL/hr over 30 Minutes Intravenous Every 24 hours 07/25/19 1642 07/26/19 0650   07/25/19 1645  vancomycin (VANCOREADY) IVPB 1500 mg/300 mL        1,500 mg 150 mL/hr over 120 Minutes Intravenous  Once 07/25/19 1642 07/25/19 1901       Time spent: 35 minutes  Albertine Grates MD PhD FACP          [facility] contact info at [email or web address]  09/08/2019, 7:58 AM  LOS: 45 days

## 2019-09-08 NOTE — Progress Notes (Signed)
Pt is up to chair for breakfast and lunch feeds self. Took shower in shower chair with standby assistance.  Mother is at bedside now.

## 2019-09-09 MED ORDER — CLONAZEPAM 0.25 MG PO TBDP
0.2500 mg | ORAL_TABLET | Freq: Every day | ORAL | Status: DC
  Administered 2019-09-09: 0.25 mg via ORAL
  Filled 2019-09-09 (×2): qty 1

## 2019-09-09 MED ORDER — POLYETHYLENE GLYCOL 3350 17 G PO PACK
17.0000 g | PACK | Freq: Two times a day (BID) | ORAL | Status: DC
  Administered 2019-09-09 – 2019-09-10 (×2): 17 g via ORAL
  Filled 2019-09-09 (×4): qty 1

## 2019-09-09 MED ORDER — OXYCODONE HCL 5 MG PO TABS
5.0000 mg | ORAL_TABLET | Freq: Four times a day (QID) | ORAL | Status: DC | PRN
  Administered 2019-09-09 – 2019-09-13 (×8): 5 mg via ORAL
  Filled 2019-09-09 (×8): qty 1

## 2019-09-09 NOTE — Progress Notes (Signed)
[facility] PROGRESS NOTE  George Kelly UUV:[id number] DOB: 1990/03/06 DOA: 07/25/2019 PCP: Patient, No Pcp Per  Status: Inpatient---Remains inpatient appropriate because:Altered mental status, Unsafe d/c plan and Inpatient level of care appropriate due to severity of illness   Dispo: The patient is from: Home              Anticipated d/c is to: Pending PT reeval              Anticipated d/c date is: Possible Monday              Patient currently is medically stable to d/c.   mother stated she cannot take care of him. mother has applied for guardianship and Medicaid. Patient had dramatic improvement on July 24, request PT reeval   Code Status: Full Family Communication: 7/20 updated patient's sister and mother by phone regarding current status -7/21 mom and sister at bedside, mother at bedside on July 24 DVT prophylaxis: Lovenox Vaccination status: Unknown  HPI: 29 yo male found unresponsive with probable drug overdose. PEA arrest with ROSC after about 15 minutes. Found to have lactic acidosis, AKI from rhabdomyolysis, aspiration pneumonia, shock liver, hypothermia. UDS positive for cocaine, benzo's, THC. Weaned off of precedex and weaning some of his sedative and psychotropic medications prior to transfer from unit.  Patient has been slowly progressing regards to improving mentation and improved respiratory status.  He was decannulated on 7/15 and is stable on room air.  Remains impulsive and must have one-to-one sitter when he is unrestrained for activity such as eating.  As of 7/21 SLP has advanced patient to regular diet with thin liquids. Restraints have been removed in favor of [place] bed.   Subjective:  He has improved a lot over the weekend, is now calm and cooperative, alert and oriented x3  He told me he is not a habitual drug user, he overdosed because his baby died. He told me he does not use narcotics regularly in the past, he agreed to change oxycodone to as  needed   Objective: Vitals:   09/08/19 0824 09/08/19 1935  BP: (!) 143/98 (!) 115/56  Pulse: 86 89  Resp: 16 20  Temp: 98.2 F (36.8 C) 98 F (36.7 C)  SpO2: 99% 100%    Intake/Output Summary (Last 24 hours) at 09/09/2019 0743 Last data filed at 09/08/2019 0829 Gross per 24 hour  Intake --  Output 1000 ml  Net -1000 ml   Filed Weights   08/31/19 0500 09/01/19 0500 09/03/19 0500  Weight: 68 kg 68 kg 74 kg    Exam: Constitutional: NAD, alert, oriented x3, calm and cooperative Neck: normal, supple, no masses, tracheal stoma site unremarkable Respiratory: clear to auscultation bilaterally, Normal respiratory effort. No accessory muscle use.  Room air.   Cardiovascular: Regular rate and rhythm, no murmurs / rubs / gallops. No extremity edema. 2+ pedal pulses.  Abdomen: Mild appendectomy gastric tenderness , lower abdomen tenderness , no guarding , no rebound, Bowel sounds positive.  Genitourinary: Condom catheter in place with clear urine noted Musculoskeletal: no clubbing / cyanosis.  Right shoulder limited range of motion has improved, bilateral lower extremity generalized weakness has improved Skin: no rashes, lesions, ulcers. No induration Neurologic: CN 2-12 grossly intact. Sensation intact, DTR not assessed.  Generalized weakness bilateral lower extremity has improved, not able to lift against resistance Psychiatric: Alert, oriented x 3   Assessment/Plan: Acute on chronic hypoxic Respiratory Failure requiring tracheostomy tube Out of hospital cardiac Arrest Aspiration Pneumonia/Enterobacter  PNA -Decannulated on 7/15-Stoma closed as of 7/21 -Alert and phonating appropriately -Stoma culture drainage obtained on 7/17 consistent with a few MRSA.  Patient without any significant symptoms: he is afebrile, WBC normalized on July 22 suspect colonization as opposed to active infection so will not start antibiotics at this juncture.  Dysphagia -Resolved -7/21 SLP evaluated  and advance patient to regular diet with thin liquids  Nutrition Status: Nutrition Problem: Inadequate oral intake Etiology: acute illness Signs/Symptoms: recent NPO Interventions:  7/21: regular diet with thin liquids Estimated body mass index is 21.52 kg/m as calculated from the following:   Height as of this encounter: 6\' 1"  (1.854 m).   Weight as of this encounter: 74 kg.  -Patient eating 100% of diet and asking for snacks in between-also ingesting 100% of protein supplements  Fever -Resolved -Suspect related to atelectasis   Acute metabolic encephalopathy 2nd to anoxic brain injury, sepsis  Hx of substance abuse -Patient currently without withdrawal symptoms and has been continued on appropriate medications during the hospitalization- --7/25 decreased scheduled Klonopin to 0.25 mg  To daily with goal to stop in 24-48hrs -7/25 change scheduled oxycodone to 5 mg q 8 hrs to prn -7/23: Decrease Seroquel to 100 mg BID -Cont Depakene (behavior and seizure prophylaxis) -Cont prn Haldol and Benadryl for BT behavioral issues -7/23: Decrease propranolol to 20 mg TID  -Improving, AAOx3, calm and cooperative off restrain, PT reeval  Hypertension -not on meds PTA -Continue propranolol and consider tapering as tolerated-current SBP well controlled although heart rate averages 70s to 90s  Anemia of critical illness -Hgb stable at 10.9-would transfuse for <7.   Physical deconditioning --Does not have appropriate 24-hour care after discharge therefore declined by CIR -- mother not able to take care of him if he is too weak, however patient has improved drastically over the weekend, will request PT reeval  Hypomagnesemia -Magnesium 1.8 after replacement  abnormal LFT, has normalized on 7/22  Mild elevated ammonia level at 39 in the setting of normal lft  may be related to valproic acid, monitor ammonia level , consider taper valproic acid if ammonia level trend up.  Right  shoulder pain  right shoulder x-ray no acute findings, range of motion improving  Abdominal pain  KUB showed constipation, increased stool softener, increase activity  Cigarette smoking; nicotine patch provided  Other:   PICC line placed right arm 7/23    Data Reviewed: Basic Metabolic Panel: Recent Labs  Lab 09/06/19 0242  NA 136  K 4.0  CL 101  CO2 25  GLUCOSE 92  BUN 10  CREATININE 0.79  CALCIUM 8.8*  MG 1.8   Liver Function Tests: Recent Labs  Lab 09/06/19 0242  AST 16  ALT 18  ALKPHOS 97  BILITOT 0.7  PROT 7.8  ALBUMIN 2.6*   No results for input(s): LIPASE, AMYLASE in the last 168 hours. Recent Labs  Lab 09/06/19 0242  AMMONIA 39*   CBC: Recent Labs  Lab 09/03/19 1344 09/06/19 0242  WBC 11.5* 8.0  NEUTROABS 6.2 3.2  HGB 10.7* 9.8*  HCT 32.1* 29.9*  MCV 83.8 84.7  PLT 261 246   Cardiac Enzymes: No results for input(s): CKTOTAL, CKMB, CKMBINDEX, TROPONINI in the last 168 hours. BNP (last 3 results) No results for input(s): BNP in the last 8760 hours.  ProBNP (last 3 results) No results for input(s): PROBNP in the last 8760 hours.  CBG: Recent Labs  Lab 09/02/19 0915 09/02/19 1336 09/02/19 1744 09/02/19 2032  GLUCAP  101* 78 81 78    Recent Results (from the past 240 hour(s))  Aerobic Culture (superficial specimen)     Status: None   Collection Time: 09/01/19  6:10 PM   Specimen: Wound; Tracheal  Result Value Ref Range Status   Specimen Description WOUND  Final   Special Requests TRACHEAL SITE  Final   Gram Stain   Final    RARE WBC PRESENT, PREDOMINANTLY PMN FEW GRAM POSITIVE COCCI Performed at [facility], [address], [place], [place] [address]    Culture FEW METHICILLIN RESISTANT STAPHYLOCOCCUS AUREUS  Final   Report Status 09/03/2019 FINAL  Final   Organism ID, Bacteria METHICILLIN RESISTANT STAPHYLOCOCCUS AUREUS  Final      Susceptibility   Methicillin resistant staphylococcus aureus - MIC*    CIPROFLOXACIN  >=8 RESISTANT Resistant     ERYTHROMYCIN >=8 RESISTANT Resistant     GENTAMICIN <=0.5 SENSITIVE Sensitive     OXACILLIN >=4 RESISTANT Resistant     TETRACYCLINE <=1 SENSITIVE Sensitive     VANCOMYCIN <=0.5 SENSITIVE Sensitive     TRIMETH/SULFA 160 RESISTANT Resistant     CLINDAMYCIN <=0.25 SENSITIVE Sensitive     RIFAMPIN <=0.5 SENSITIVE Sensitive     Inducible Clindamycin NEGATIVE Sensitive     * FEW METHICILLIN RESISTANT STAPHYLOCOCCUS AUREUS     Studies: DG Shoulder Right  Result Date: 09/08/2019 CLINICAL DATA:  Right shoulder pain, no known injury EXAM: RIGHT SHOULDER - 2+ VIEW COMPARISON:  None. FINDINGS: There is no evidence of fracture or dislocation. There is no evidence of arthropathy or other focal bone abnormality. Right upper extremity PICC. IMPRESSION: No fracture or dislocation of the right shoulder. Joint spaces are well preserved. No radiographic abnormality of the right shoulder. Electronically Signed   By: Lauralyn Primes M.D.   On: 09/08/2019 13:31   DG Abd 1 View  Result Date: 09/08/2019 CLINICAL DATA:  Overdose, RIGHT shoulder and abdominal pain, altered level of consciousness EXAM: ABDOMEN - 1 VIEW COMPARISON:  Portable exam 1117 hours compared to 08/17/2019 FINDINGS: Increased stool throughout colon. Small bowel gas pattern normal. Slight gaseous distention of stomach. Osseous structures unremarkable. No urinary tract calcifications. IMPRESSION: Increased stool throughout colon question constipation. Electronically Signed   By: Ulyses Southward M.D.   On: 09/08/2019 13:48    Scheduled Meds: . chlorhexidine  15 mL Mouth Rinse BID  . Chlorhexidine Gluconate Cloth  6 each Topical Daily  . clonazepam  0.25 mg Oral BID  . enoxaparin (LOVENOX) injection  40 mg Subcutaneous Q24H  . magnesium oxide  400 mg Oral BID  . mouth rinse  15 mL Mouth Rinse q12n4p  . melatonin  5 mg Oral Daily  . nicotine  14 mg Transdermal Daily  . oxyCODONE  5 mg Oral Q8H  . pantoprazole  40 mg Oral  Daily  . polyethylene glycol  17 g Oral Daily  . propranolol  20 mg Oral TID  . QUEtiapine  100 mg Oral BID  . senna-docusate  1 tablet Oral BID  . sodium chloride flush  10-40 mL Intracatheter Q12H  . sodium chloride flush  10-40 mL Intracatheter Q12H  . valproic acid  500 mg Oral TID   Continuous Infusions: . sodium chloride Stopped (08/21/19 0548)    Active Problems:   Cardiac arrest (HCC)   Hyperkalemia   Acute respiratory failure with hypoxia (HCC)   Acute renal failure with tubular necrosis (HCC)   PEA (Pulseless electrical activity) (HCC)   Acute hypoxemic  respiratory failure (HCC)   Acute renal failure (ARF) (HCC)   Status post tracheostomy (HCC)   Tracheostomy tube present (HCC)   Polysubstance abuse (HCC)   Leukocytosis   Acute blood loss anemia   Anoxic brain injury (HCC)   Dysphagia causing pulmonary aspiration with swallowing   Subacute delirium   Consultants:  PCCM  Nephrology  Procedures:  ETT placement  R Chest Tube  Trach  R Femoral HD Cath  R [place] CVL  L Radial A-line   Antibiotics: Anti-infectives (From admission, onward)   Start     Dose/Rate Route Frequency Ordered Stop   08/02/19 1400  ceFEPIme (MAXIPIME) 2 g in sodium chloride 0.9 % 100 mL IVPB        2 g 200 mL/hr over 30 Minutes Intravenous Every 8 hours 08/02/19 1232 08/09/19 0615   07/31/19 1000  linezolid (ZYVOX) IVPB 600 mg  Status:  Discontinued        600 mg 300 mL/hr over 60 Minutes Intravenous Every 12 hours 07/31/19 0827 08/02/19 0736   07/31/19 0830  meropenem (MERREM) 1 g in sodium chloride 0.9 % 100 mL IVPB  Status:  Discontinued        1 g 200 mL/hr over 30 Minutes Intravenous Every 8 hours 07/31/19 0827 08/02/19 1232   07/28/19 0800  piperacillin-tazobactam (ZOSYN) IVPB 3.375 g  Status:  Discontinued        3.375 g 12.5 mL/hr over 240 Minutes Intravenous Every 8 hours 07/28/19 0759 07/31/19 0827   07/27/19 1430  linezolid (ZYVOX) IVPB 600 mg  Status:  Discontinued         600 mg 300 mL/hr over 60 Minutes Intravenous Every 12 hours 07/27/19 1418 07/28/19 0749   07/26/19 1630  piperacillin-tazobactam (ZOSYN) IVPB 2.25 g  Status:  Discontinued        2.25 g 100 mL/hr over 30 Minutes Intravenous Every 8 hours 07/26/19 1029 07/28/19 0759   07/26/19 1045  vancomycin (VANCOCIN) IVPB 1000 mg/200 mL premix        1,000 mg 200 mL/hr over 60 Minutes Intravenous  Once 07/26/19 1033 07/26/19 1214   07/26/19 0801  vancomycin variable dose per unstable renal function (pharmacist dosing)  Status:  Discontinued         Does not apply See admin instructions 07/26/19 0801 07/27/19 1418   07/26/19 0700  piperacillin-tazobactam (ZOSYN) IVPB 3.375 g  Status:  Discontinued        3.375 g 100 mL/hr over 30 Minutes Intravenous Every 6 hours 07/26/19 0656 07/26/19 1028   07/25/19 1700  ceFEPIme (MAXIPIME) 2 g in sodium chloride 0.9 % 100 mL IVPB  Status:  Discontinued        2 g 200 mL/hr over 30 Minutes Intravenous Every 24 hours 07/25/19 1642 07/26/19 0650   07/25/19 1645  vancomycin (VANCOREADY) IVPB 1500 mg/300 mL        1,500 mg 150 mL/hr over 120 Minutes Intravenous  Once 07/25/19 1642 07/25/19 1901       Time spent: 35 minutes  Albertine GratesFang Leanore Biggers MD PhD FACP          [facility] contact info at [email or web address]  09/09/2019, 7:43 AM  LOS: 46 days

## 2019-09-10 ENCOUNTER — Encounter (HOSPITAL_COMMUNITY): Payer: Self-pay | Admitting: Pulmonary Disease

## 2019-09-10 DIAGNOSIS — T50901A Poisoning by unspecified drugs, medicaments and biological substances, accidental (unintentional), initial encounter: Secondary | ICD-10-CM | POA: Diagnosis present

## 2019-09-10 DIAGNOSIS — T40602A Poisoning by unspecified narcotics, intentional self-harm, initial encounter: Principal | ICD-10-CM

## 2019-09-10 DIAGNOSIS — F1020 Alcohol dependence, uncomplicated: Secondary | ICD-10-CM

## 2019-09-10 LAB — COMPREHENSIVE METABOLIC PANEL
ALT: 21 U/L (ref 0–44)
AST: 22 U/L (ref 15–41)
Albumin: 2.6 g/dL — ABNORMAL LOW (ref 3.5–5.0)
Alkaline Phosphatase: 88 U/L (ref 38–126)
Anion gap: 8 (ref 5–15)
BUN: <5 mg/dL — ABNORMAL LOW (ref 6–20)
CO2: 26 mmol/L (ref 22–32)
Calcium: 8.5 mg/dL — ABNORMAL LOW (ref 8.9–10.3)
Chloride: 101 mmol/L (ref 98–111)
Creatinine, Ser: 0.77 mg/dL (ref 0.61–1.24)
GFR calc Af Amer: >60 mL/min (ref >60)
GFR calc non Af Amer: >60 mL/min (ref >60)
Glucose, Bld: 108 mg/dL — ABNORMAL HIGH (ref 70–99)
Potassium: 3.4 mmol/L — ABNORMAL LOW (ref 3.5–5.1)
Sodium: 135 mmol/L (ref 135–145)
Total Bilirubin: 0.4 mg/dL (ref 0.3–1.2)
Total Protein: 7.5 g/dL (ref 6.5–8.1)

## 2019-09-10 LAB — CBC WITH DIFFERENTIAL/PLATELET
Abs Immature Granulocytes: 0.04 10*3/uL (ref 0.00–0.07)
Basophils Absolute: 0.1 10*3/uL (ref 0.0–0.1)
Basophils Relative: 1 %
Eosinophils Absolute: 0.8 10*3/uL — ABNORMAL HIGH (ref 0.0–0.5)
Eosinophils Relative: 10 %
HCT: 33.5 % — ABNORMAL LOW (ref 39.0–52.0)
Hemoglobin: 10.7 g/dL — ABNORMAL LOW (ref 13.0–17.0)
Immature Granulocytes: 1 %
Lymphocytes Relative: 29 %
Lymphs Abs: 2.2 10*3/uL (ref 0.7–4.0)
MCH: 27.2 pg (ref 26.0–34.0)
MCHC: 31.9 g/dL (ref 30.0–36.0)
MCV: 85.2 fL (ref 80.0–100.0)
Monocytes Absolute: 0.7 10*3/uL (ref 0.1–1.0)
Monocytes Relative: 10 %
Neutro Abs: 3.7 10*3/uL (ref 1.7–7.7)
Neutrophils Relative %: 49 %
Platelets: 224 10*3/uL (ref 150–400)
RBC: 3.93 MIL/uL — ABNORMAL LOW (ref 4.22–5.81)
RDW: 14.9 % (ref 11.5–15.5)
WBC: 7.5 10*3/uL (ref 4.0–10.5)
nRBC: 0 % (ref 0.0–0.2)

## 2019-09-10 LAB — AMMONIA: Ammonia: 29 umol/L (ref 9–35)

## 2019-09-10 LAB — MAGNESIUM: Magnesium: 1.4 mg/dL — ABNORMAL LOW (ref 1.7–2.4)

## 2019-09-10 MED ORDER — QUETIAPINE FUMARATE 25 MG PO TABS
25.0000 mg | ORAL_TABLET | Freq: Two times a day (BID) | ORAL | Status: DC
  Administered 2019-09-10 – 2019-09-13 (×6): 25 mg via ORAL
  Filled 2019-09-10 (×6): qty 1

## 2019-09-10 NOTE — Consult Note (Deleted)
  Attempted to see patient twice.  Patient sleeping; called name several times and shook the bed and patient would not wake.  Went back to patient 30 minutes later and still couldn't wake.  Spoke with patients nurse who states that patient staring off looking at ceiling and appeared to be crying.  Provider will re-attempt to assess patient tomorrow.    Informed Almon Hercules, MD could not get patient to wake after making 2 attempts and a provider would try to see again tomorrow.

## 2019-09-10 NOTE — Progress Notes (Signed)
Physical Therapy Treatment/Re-Evaluation Patient Details Name: George Kelly MRN: [id number] DOB: Mar 11, 1990 Today's Date: 09/10/2019    History of Present Illness 29 yo male found unresponsive with probable drug overdose.  PEA arrest with ROSC after about 15 minutes.  Found to have lactic acidosis, AKI from rhabdomyolysis, aspiration pneumonia, shock liver, hypothermia.  UDS positive for cocaine, benzo's, THC.   6/17 tracheostomy.    PT Comments    Patient received sitting up in chair, very pleasant and actually A&Ox4 today! Followed commands well and was calm and appropriate with therapy, asking when can he be released from the hospital. Able to easily mobilize in his room with full independence, did need general supervision/occasional light min guard in the hallway when distracted but mobility has improved greatly! Currently do not feel that he is in need of skilled PT services after DC, and again cognition has improved significantly but per nursing report does seem to have occasional mild confusion. Have consulted with speech for more in detail assessment of cognition/true need for 24/7 S at DC. Left sitting in bed with all needs met this morning. Physically able to go home with support from family when medically ready at this point.    Follow Up Recommendations  No PT follow up;Other (comment) (have consulted with speech therapy about true need for 24/7 S)     Equipment Recommendations  None recommended by PT    Recommendations for Other Services       Precautions / Restrictions Precautions Precautions: Fall Precaution Comments: fall, intermittent confusion but agitation has significantly improved!! Restrictions Weight Bearing Restrictions: No    Mobility  Bed Mobility Overal bed mobility: Independent             General bed mobility comments: no physical assist given  Transfers Overall transfer level: Needs assistance Equipment used: None Transfers: Sit to/from  Stand Sit to Stand: Supervision         General transfer comment: distant S for all mobility, no unsteadiness or imbalance noted  Ambulation/Gait Ambulation/Gait assistance: Supervision Gait Distance (Feet): 400 Feet Assistive device: None Gait Pattern/deviations: Step-through pattern;Narrow base of support;Drifts right/left Gait velocity: decreased but improving   General Gait Details: mobility MUCH improved, able to gait train in hallway with close S and occasional Min guard. Much more steady and safe with gait.   Stairs             Wheelchair Mobility    Modified Rankin (Stroke Patients Only)       Balance Overall balance assessment: Mild deficits observed, not formally tested Sitting-balance support: No upper extremity supported;Feet supported Sitting balance-Leahy Scale: Normal     Standing balance support: No upper extremity supported;During functional activity Standing balance-Leahy Scale: Good                              Cognition Arousal/Alertness: Awake/alert Behavior During Therapy: Flat affect Overall Cognitive Status: Within Functional Limits for tasks assessed Area of Impairment: Attention;Following commands;Safety/judgement;Awareness;Problem solving                   Current Attention Level: Selective   Following Commands: Follows one step commands consistently   Awareness: Emergent   General Comments: cognition and behavior MUCH better today! A&Ox4 with PT and no significant confusion/agitation noted      Exercises      General Comments        Pertinent Vitals/Pain Pain Assessment: Faces Pain Score: 0-No  pain Faces Pain Scale: No hurt Pain Intervention(s): Monitored during session    Home Living                      Prior Function            PT Goals (current goals can now be found in the care plan section) Acute Rehab PT Goals Patient Stated Goal: find phone PT Goal Formulation: With  patient Time For Goal Achievement: 09/20/19 Potential to Achieve Goals: Fair Progress towards PT goals: Progressing toward goals    Frequency    Min 3X/week      PT Plan Discharge plan needs to be updated    Co-evaluation              AM-PAC PT "6 Clicks" Mobility   Outcome Measure  Help needed turning from your back to your side while in a flat bed without using bedrails?: None Help needed moving from lying on your back to sitting on the side of a flat bed without using bedrails?: None Help needed moving to and from a bed to a chair (including a wheelchair)?: None Help needed standing up from a chair using your arms (e.g., wheelchair or bedside chair)?: None Help needed to walk in hospital room?: None Help needed climbing 3-5 steps with a railing? : A Little 6 Click Score: 23    End of Session Equipment Utilized During Treatment: Gait belt Activity Tolerance: Patient tolerated treatment well Patient left: in chair;with call bell/phone within reach Nurse Communication: Mobility status;Other (comment) (will ask speech therapy to re-eval as well for deeper breakdown of cogntion) PT Visit Diagnosis: Muscle weakness (generalized) (M62.81);Difficulty in walking, not elsewhere classified (R26.2);Other abnormalities of gait and mobility (R26.89)     Time: 9753-0051 PT Time Calculation (min) (ACUTE ONLY): 10 min  Charges:  $Gait Training: 8-22 mins                     Windell Norfolk, DPT, PN1   Supplemental Physical Therapist [place]    Pager [phone] Acute Rehab Office [phone]

## 2019-09-10 NOTE — Consult Note (Signed)
[facility] Face-to-Face Psychiatry Consult   Reason for Consult:  overdose Referring Physician:  Russella Dar, NP Patient Identification: Sukhdeep Wieting MRN:  [id number] Principal Diagnosis: Overdose, accidental or unintentional, initial encounter Diagnosis:  Principal Problem:   Overdose, accidental or unintentional, initial encounter Active Problems:   Cardiac arrest (HCC)   Hyperkalemia   Acute respiratory failure with hypoxia (HCC)   Acute renal failure with tubular necrosis (HCC)   PEA (Pulseless electrical activity) (HCC)   Acute hypoxemic respiratory failure (HCC)   Acute renal failure (ARF) (HCC)   Status post tracheostomy (HCC)   Tracheostomy tube present (HCC)   Polysubstance abuse (HCC)   Leukocytosis   Acute blood loss anemia   Anoxic brain injury (HCC)   Dysphagia causing pulmonary aspiration with swallowing   Subacute delirium   Overdose of opiate or related narcotic, intentional self-harm, initial encounter (HCC)   Alcohol use disorder, moderate, dependence (HCC)   Total Time spent with patient: 30 minutes  Subjective:  Per Hospitalist Progress Note Brief Summary: Zebediah Beezley is a 29 y.o. male with no known medical history found unresponsive at home after partying, possible drug overdose.  Suffered cardiac arrest on the field with approximate downtime of 15 minutes.  Admitted to ICU in severe shock, AKI, hyperkalemia, acidosis  HPI:  Erma Heritage, 28 y.o., male patient seen face to face by this provider, consulted with Dr. Lucianne Muss; and chart reviewed on 09/10/19.  On evaluation Reiss Mowrey reports he was brought to the hospital after an accidental overdose.  Patient states he has been depressed and did start drinking heavier after his girlfriend aborted their 34 month old unborn child.  "I had slowed down on drinking but on that day when I found out about the baby I just got drunk.  I was hanging out with some friends.  I don't know all of the drugs I did.  I know I  was drinking and smoking weed, done some cocaine and just kept drinking.  I was doing what every drugs they was doing and I don't know all of what I done.  I wasn't trying to kill myself just getting high."  Patient states that he had his own place but will leave the hospital to go home with his mother for one month and then he and his girlfriend are planing to get a place together.  "We don't know if we are going to stay in [place] or [place] where she is now.  I feel like I need to got to [place]  where I won't be so familiar with the area and it will help with the drug use."  Patient states that he is willing to speak with peer support for outpatient substance use services.  "I don't mind getting help; anything that will make me stronger.  But I know that I have to want to stop on my own and want to make changes and that is one reason I'm thinking about moving to [place].  But no matter where you live if you want to drink and do drugs you can find it; but I want to make a change and get my life back on track." During evaluation Marin Milley is alert/oriented x 4; calm/cooperative; and mood is congruent with affect.  He does not appear to be responding to internal/external stimuli or delusional thoughts.  Patient denies suicidal/self-harm/homicidal ideation, psychosis, and paranoia.  Patient answered question appropriately.  Patient reporting that he has spoken to his mother since he has been in  hospital and that she is aware of him wanting to stay there for a month; if unable to stay there he can move to [place] with his girlfriend.     Past Psychiatric History: Patient denies prior psychiatric history other than alcohol use and polysubstance abuse Risk to Self:  No Risk to Others:  No Prior Inpatient Therapy:  No Prior Outpatient Therapy:  No  Past Medical History:  Past Medical History:  Diagnosis Date   Cardiac arrest (HCC)    Polysubstance abuse (HCC)    History reviewed. No  pertinent surgical history. Family History: History reviewed. No pertinent family history. Family Psychiatric  History: Denies Social History:  Social History   Substance and Sexual Activity  Alcohol Use None     Social History   Substance and Sexual Activity  Drug Use Not on file    Social History   Socioeconomic History   Marital status: Single    Spouse name: Not on file   Number of children: Not on file   Years of education: Not on file   Highest education level: Not on file  Occupational History   Not on file  Tobacco Use   Smoking status: Not on file  Substance and Sexual Activity   Alcohol use: Not on file   Drug use: Not on file   Sexual activity: Not on file  Other Topics Concern   Not on file  Social History Narrative   Not on file   Social Determinants of Health   Financial Resource Strain:    Difficulty of Paying Living Expenses:   Food Insecurity:    Worried About Running Out of Food in the Last Year:    Barista in the Last Year:   Transportation Needs:    Freight forwarder (Medical):    Lack of Transportation (Non-Medical):   Physical Activity:    Days of Exercise per Week:    Minutes of Exercise per Session:   Stress:    Feeling of Stress :   Social Connections:    Frequency of Communication with Friends and Family:    Frequency of Social Gatherings with Friends and Family:    Attends Religious Services:    Active Member of Clubs or Organizations:    Attends Banker Meetings:    Marital Status:    Additional Social History:    Allergies:   Allergies  Allergen Reactions   Peanut-Containing Drug Products     Labs:  Results for orders placed or performed during the hospital encounter of 07/25/19 (from the past 48 hour(s))  CBC with Differential/Platelet     Status: Abnormal   Collection Time: 09/10/19  8:33 AM  Result Value Ref Range   WBC 7.5 4.0 - 10.5 K/uL   RBC 3.93 (L) 4.22 -  5.81 MIL/uL   Hemoglobin 10.7 (L) 13.0 - 17.0 g/dL   HCT 16.1 (L) 39 - 52 %   MCV 85.2 80.0 - 100.0 fL   MCH 27.2 26.0 - 34.0 pg   MCHC 31.9 30.0 - 36.0 g/dL   RDW 09.6 04.5 - 40.9 %   Platelets 224 150 - 400 K/uL   nRBC 0.0 0.0 - 0.2 %   Neutrophils Relative % 49 %   Neutro Abs 3.7 1.7 - 7.7 K/uL   Lymphocytes Relative 29 %   Lymphs Abs 2.2 0.7 - 4.0 K/uL   Monocytes Relative 10 %   Monocytes Absolute 0.7 0 - 1 K/uL  Eosinophils Relative 10 %   Eosinophils Absolute 0.8 (H) 0 - 0 K/uL   Basophils Relative 1 %   Basophils Absolute 0.1 0 - 0 K/uL   Immature Granulocytes 1 %   Abs Immature Granulocytes 0.04 0.00 - 0.07 K/uL    Comment: Performed at [facility], [address] [place], [place] [address]  Comprehensive metabolic panel     Status: Abnormal   Collection Time: 09/10/19  8:33 AM  Result Value Ref Range   Sodium 135 135 - 145 mmol/L   Potassium 3.4 (L) 3.5 - 5.1 mmol/L   Chloride 101 98 - 111 mmol/L   CO2 26 22 - 32 mmol/L   Glucose, Bld 108 (H) 70 - 99 mg/dL    Comment: Glucose reference range applies only to samples taken after fasting for at least 8 hours.   BUN <5 (L) 6 - 20 mg/dL   Creatinine, Ser 9.560.77 0.61 - 1.24 mg/dL   Calcium 8.5 (L) 8.9 - 10.3 mg/dL   Total Protein 7.5 6.5 - 8.1 g/dL   Albumin 2.6 (L) 3.5 - 5.0 g/dL   AST 22 15 - 41 U/L   ALT 21 0 - 44 U/L   Alkaline Phosphatase 88 38 - 126 U/L   Total Bilirubin 0.4 0.3 - 1.2 mg/dL   GFR calc non Af Amer >60 >60 mL/min   GFR calc Af Amer >60 >60 mL/min   Anion gap 8 5 - 15    Comment: Performed at [facility], [address] [place], [place] [address]  Magnesium     Status: Abnormal   Collection Time: 09/10/19  8:33 AM  Result Value Ref Range   Magnesium 1.4 (L) 1.7 - 2.4 mg/dL    Comment: Performed at [facility], [address] [place], [place] [address]  Ammonia     Status: None   Collection Time: 09/10/19  8:33 AM  Result Value Ref Range   Ammonia 29 9 - 35 umol/L     Comment: Performed at [facility], [address] [place], [place] [address]    Current Facility-Administered Medications  Medication Dose Route Frequency Provider Last Rate Last Admin   0.9 %  sodium chloride infusion   Intravenous PRN Lorin GlassSmith, Daniel C, MD   Stopped at 08/21/19 [phone]   acetaminophen (TYLENOL) tablet 650 mg  650 mg Oral Q4H PRN Russella DarEllis, Allison L, NP       albuterol (PROVENTIL) (2.5 MG/3ML) 0.083% nebulizer solution 2.5 mg  2.5 mg Nebulization Q2H PRN Raymon Muttonavis, Whitney F, NP   2.5 mg at 08/11/19 1944   chlorhexidine (PERIDEX) 0.12 % solution 15 mL  15 mL Mouth Rinse BID Kyle, Tyrone A, DO   15 mL at 09/10/19 0835   Chlorhexidine Gluconate Cloth 2 % PADS 6 each  6 each Topical Daily Mannam, Praveen, MD   6 each at 09/10/19 0838   clonazePAM (KLONOPIN) disintegrating tablet 0.25 mg  0.25 mg Oral TID PRN Russella DarEllis, Allison L, NP   0.25 mg at 09/10/19 [id number]   diphenhydrAMINE (BENADRYL) injection 12.5 mg  12.5 mg Intravenous Q8H PRN Albertine GratesXu, Fang, MD   12.5 mg at 09/08/19 1247   enoxaparin (LOVENOX) injection 40 mg  40 mg Subcutaneous Q24H Lorin GlassSmith, Daniel C, MD   40 mg at 09/10/19 0835   ibuprofen (ADVIL) tablet 400 mg  400 mg Oral Q6H PRN Russella DarEllis, Allison L, NP       magnesium oxide (MAG-OX) tablet  400 mg  400 mg Oral BID Russella Dar, NP   400 mg at 09/10/19 8185   MEDLINE mouth rinse  15 mL Mouth Rinse q12n4p Kyle, Tyrone A, DO   15 mL at 09/09/19 1109   melatonin tablet 5 mg  5 mg Oral Daily Russella Dar, NP   5 mg at 09/09/19 1948   nicotine (NICODERM CQ - dosed in mg/24 hours) patch 14 mg  14 mg Transdermal Daily Albertine Grates, MD   14 mg at 09/10/19 0847   ondansetron (ZOFRAN) injection 4 mg  4 mg Intravenous Q6H PRN Raymon Mutton F, NP   4 mg at 08/17/19 2331   oxyCODONE (Oxy IR/ROXICODONE) immediate release tablet 5 mg  5 mg Oral Q6H PRN Albertine Grates, MD   5 mg at 09/10/19 0548   pantoprazole (PROTONIX) EC tablet 40 mg  40 mg Oral Daily Russella Dar, NP   40 mg at  09/10/19 0834   polyethylene glycol (MIRALAX / GLYCOLAX) packet 17 g  17 g Oral BID Albertine Grates, MD   17 g at 09/10/19 0841   propranolol (INDERAL) 20 MG/5ML solution 20 mg  20 mg Oral TID Russella Dar, NP   20 mg at 09/10/19 1545   QUEtiapine (SEROQUEL) tablet 25 mg  25 mg Oral BID Russella Dar, NP       senna-docusate (Senokot-S) tablet 1 tablet  1 tablet Oral BID Albertine Grates, MD   1 tablet at 09/10/19 6314   sodium chloride flush (NS) 0.9 % injection 10-40 mL  10-40 mL Intracatheter Q12H Briant Sites, DO   10 mL at 09/08/19 0813   sodium chloride flush (NS) 0.9 % injection 10-40 mL  10-40 mL Intracatheter Q12H Albertine Grates, MD   10 mL at 09/10/19 0840   sodium chloride flush (NS) 0.9 % injection 10-40 mL  10-40 mL Intracatheter PRN Albertine Grates, MD       valproic acid (DEPAKENE) 250 MG/5ML solution 500 mg  500 mg Oral TID Russella Dar, NP   500 mg at 09/10/19 1546    Musculoskeletal: Strength & Muscle Tone: within normal limits Gait & Station: normal Patient leans: N/A  Psychiatric Specialty Exam: Physical Exam Vitals and nursing note reviewed. Exam conducted with a chaperone present.  Constitutional:      Appearance: Normal appearance.  Pulmonary:     Effort: Pulmonary effort is normal.  Musculoskeletal:        General: Normal range of motion.  Skin:    General: Skin is warm and dry.  Neurological:     Mental Status: He is alert.  Psychiatric:        Mood and Affect: Mood and affect normal.        Speech: Speech normal.        Behavior: Behavior normal. Behavior is cooperative.        Thought Content: Thought content normal. Thought content is not delusional. Thought content does not include suicidal ideation.        Cognition and Memory: Cognition and memory normal.        Judgment: Judgment normal.     Review of Systems  Psychiatric/Behavioral: Negative for agitation, behavioral problems, decreased concentration, hallucinations, self-injury, sleep disturbance  and suicidal ideas. The patient is not nervous/anxious.        Patient reporting his main problem is substance use and is willing to speak with peer support and social work to set up with services.  Patient denies  suicide attempt and stated he was just getting high with friends    Blood pressure 97/82, pulse 92, temperature 98.3 F (36.8 C), temperature source Oral, resp. rate 12, height 6\' 1"  (1.854 m), weight 74 kg, SpO2 99 %.Body mass index is 21.52 kg/m.  General Appearance: Casual  Eye Contact:  Good  Speech:  Clear and Coherent and Normal Rate  Volume:  Normal  Mood:  "I'm good"  Affect:  Appropriate and Congruent  Thought Process:  Coherent, Goal Directed and Descriptions of Associations: Intact  Orientation:  Full (Time, Place, and Person)  Thought Content:  WDL and Logical  Suicidal Thoughts:  No  Homicidal Thoughts:  No  Memory:  Immediate;   Good Recent;   Good  Judgement:  Intact  Insight:  Present  Psychomotor Activity:  Normal  Concentration:  Concentration: Good and Attention Span: Good  Recall:  Good  Fund of Knowledge:  Good  Language:  Good  Akathisia:  No  Handed:  Right  AIMS (if indicated):     Assets:  Communication Skills Desire for Improvement Housing Social Support  ADL's:  Intact  Cognition:  WNL  Sleep:      Assessment:  Patient reporting that overdose was not intentional and just getting high with friends after finding out that girlfriend has aborted their child.  Reports has worked things out with girlfriend and wanting to get his life on track to stop the alcohol and illicit drug use.  Reporting if he is unable to go to parents house after discharge can live with girlfriend which was the plan anyway.  Patient denies suicidal/self-harm/homicidal ideation, psychosis, and paranoia.  Will order peer support and social work consults to assist in setting up outpatient psychiatric and substance use services.   Treatment Plan Summary: Plan Psychiatrically  clear; order peer support and social work to assist with outpatient psychiatric and substancue use services  Disposition:  Psychiatrically cleared No evidence of imminent risk to self or others at present.   Patient does not meet criteria for psychiatric inpatient admission. Supportive therapy provided about ongoing stressors. Refer to IOP. Discussed crisis plan, support from social network, calling 911, coming to the Emergency Department, and calling Suicide Hotline.  Message sent to , MD informing of recommendations and disposition.     Kolbi Altadonna, NP 09/10/2019 4:39 PM

## 2019-09-10 NOTE — Progress Notes (Addendum)
[facility] PROGRESS NOTE  George Kelly WUJ:[id number] DOB: April 06, 1990 DOA: 07/25/2019 PCP: Patient, No Pcp Per  Status: Inpatient---Remains inpatient appropriate because:Inpatient level of care appropriate due to severity of illness and need to taper and dc psych meds, needs IP psych clearance before dc due to report of intentional OD PTA   Dispo: The patient is from: Home              Anticipated d/c is to: SNF              Anticipated d/c date is: > 3 days              Patient currently is not medically stable to d/c.  Patient continues to require inpatient titration of psych medications.  Since his mentation has recovered to baseline he now admits to taking an intentional narcotic overdose due to severe depression and grief after girlfriend aborted his baby at 5 months gestation.  Currently is denying suicidal ideation but for completeness of evaluation have asked for formal psychiatric consultation prior to discharge home.  Family concerned over patient possibly having undiagnosed bipolar disorder given significant family history of same.  Also does not have safe discharge plan noting none of family in the area can take him in after discharge, patient also lost his apartment while he was hospitalized and his only option at this point is to move to [place] and live with his girlfriend (not same girlfriend that had abortion)  Code Status: Full Family Communication: 7/26 sister updated by telephone regarding plans for psychiatric consultation and possible but not confirmed discharge on Wednesday if safe discharge plan can be obtained-plan to contact mother later today during her break from work DVT prophylaxis: Lovenox Vaccination status: Unknown  HPI: 29 yo male found unresponsive with probable drug overdose. PEA arrest with ROSC after about 15 minutes. Found to have lactic acidosis, AKI from rhabdomyolysis, aspiration pneumonia, shock liver, hypothermia. UDS positive for cocaine,  benzo's, THC. Weaned off of precedex and weaning some of his sedative and psychotropic medications prior to transfer from unit.  Patient haD been slowly progressing regards to improving mentation and improved respiratory status.  He was decannulated on 7/15 and is stable on room air.  Remains impulsive and must have one-to-one sitter when he is unrestrained for activity such as eating.  As of 7/21 SLP has advanced patient to regular diet with thin liquids. Restraints have been removed in favor of [place] bed.  Over the weekend beginning on 7/25 patient had significant improvement in mentation.  He is now alert and oriented.  He is able to ambulate independently without any gait disturbances.  He was able to shower.  He admitted to the attending physician that he had intentionally taken a narcotic overdose due to severe grief and depression over the loss of a child-girlfriend aborted baby at 5 months gestation.  Currently denying suicidal ideation and states he has good support at home with his family and girlfriend.   Subjective: Awake and oriented x3. Patient self-conscious over lack of appropriate grooming stating he normally does not have a beard and that his hair is "out of control". Denies suicidal ideation, states "I have good support at home with my mom and assist and my girl"  Objective: Vitals:   09/09/19 2100 09/10/19 0751  BP: (!) 136/67 (!) 137/96  Pulse: 92 92  Resp: 12   Temp: 98 F (36.7 C) 97.8 F (36.6 C)  SpO2: 98% 100%    Intake/Output Summary (  Last 24 hours) at 09/10/2019 1100 Last data filed at 09/10/2019 0500 Gross per 24 hour  Intake --  Output 1750 ml  Net -1750 ml   Filed Weights   08/31/19 0500 09/01/19 0500 09/03/19 0500  Weight: 68 kg 68 kg 74 kg    Exam: Constitutional: NAD, alert, not restless at this time Neck: normal, supple, no masses, tracheal stoma site  Respiratory: clear to auscultation bilaterally, Normal respiratory effort. No accessory muscle  use.  Room air.   Cardiovascular: Regular rate and rhythm, no murmurs / rubs / gallops. No extremity edema. 2+ pedal pulses.  Abdomen: no tenderness, no masses palpated. No hepatosplenomegaly. Bowel sounds positive.  Musculoskeletal: no clubbing / cyanosis. No joint deformity upper and lower extremities. Good ROM, no contractures. Normal muscle tone.  Skin: no rashes, lesions, ulcers. No induration Neurologic: CN 2-12 grossly intact. Sensation intact, DTR not assessed.  Strength 5/5 x all 4 extremities.  Ambulates without any ambulatory dysfunction or assistive device. Psychiatric: Alert and oriented x4.   Assessment/Plan: Acute on chronic hypoxic Respiratory Failure requiring tracheostomy tube Out of hospital cardiac Arrest Aspiration Pneumonia/Enterobacter PNA -Decannulated on 7/15-Stoma closed as of 7/21 -Alert and phonating appropriately -Stoma culture drainage obtained on 7/17 consistent with a few MRSA.  Patient without any significant symptoms: he is afebrile,WBC downward  trend to 11.8 as of 7/14.  Will obtain CBC.  Suspect colonization as opposed to active infection so will not start antibiotics at this juncture  Intentional OD/family history of bipolar disorder -Patient admits to overdose although apparently has been telling family that he does not remember. -Family reports a longstanding history of alcohol abuse with significant mood changes during ingestion of alcohol -Patient apparently had stopped consuming alcohol while awaiting the birth of his girlfriends baby.  When this girlfriend aborted the fetus at 5 months gestation this apparently was a triggering event for patient at which point he began drinking heavily and using drugs and eventually performed intentional overdose prior to arrival. -Family also reports significant family history of multiple members with bipolar disorder and suspect patient has undiagnosed bipolar disorder -Currently we are tapering his benzodiazepines  and Seroquel but are also awaiting formal diagnosis from psychiatry consultation regarding appropriate diagnosis and medication recommendations -Patient also does not have safe discharge disposition at this juncture given preadmission history and lack of insight into why he presented to the hospital to begin with despite denying suicidal ideation.  As documented above local family are unable to take patient in and he lost his apartment while hospitalized.  His only option is to discharge to [place] with his new girlfriend -See below under metabolic encephalopathy regarding medication adjustments.  Dysphagia -Resolved -7/21 SLP evaluated and advance patient to regular diet with thin liquids  Nutrition Status: Nutrition Problem: Inadequate oral intake Etiology: acute illness Signs/Symptoms: recent NPO Interventions:  7/21: regular diet with thin liquids Estimated body mass index is 21.52 kg/m as calculated from the following:   Height as of this encounter:  (1.854 m).   Weight as of this encounter: 74 kg.  -Patient eating 100% of diet and asking for snacks in between-also ingesting 100% of protein supplements  Fever -Resolved -Suspect related to atelectasis   Acute metabolic encephalopathy 2nd to anoxic brain injury, sepsis  Hx of substance abuse -Patient currently without withdrawal symptoms and has been continued on appropriate medications during the hospitalization -7/15 it was noted that patient performed better with PT/OT/SLP prior to receiving sedative medications therefore began titrating offending  medications: -Prn Ativan stopped in favor of prn Klonopin -7/25 decreased scheduled Klonopin to 0.25 mg daily and as of 7/26 have discontinued -7/24 changed scheduled oxycodone to 5 mg q 8 hrs prn -7/26: Decreased Seroquel to 25 mg BID but may need to change this medication or increase dosage based on psychiatry recommendations -Cont Depakene (behavior and seizure  prophylaxis) -Cont prn Haldol and Benadryl for BT behavioral issues -7/23: Decreased propranolol to 20 mg TID  -As of 7/25 patient has become mostly alert and oriented x3-4 although at times does have some mild confusion.  He is ambulating without difficulty  Hypertension -BP soft so Norvasc dc'd 7/22 -Continue labetalol and consider tapering as tolerated-current SBP well controlled although heart rate averages 70s to 90s  Anemia of critical illness -Hgb stable at 10.9-would transfuse for <7.   Physical deconditioning -Now ambulating without difficulty -Above regarding improved mentation  Hypomagnesemia -Magnesium 1.8 after replacement  Other:   PICC line placed 7/23    Data Reviewed: Basic Metabolic Panel: Recent Labs  Lab 09/06/19 0242 09/10/19 0833  NA 136 135  K 4.0 3.4*  CL 101 101  CO2 25 26  GLUCOSE 92 108*  BUN 10 <5*  CREATININE 0.79 0.77  CALCIUM 8.8* 8.5*  MG 1.8 1.4*   Liver Function Tests: Recent Labs  Lab 09/06/19 0242 09/10/19 0833  AST 16 22  ALT 18 21  ALKPHOS 97 88  BILITOT 0.7 0.4  PROT 7.8 7.5  ALBUMIN 2.6* 2.6*   No results for input(s): LIPASE, AMYLASE in the last 168 hours. Recent Labs  Lab 09/06/19 0242 09/10/19 0833  AMMONIA 39* 29   CBC: Recent Labs  Lab 09/03/19 1344 09/06/19 0242 09/10/19 0833  WBC 11.5* 8.0 7.5  NEUTROABS 6.2 3.2 3.7  HGB 10.7* 9.8* 10.7*  HCT 32.1* 29.9* 33.5*  MCV 83.8 84.7 85.2  PLT 261 246 224   Cardiac Enzymes: No results for input(s): CKTOTAL, CKMB, CKMBINDEX, TROPONINI in the last 168 hours. BNP (last 3 results) No results for input(s): BNP in the last 8760 hours.  ProBNP (last 3 results) No results for input(s): PROBNP in the last 8760 hours.  CBG: No results for input(s): GLUCAP in the last 168 hours.  Recent Results (from the past 240 hour(s))  Aerobic Culture (superficial specimen)     Status: None   Collection Time: 09/01/19  6:10 PM   Specimen: Wound; Tracheal  Result  Value Ref Range Status   Specimen Description WOUND  Final   Special Requests TRACHEAL SITE  Final   Gram Stain   Final    RARE WBC PRESENT, PREDOMINANTLY PMN FEW GRAM POSITIVE COCCI Performed at [facility], [address] [place], [place] [address]    Culture FEW METHICILLIN RESISTANT STAPHYLOCOCCUS AUREUS  Final   Report Status 09/03/2019 FINAL  Final   Organism ID, Bacteria METHICILLIN RESISTANT STAPHYLOCOCCUS AUREUS  Final      Susceptibility   Methicillin resistant staphylococcus aureus - MIC*    CIPROFLOXACIN >=8 RESISTANT Resistant     ERYTHROMYCIN >=8 RESISTANT Resistant     GENTAMICIN <=0.5 SENSITIVE Sensitive     OXACILLIN >=4 RESISTANT Resistant     TETRACYCLINE <=1 SENSITIVE Sensitive     VANCOMYCIN <=0.5 SENSITIVE Sensitive     TRIMETH/SULFA 160 RESISTANT Resistant     CLINDAMYCIN <=0.25 SENSITIVE Sensitive     RIFAMPIN <=0.5 SENSITIVE Sensitive     Inducible Clindamycin NEGATIVE Sensitive     * FEW METHICILLIN RESISTANT STAPHYLOCOCCUS  AUREUS     Studies: DG Shoulder Right  Result Date: 09/08/2019 CLINICAL DATA:  Right shoulder pain, no known injury EXAM: RIGHT SHOULDER - 2+ VIEW COMPARISON:  None. FINDINGS: There is no evidence of fracture or dislocation. There is no evidence of arthropathy or other focal bone abnormality. Right upper extremity PICC. IMPRESSION: No fracture or dislocation of the right shoulder. Joint spaces are well preserved. No radiographic abnormality of the right shoulder. Electronically Signed   By: Lauralyn Primes M.D.   On: 09/08/2019 13:31   DG Abd 1 View  Result Date: 09/08/2019 CLINICAL DATA:  Overdose, RIGHT shoulder and abdominal pain, altered level of consciousness EXAM: ABDOMEN - 1 VIEW COMPARISON:  Portable exam 1117 hours compared to 08/17/2019 FINDINGS: Increased stool throughout colon. Small bowel gas pattern normal. Slight gaseous distention of stomach. Osseous structures unremarkable. No urinary tract calcifications.  IMPRESSION: Increased stool throughout colon question constipation. Electronically Signed   By: Ulyses Southward M.D.   On: 09/08/2019 13:48    Scheduled Meds: . chlorhexidine  15 mL Mouth Rinse BID  . Chlorhexidine Gluconate Cloth  6 each Topical Daily  . enoxaparin (LOVENOX) injection  40 mg Subcutaneous Q24H  . magnesium oxide  400 mg Oral BID  . mouth rinse  15 mL Mouth Rinse q12n4p  . melatonin  5 mg Oral Daily  . nicotine  14 mg Transdermal Daily  . pantoprazole  40 mg Oral Daily  . polyethylene glycol  17 g Oral BID  . propranolol  20 mg Oral TID  . QUEtiapine  25 mg Oral BID  . senna-docusate  1 tablet Oral BID  . sodium chloride flush  10-40 mL Intracatheter Q12H  . sodium chloride flush  10-40 mL Intracatheter Q12H  . valproic acid  500 mg Oral TID   Continuous Infusions: . sodium chloride Stopped (08/21/19 0548)    Active Problems:   Cardiac arrest (HCC)   Hyperkalemia   Acute respiratory failure with hypoxia (HCC)   Acute renal failure with tubular necrosis (HCC)   PEA (Pulseless electrical activity) (HCC)   Acute hypoxemic respiratory failure (HCC)   Acute renal failure (ARF) (HCC)   Status post tracheostomy (HCC)   Tracheostomy tube present (HCC)   Polysubstance abuse (HCC)   Leukocytosis   Acute blood loss anemia   Anoxic brain injury (HCC)   Dysphagia causing pulmonary aspiration with swallowing   Subacute delirium   Consultants:  PCCM  Nephrology  Procedures:  ETT placement  R Chest Tube  Trach  R Femoral HD Cath  R [place] CVL  L Radial A-line   Antibiotics: Anti-infectives (From admission, onward)   Start     Dose/Rate Route Frequency Ordered Stop   08/02/19 1400  ceFEPIme (MAXIPIME) 2 g in sodium chloride 0.9 % 100 mL IVPB        2 g 200 mL/hr over 30 Minutes Intravenous Every 8 hours 08/02/19 1232 08/09/19 0615   07/31/19 1000  linezolid (ZYVOX) IVPB 600 mg  Status:  Discontinued        600 mg 300 mL/hr over 60 Minutes Intravenous Every  12 hours 07/31/19 0827 08/02/19 0736   07/31/19 0830  meropenem (MERREM) 1 g in sodium chloride 0.9 % 100 mL IVPB  Status:  Discontinued        1 g 200 mL/hr over 30 Minutes Intravenous Every 8 hours 07/31/19 0827 08/02/19 1232   07/28/19 0800  piperacillin-tazobactam (ZOSYN) IVPB 3.375 g  Status:  Discontinued  3.375 g 12.5 mL/hr over 240 Minutes Intravenous Every 8 hours 07/28/19 0759 07/31/19 0827   07/27/19 1430  linezolid (ZYVOX) IVPB 600 mg  Status:  Discontinued        600 mg 300 mL/hr over 60 Minutes Intravenous Every 12 hours 07/27/19 1418 07/28/19 0749   07/26/19 1630  piperacillin-tazobactam (ZOSYN) IVPB 2.25 g  Status:  Discontinued        2.25 g 100 mL/hr over 30 Minutes Intravenous Every 8 hours 07/26/19 1029 07/28/19 0759   07/26/19 1045  vancomycin (VANCOCIN) IVPB 1000 mg/200 mL premix        1,000 mg 200 mL/hr over 60 Minutes Intravenous  Once 07/26/19 1033 07/26/19 1214   07/26/19 0801  vancomycin variable dose per unstable renal function (pharmacist dosing)  Status:  Discontinued         Does not apply See admin instructions 07/26/19 0801 07/27/19 1418   07/26/19 0700  piperacillin-tazobactam (ZOSYN) IVPB 3.375 g  Status:  Discontinued        3.375 g 100 mL/hr over 30 Minutes Intravenous Every 6 hours 07/26/19 0656 07/26/19 1028   07/25/19 1700  ceFEPIme (MAXIPIME) 2 g in sodium chloride 0.9 % 100 mL IVPB  Status:  Discontinued        2 g 200 mL/hr over 30 Minutes Intravenous Every 24 hours 07/25/19 1642 07/26/19 0650   07/25/19 1645  vancomycin (VANCOREADY) IVPB 1500 mg/300 mL        1,500 mg 150 mL/hr over 120 Minutes Intravenous  Once 07/25/19 1642 07/25/19 1901       Time spent: 25 minutes    Junious Silk ANP  [facility] Pager [phone]. If 7PM-7AM, please contact night-coverage at [email or web address] 09/10/2019, 11:00 AM  LOS: 47 days

## 2019-09-10 NOTE — Plan of Care (Signed)
progressing 

## 2019-09-11 DIAGNOSIS — T50901A Poisoning by unspecified drugs, medicaments and biological substances, accidental (unintentional), initial encounter: Secondary | ICD-10-CM

## 2019-09-11 MED ORDER — ESCITALOPRAM OXALATE 10 MG PO TABS
10.0000 mg | ORAL_TABLET | Freq: Every day | ORAL | Status: DC
  Administered 2019-09-11 – 2019-09-13 (×3): 10 mg via ORAL
  Filled 2019-09-11 (×3): qty 1

## 2019-09-11 MED ORDER — MAGNESIUM SULFATE 2 GM/50ML IV SOLN
2.0000 g | Freq: Once | INTRAVENOUS | Status: AC
  Administered 2019-09-11: 2 g via INTRAVENOUS
  Filled 2019-09-11: qty 50

## 2019-09-11 MED ORDER — ENSURE ENLIVE PO LIQD
237.0000 mL | Freq: Two times a day (BID) | ORAL | Status: DC
  Administered 2019-09-12 – 2019-09-13 (×3): 237 mL via ORAL

## 2019-09-11 MED ORDER — PROPRANOLOL HCL 10 MG PO TABS
10.0000 mg | ORAL_TABLET | Freq: Two times a day (BID) | ORAL | Status: DC
  Administered 2019-09-11 – 2019-09-13 (×4): 10 mg via ORAL
  Filled 2019-09-11 (×6): qty 1

## 2019-09-11 MED ORDER — VALPROIC ACID 250 MG/5ML PO SOLN
125.0000 mg | Freq: Three times a day (TID) | ORAL | Status: DC
  Administered 2019-09-11 – 2019-09-13 (×6): 125 mg via ORAL
  Filled 2019-09-11 (×8): qty 5

## 2019-09-11 NOTE — Progress Notes (Signed)
  Speech Language Pathology Treatment: Dysphagia  Patient Details Name: George Kelly MRN: [id number] DOB: January 09, 1991 Today's Date: 09/11/2019 Time: [id number] SLP Time Calculation (min) (ACUTE ONLY): 12 min  Assessment / Plan / Recommendation Clinical Impression  Significant improvements in all areas today. He was independent with consumption of regular texture, thin water via straw with no concern for aspiration. Nutritional meds should be met safely. Discussed safety once discharged and anticipatory awareness in light of a 7 week hospital stay with significant cognitive deficits that were compounded by sedation. He is showing evidence of executive functioning and independently generated a "to do" list for when he is discharged. Despite improvements, it is recommended that he have full supervision as he transitions to home life/ADL's and work at some point.    HPI HPI: 29 yo male found unresponsive with probable drug overdose.  PEA arrest with ROSC after about 15 minutes.  Found to have lactic acidosis, AKI from rhabdomyolysis, aspiration pneumonia, shock liver, hypothermia, acute metabolic encephalopathy 2nd to anoxia, sepsis.  UDS positive for cocaine, benzo's, THC.   ETT 6/09-6/21; 6/21 tracheostomy. Has been tolerating trach collar since 7/2; has cortrak.      SLP Plan  All goals met;Discharge SLP treatment due to (comment)       Recommendations  Diet recommendations: Regular;Thin liquid Liquids provided via: Cup;Straw Medication Administration: Whole meds with liquid Supervision: Patient able to self feed Compensations: Slow rate;Small sips/bites Postural Changes and/or Swallow Maneuvers: Seated upright 90 degrees                Oral Care Recommendations: Oral care BID Follow up Recommendations: 24 hour supervision/assistance SLP Visit Diagnosis: Dysphagia, unspecified (R13.10) Plan: All goals met;Discharge SLP treatment due to (comment)       GO                 Houston Siren 09/11/2019, 1:18 PM

## 2019-09-11 NOTE — Progress Notes (Addendum)
Pt started walking around down the hall off unit. This nurse asked pt what was he doing and that he needed to stay on the unit. Pt stated that he walked out earlier with staff and wanted to walk girlfriend to front door. I asked him to stay there and asked secretary to call public safety. He was by himself at first then girlfriend walked up. Paged on call doctor and he stated as long as someone was with him it is okay, but by the time I returned him and girlfriend are gone. CNA Colin Mulders went down the hall to find him but she is unsuccessful.  AC David called and gave him update and he stated as long as security is aware and  Pt is in right mind nothing else can be done.   Attempted to call security again @2010  but no answer.   Spoke with in security@ 2015 and I gave a brief description of pt. He stated that he had one and that they did a search of the area and will do a search of the parking lot. He asked me to try and contact the girlfriend or other family that may know her.   Pt and girlfriend arrived back to room @2035 . Public safety made aware. Instructions given to pt that he can't leave floor unless staff is with him. Pt stated that he understood.

## 2019-09-11 NOTE — Progress Notes (Signed)
Nutrition Follow-up  DOCUMENTATION CODES:   Not applicable  INTERVENTION:    Ensure Enlive po BID, each supplement provides 350 kcal and 20 grams of protein  MVI daily   NUTRITION DIAGNOSIS:   Inadequate oral intake related to acute illness as evidenced by NPO status.  Diet advanced   GOAL:   Patient will meet greater than or equal to 90% of their needs  Progressing   MONITOR:   Vent status, Labs, Weight trends, TF tolerance  REASON FOR ASSESSMENT:   Consult, Ventilator Enteral/tube feeding initiation and management  ASSESSMENT:   29 yo male found unresponsive with possible drug OD, admitted post cardiac arrest to ICU on vent with severe shock, AKI with hyperkalemia and acidosis. PMH includes polysubstance abuse.  6/09 CRRT initiated 6/10 CRRT discontinued 6/14 CT abdomen negative for acute process 6/17 Trach placement  6/18 Cortrak placed 7/02 Cortrak replaced 7/15 Decannulated, Diet advanced DYS 1, nectar liquids 7/16 Diet advanced DYS 2, nectar liquids 7/17 TF stopped, Cortrak removed    Diet advanced 7/21. Per tech pt eating very well. Last two meal completions charted as 100% for her last two meals. Transition to Ensure vs hormel shakes now that pt is on thin liquids.   Plan d/c home Thursday.   Admission weight: 86.9 kg  Current weight: 74 kg (taken 7/19)  Medications: Mag-ox, miralax, senokot Labs: K 3.4 (L) Mg 1.4 (L)   Diet Order:   Diet Order            Diet regular Room service appropriate? Yes with Assist; Fluid consistency: Thin  Diet effective now                 EDUCATION NEEDS:   Not appropriate for education at this time  Skin:  Skin Assessment: Skin Integrity Issues: Skin Integrity Issues:: Other (Comment) Other: MASD- groin, buttocks Incisions: mid neck   Last BM:  7/26  Height:   Ht Readings from Last 1 Encounters:  08/03/19 6\' 1"  (1.854 m)    Weight:   Wt Readings from Last 1 Encounters:  09/03/19 74 kg     BMI:  Body mass index is 21.52 kg/m.  Estimated Nutritional Needs:   Kcal:  2250-2625 kcals  Protein:  120-160 g  Fluid:  >/= 2 L  09-19-1986 RD, LDN Clinical Nutrition Pager listed in AMION

## 2019-09-11 NOTE — Progress Notes (Signed)
[facility] PROGRESS NOTE  George Kelly GGE:[id number] DOB: 1990-11-26 DOA: 07/25/2019 PCP: Patient, No Pcp Per  Status: Inpatient---Remains inpatient appropriate because:Inpatient level of care appropriate due to severity of illness and need to taper and dc psych meds, needs IP psych clearance before dc due to report of intentional OD PTA   Dispo: The patient is from: Home              Anticipated d/c is to: SNF              Anticipated d/c date is: > 3 days              Patient currently is not medically stable to d/c.  Patient continues to require inpatient titration of psych medications.  Since his mentation has recovered to baseline he now admits to taking an intentional narcotic overdose due to severe depression and grief after girlfriend aborted his baby at 5 months gestation.  Currently is denying suicidal ideation but for completeness of evaluation have asked for formal psychiatric consultation prior to discharge home.  Family concerned over patient possibly having undiagnosed bipolar disorder given significant family history of same.  Also does not have safe discharge plan noting none of family in the area can take him in after discharge, patient also lost his apartment while he was hospitalized and his only option at this point is to move to [place] and live with his girlfriend (not same girlfriend that had abortion)  As of 7/27 psych has cleared patient to discharge home with outpatient resources.  Currently we are tapering down patient's Seroquel, beta-blocker and Depakene and have added Lexapro to aid in treating his polysubstance disorder and ongoing depression.  Patient aware tentative discharge will be Thursday of this week and he will discharged to girlfriend's house.  He will have transportation to the [place] area for appointments after discharge.  Code Status: Full Family Communication: 7/26 sister and mother updated.  7/27 mother updated regarding psych recommendations  and patient stable to discharge home.  She confirms patient will be unable to live with her.  States girlfriend has "nice" home in [place] and it is okay that he discharges there.  She will also be assisting patient to try to obtain housing in the [place] area. DVT prophylaxis: Lovenox Vaccination status: Unknown  HPI: 29 yo male found unresponsive with probable drug overdose. PEA arrest with ROSC after about 15 minutes. Found to have lactic acidosis, AKI from rhabdomyolysis, aspiration pneumonia, shock liver, hypothermia. UDS positive for cocaine, benzo's, THC.  He initially required CPPT for acute renal failure.  Weaned off of precedex and weaning some of his sedative and psychotropic medications prior to transfer from unit.  Patient haD been slowly progressing regards to improving mentation and improved respiratory status.  He was decannulated on 7/15 and is stable on room air.  Remains impulsive and must have one-to-one sitter when he is unrestrained for activity such as eating.  As of 7/21 SLP has advanced patient to regular diet with thin liquids. Restraints have been removed in favor of [place] bed.  Over the weekend beginning on 7/25 patient had significant improvement in mentation.  He is now alert and oriented.  He is able to ambulate independently without any gait disturbances.  He was able to shower.  He admitted to the attending physician that he had intentionally taken a narcotic overdose due to severe grief and depression over the loss of a child-girlfriend aborted baby at 5 months gestation.  Currently  denying suicidal ideation and states he has good support at home with his family and girlfriend.   Subjective: Awake and oriented x3. Patient self-conscious over lack of appropriate grooming stating he normally does not have a beard and that his hair is "out of control". Denies suicidal ideation, states "I have good support at home with my mom and assist and my  girl"  Objective: Vitals:   09/10/19 2258 09/11/19 0738  BP: (!) 143/76 (!) 132/88  Pulse: 78 93  Resp:  18  Temp: 98.3 F (36.8 C) 98.7 F (37.1 C)  SpO2: 98% 99%   No intake or output data in the 24 hours ending 09/11/19 1016 Filed Weights   08/31/19 0500 09/01/19 0500 09/03/19 0500  Weight: 68 kg 68 kg 74 kg    Exam: Constitutional: NAD, alert, not restless at this time Neck: normal, supple, no masses, tracheal stoma site  Respiratory: clear to auscultation bilaterally, Normal respiratory effort. No accessory muscle use.  Room air.   Cardiovascular: Regular rate and rhythm, no murmurs / rubs / gallops. No extremity edema. 2+ pedal pulses.  Abdomen: no tenderness, no masses palpated. No hepatosplenomegaly. Bowel sounds positive.  Musculoskeletal: no clubbing / cyanosis. No joint deformity upper and lower extremities. Good ROM, no contractures. Normal muscle tone.  Skin: no rashes, lesions, ulcers. No induration Neurologic: CN 2-12 grossly intact. Sensation intact, DTR not assessed.  Strength 5/5 x all 4 extremities.  Ambulates without any ambulatory dysfunction or assistive device. Psychiatric: Alert and oriented x4.   Assessment/Plan: Acute on chronic hypoxic Respiratory Failure requiring tracheostomy tube Out of hospital cardiac Arrest Aspiration Pneumonia/Enterobacter PNA -Decannulated on 7/15-Stoma closed as of 7/21 -Stoma culture drainage obtained on 7/17 consistent with a few MRSA.  Patient without any significant symptoms: he is afebrile,WBC downward  trend to 11.8 as of 7/14.  Will obtain CBC.  Suspect colonization as opposed to active infection so will not start antibiotics at this juncture  Unintentional OD/family history of bipolar disorder -Patient admits to unintentional overdose in the context of of excessive EtOH intake at house party- urine drug screen positive for cocaine, [facility]C [facility] -Family reports a longstanding history of alcohol abuse with significant mood  changes during ingestion of alcohol -Patient apparently had stopped consuming alcohol while awaiting the birth of his girlfriends baby.  When this girlfriend aborted the fetus at 5 months gestation this apparently was a triggering event for patient at which point he began drinking heavily and using drugs and eventually performed intentional overdose prior to arrival. -Appreciate psychiatry evaluation.  His primary diagnosis is polysubstance abuse with recommendation for outpatient follow-up. -Given recent issues with depression have started Lexapro and and tapering down Depakene since no evidence of seizures and this was primarily used for behavior.  Also tapering Seroquel and beta-blockers with no plans to continue Seroquel, beta-blockers or Depakene after discharge -Patient plans to discharge with girlfriend to [place] and states has transportation available to attend appointments in [place] -See below under metabolic encephalopathy regarding medication adjustments.  Dysphagia -Resolved -7/21 SLP evaluated and advance patient to regular diet with thin liquids  Nutrition Status: Nutrition Problem: Inadequate oral intake Etiology: acute illness Signs/Symptoms: recent NPO Interventions:  7/21: regular diet with thin liquids Estimated body mass index is 21.52 kg/m as calculated from the following:   Height as of this encounter: 6\' 1"  (1.854 m).   Weight as of this encounter: 74 kg.  -Patient eating 100% of diet and asking for snacks in between-also ingesting 100%  of protein supplements  Fever -Resolved -Suspect related to atelectasis   Acute metabolic encephalopathy 2nd to anoxic brain injury, sepsis  Hx of substance abuse -Patient currently without withdrawal symptoms and has been continued on appropriate medications during the hospitalization -7/15 it was noted that patient performed better with PT/OT/SLP prior to receiving sedative medications therefore began titrating offending  medications: -Prn Ativan stopped in favor of prn Klonopin -7/25 decreased scheduled Klonopin to 0.25 mg daily and as of 7/26 have discontinued -7/24 changed scheduled oxycodone to 5 mg q 8 hrs prn -7/26: Decreased Seroquel to 25 mg BID and plan is to discontinue upon discharge -7/27: Depakene from 500 mg TID to 125 mg BID with plans to discontinue upon discharge -Cont prn Haldol and Benadryl for BT behavioral issues while in the hospital -7/27: Decreased propranolol to 10 mg BID   Hypertension -BP soft so Norvasc dc'd 7/22 -Continue labetalol and consider tapering as tolerated-current SBP well controlled although heart rate averages 70s to 90s  Anemia of critical illness -Hgb stable at 10.9-would transfuse for <7.   Physical deconditioning -Now ambulating without difficulty -Above regarding improved mentation  Hypomagnesemia -Magnesium 1.8 after replacement but as of 7/27 despite oral replacement had decreased once again to 1.4 therefore was given 2 g IV x1  Other:   PICC line placed 7/23-need to be removed upon discharge     Data Reviewed: Basic Metabolic Panel: Recent Labs  Lab 09/06/19 0242 09/10/19 0833  NA 136 135  K 4.0 3.4*  CL 101 101  CO2 25 26  GLUCOSE 92 108*  BUN 10 <5*  CREATININE 0.79 0.77  CALCIUM 8.8* 8.5*  MG 1.8 1.4*   Liver Function Tests: Recent Labs  Lab 09/06/19 0242 09/10/19 0833  AST 16 22  ALT 18 21  ALKPHOS 97 88  BILITOT 0.7 0.4  PROT 7.8 7.5  ALBUMIN 2.6* 2.6*   No results for input(s): LIPASE, AMYLASE in the last 168 hours. Recent Labs  Lab 09/06/19 0242 09/10/19 0833  AMMONIA 39* 29   CBC: Recent Labs  Lab 09/06/19 0242 09/10/19 0833  WBC 8.0 7.5  NEUTROABS 3.2 3.7  HGB 9.8* 10.7*  HCT 29.9* 33.5*  MCV 84.7 85.2  PLT 246 224   Cardiac Enzymes: No results for input(s): CKTOTAL, CKMB, CKMBINDEX, TROPONINI in the last 168 hours. BNP (last 3 results) No results for input(s): BNP in the last 8760  hours.  ProBNP (last 3 results) No results for input(s): PROBNP in the last 8760 hours.  CBG: No results for input(s): GLUCAP in the last 168 hours.  Recent Results (from the past 240 hour(s))  Aerobic Culture (superficial specimen)     Status: None   Collection Time: 09/01/19  6:10 PM   Specimen: Wound; Tracheal  Result Value Ref Range Status   Specimen Description WOUND  Final   Special Requests TRACHEAL SITE  Final   Gram Stain   Final    RARE WBC PRESENT, PREDOMINANTLY PMN FEW GRAM POSITIVE COCCI Performed at [facility], [address] [place], [place] [address]    Culture FEW METHICILLIN RESISTANT STAPHYLOCOCCUS AUREUS  Final   Report Status 09/03/2019 FINAL  Final   Organism ID, Bacteria METHICILLIN RESISTANT STAPHYLOCOCCUS AUREUS  Final      Susceptibility   Methicillin resistant staphylococcus aureus - MIC*    CIPROFLOXACIN >=8 RESISTANT Resistant     ERYTHROMYCIN >=8 RESISTANT Resistant     GENTAMICIN <=0.5 SENSITIVE Sensitive     OXACILLIN >=  4 RESISTANT Resistant     TETRACYCLINE <=1 SENSITIVE Sensitive     VANCOMYCIN <=0.5 SENSITIVE Sensitive     TRIMETH/SULFA 160 RESISTANT Resistant     CLINDAMYCIN <=0.25 SENSITIVE Sensitive     RIFAMPIN <=0.5 SENSITIVE Sensitive     Inducible Clindamycin NEGATIVE Sensitive     * FEW METHICILLIN RESISTANT STAPHYLOCOCCUS AUREUS     Studies: No results found.  Scheduled Meds: . chlorhexidine  15 mL Mouth Rinse BID  . Chlorhexidine Gluconate Cloth  6 each Topical Daily  . enoxaparin (LOVENOX) injection  40 mg Subcutaneous Q24H  . escitalopram  10 mg Oral Daily  . magnesium oxide  400 mg Oral BID  . mouth rinse  15 mL Mouth Rinse q12n4p  . melatonin  5 mg Oral Daily  . nicotine  14 mg Transdermal Daily  . pantoprazole  40 mg Oral Daily  . polyethylene glycol  17 g Oral BID  . propranolol  10 mg Oral BID  . QUEtiapine  25 mg Oral BID  . senna-docusate  1 tablet Oral BID  . sodium chloride flush  10-40 mL  Intracatheter Q12H  . sodium chloride flush  10-40 mL Intracatheter Q12H  . valproic acid  125 mg Oral TID   Continuous Infusions: . sodium chloride Stopped (08/21/19 0548)    Principal Problem:   Overdose, accidental or unintentional, initial encounter Active Problems:   Cardiac arrest (HCC)   Hyperkalemia   Acute respiratory failure with hypoxia (HCC)   Acute renal failure with tubular necrosis (HCC)   PEA (Pulseless electrical activity) (HCC)   Acute hypoxemic respiratory failure (HCC)   Acute renal failure (ARF) (HCC)   Status post tracheostomy (HCC)   Tracheostomy tube present (HCC)   Polysubstance abuse (HCC)   Leukocytosis   Acute blood loss anemia   Anoxic brain injury (HCC)   Dysphagia causing pulmonary aspiration with swallowing   Subacute delirium   Overdose of opiate or related narcotic, intentional self-harm, initial encounter (HCC)   Alcohol use disorder, moderate, dependence (HCC)   Consultants:  PCCM  Nephrology  Procedures:  ETT placement  R Chest Tube  Trach  R Femoral HD Cath for CPPT  R [place] CVL  L Radial A-line   Antibiotics: Anti-infectives (From admission, onward)   Start     Dose/Rate Route Frequency Ordered Stop   08/02/19 1400  ceFEPIme (MAXIPIME) 2 g in sodium chloride 0.9 % 100 mL IVPB        2 g 200 mL/hr over 30 Minutes Intravenous Every 8 hours 08/02/19 1232 08/09/19 0615   07/31/19 1000  linezolid (ZYVOX) IVPB 600 mg  Status:  Discontinued        600 mg 300 mL/hr over 60 Minutes Intravenous Every 12 hours 07/31/19 0827 08/02/19 0736   07/31/19 0830  meropenem (MERREM) 1 g in sodium chloride 0.9 % 100 mL IVPB  Status:  Discontinued        1 g 200 mL/hr over 30 Minutes Intravenous Every 8 hours 07/31/19 0827 08/02/19 1232   07/28/19 0800  piperacillin-tazobactam (ZOSYN) IVPB 3.375 g  Status:  Discontinued        3.375 g 12.5 mL/hr over 240 Minutes Intravenous Every 8 hours 07/28/19 0759 07/31/19 0827   07/27/19 1430   linezolid (ZYVOX) IVPB 600 mg  Status:  Discontinued        600 mg 300 mL/hr over 60 Minutes Intravenous Every 12 hours 07/27/19 1418 07/28/19 0749   07/26/19 1630  piperacillin-tazobactam (ZOSYN)  IVPB 2.25 g  Status:  Discontinued        2.25 g 100 mL/hr over 30 Minutes Intravenous Every 8 hours 07/26/19 1029 07/28/19 0759   07/26/19 1045  vancomycin (VANCOCIN) IVPB 1000 mg/200 mL premix        1,000 mg 200 mL/hr over 60 Minutes Intravenous  Once 07/26/19 1033 07/26/19 1214   07/26/19 0801  vancomycin variable dose per unstable renal function (pharmacist dosing)  Status:  Discontinued         Does not apply See admin instructions 07/26/19 0801 07/27/19 1418   07/26/19 0700  piperacillin-tazobactam (ZOSYN) IVPB 3.375 g  Status:  Discontinued        3.375 g 100 mL/hr over 30 Minutes Intravenous Every 6 hours 07/26/19 0656 07/26/19 1028   07/25/19 1700  ceFEPIme (MAXIPIME) 2 g in sodium chloride 0.9 % 100 mL IVPB  Status:  Discontinued        2 g 200 mL/hr over 30 Minutes Intravenous Every 24 hours 07/25/19 1642 07/26/19 0650   07/25/19 1645  vancomycin (VANCOREADY) IVPB 1500 mg/300 mL        1,500 mg 150 mL/hr over 120 Minutes Intravenous  Once 07/25/19 1642 07/25/19 1901       Time spent: 25 minutes    Junious Silk ANP  [facility] Pager [phone]. If 7PM-7AM, please contact night-coverage at [email or web address] 09/11/2019, 10:16 AM  LOS: 48 days

## 2019-09-11 NOTE — TOC Initial Note (Signed)
Transition of Care [facility]) - Initial/Assessment Note    Patient Details  Name: George Kelly MRN: [id number] Date of Birth: 01/22/1991  [facility] CM/SW Contact:    Carles Collet, RN Phone Number: 09/11/2019, 10:05 AM  Clinical Narrative:       Met with patient at bedside. Patient has been cleared by psych, he was alert, oriented and conversive and in good spirits during our conversation.   He states that he will go home to stay with his mother at DC. CM requested address, he states that she recently changed address and he was not sure of her exact address, but she is in [place].  He was agreeable to [facility] referral for housing and counseling. Referral placed.  TOC pharmacy added to profile, please send DC meds there by Friday to be filled so patient can take them with him at DC. MATCH entered in the system, he will need override for cost.  Requested CMA to schedule at [facility] so patient can establish w PCP, and have resource to low cost pharmacy and follow up for psych referral. Explained office resources to patient and importance of compliance with F/U apt. Added to AVS. Verified cell phone number.  Patient states that he will have ride home.   Anticipate DC Thursday after psych meds titrated.                   Expected Discharge Plan: Home/Self Care Barriers to Discharge: Continued Medical Work up   Patient Goals and CMS Choice Patient states their goals for this hospitalization and ongoing recovery are:: will go home to stay with his mother CMS Medicare.gov Compare Post Acute Care list provided to:: Patient Represenative (must comment) (mother, Rodena Piety, and sister, Delana Meyer)    Expected Discharge Plan and Services Expected Discharge Plan: Home/Self Care In-house Referral: Development worker, community, Clinical Social Work Discharge Planning Services: CM Consult   Living arrangements for the past 2 months: Apartment                                       Prior Living Arrangements/Services Living arrangements for the past 2 months: Apartment Lives with:: Self                   Activities of Daily Living      Permission Sought/Granted                  Emotional Assessment Appearance:: Appears stated age Attitude/Demeanor/Rapport: Sedated, Intubated (Following Commands or Not Following Commands) Affect (typically observed): Calm   Alcohol / Substance Use: Illicit Drugs Psych Involvement: No (comment)  Admission diagnosis:  Cardiac arrest ([place]) [I46.9] Acute renal failure (ARF) ([place]) [N17.9] Patient Active Problem List   Diagnosis Date Noted   Alcohol use disorder, moderate, dependence ([place]) 09/10/2019   Overdose, accidental or unintentional, initial encounter 09/10/2019   Overdose of opiate or related narcotic, intentional self-harm, initial encounter ([place])    Subacute delirium    Dysphagia causing pulmonary aspiration with swallowing    Anoxic brain injury ([place])    PEA (Pulseless electrical activity) ([place])    Acute hypoxemic respiratory failure ([place])    Acute renal failure (ARF) ([place])    Status post tracheostomy ([place])    Tracheostomy tube present ([place])    Polysubstance abuse ([place])    Leukocytosis    Acute blood loss anemia    Acute renal failure with  tubular necrosis ([place])    Acute respiratory failure with hypoxia ([place])    Cardiac arrest ([place]) 07/25/2019   Hyperkalemia    PCP:  Patient, No Pcp Per Pharmacy:   [facility], [address] [place] [address] Phone: [phone] Fax: [phone]  [facility] [place] [place] - [address] [place] [address] Phone: [phone] Fax: [phone]     Social Determinants of Health (SDOH) Interventions    Readmission Risk Interventions No flowsheet data found.

## 2019-09-11 NOTE — Progress Notes (Signed)
Occupational Therapy Treatment/Discharge Patient Details Name: George Kelly MRN: [id number] DOB: February 26, 1990 Today's Date: 09/11/2019    History of present illness 29 yo male found unresponsive with probable drug overdose.  PEA arrest with ROSC after about 15 minutes.  Found to have lactic acidosis, AKI from rhabdomyolysis, aspiration pneumonia, shock liver, hypothermia.  UDS positive for cocaine, benzo's, THC.   6/17 tracheostomy.   OT comments  Pt presents with much improved cognition and behavior, A&Ox4. Pt demonstrated ability to complete simple transfers, LB dressing and grooming in standing Independently. Distant supervision provided for hallway distance mobility with mild unsteadiness at times, but pt able to self correct without difficulty. Per staff, pt completed showering task today without assistance. Reinforced healthy behaviors and coping strategies with pt motivated to do better for himself. Pt has met OT goals and ready for DC from skilled OT services. No further OT needs at DC.   Follow Up Recommendations  No OT follow up;Supervision - Intermittent    Equipment Recommendations  None recommended by OT    Recommendations for Other Services      Precautions / Restrictions Precautions Precautions: Fall Restrictions Weight Bearing Restrictions: No       Mobility Bed Mobility Overal bed mobility: Independent Bed Mobility: Supine to Sit           General bed mobility comments: Independent,no assist needed  Transfers Overall transfer level: Independent Equipment used: None Transfers: Sit to/from [employer] to Stand: Independent Stand pivot transfers: Independent       General transfer comment: Independent for transfers, distant supervision for hallway distance mobility with no issues noted    Balance   Sitting-balance support: No upper extremity supported;Feet supported Sitting balance-Leahy Scale: Normal     Standing balance  support: No upper extremity supported;During functional activity Standing balance-Leahy Scale: Good Standing balance comment: Mild unsteadiness but able to self correct without issues                           ADL either performed or assessed with clinical judgement   ADL Overall ADL's : Modified independent;Independent     Grooming: Independent;Applying deodorant;Standing Grooming Details (indicate cue type and reason): Independent to don deodorant in standing             Lower Body Dressing: Independent Lower Body Dressing Details (indicate cue type and reason): Independent donning socks Toilet Transfer: Independent;Ambulation;BSC Toilet Transfer Details (indicate cue type and reason): Independent with ambulation, sitting on BSC with lid down for rest break         Functional mobility during ADLs: Supervision/safety General ADL Comments: Pt with much improved balance, good strength, improved safety awareness     Vision   Vision Assessment?: No apparent visual deficits   Perception     Praxis      Cognition Arousal/Alertness: Awake/alert Behavior During Therapy: Flat affect Overall Cognitive Status: Within Functional Limits for tasks assessed                                 General Comments: Cognition, behavior much improved. A&Ox4. Pt calm, appropriate responses and conversing with therapist. Pt motivated to go home and do better for himself        Exercises     Shoulder Instructions       General Comments Pt reports fatigue but agreeable to participate, motivated to go outside. Collaborated  with nursing staff to assist in facilitating trip outside. Pt has progressed majorly with no further OT needs    Pertinent Vitals/ Pain       Pain Assessment: No/denies pain  Home Living                                          Prior Functioning/Environment              Frequency           Progress Toward  Goals  OT Goals(current goals can now be found in the care plan section)  Progress towards OT goals: Goals met/education completed, patient discharged from OT  Acute Rehab OT Goals Patient Stated Goal: go outside Potential to Achieve Goals: Good  Plan Discharge plan needs to be updated;All goals met and education completed, patient discharged from OT services    Co-evaluation                 AM-PAC OT "6 Clicks" Daily Activity     Outcome Measure   Help from another person eating meals?: None Help from another person taking care of personal grooming?: None Help from another person toileting, which includes using toliet, bedpan, or urinal?: None Help from another person bathing (including washing, rinsing, drying)?: None Help from another person to put on and taking off regular upper body clothing?: None Help from another person to put on and taking off regular lower body clothing?: None 6 Click Score: 24    End of Session        Activity Tolerance Patient tolerated treatment well   Patient Left in chair;with call bell/phone within reach   Nurse Communication Mobility status        Time: 0352-4818 OT Time Calculation (min): 14 min  Charges: OT General Charges $OT Visit: 1 Visit OT Treatments $Self Care/Home Management : 8-22 mins  Layla Maw, OTR/L   Layla Maw 09/11/2019, 1:56 PM

## 2019-09-12 LAB — MAGNESIUM: Magnesium: 1.7 mg/dL (ref 1.7–2.4)

## 2019-09-12 LAB — CBC WITH DIFFERENTIAL/PLATELET
Abs Immature Granulocytes: 0.04 10*3/uL (ref 0.00–0.07)
Basophils Absolute: 0.1 10*3/uL (ref 0.0–0.1)
Basophils Relative: 1 %
Eosinophils Absolute: 0.6 10*3/uL — ABNORMAL HIGH (ref 0.0–0.5)
Eosinophils Relative: 6 %
HCT: 33.2 % — ABNORMAL LOW (ref 39.0–52.0)
Hemoglobin: 10.8 g/dL — ABNORMAL LOW (ref 13.0–17.0)
Immature Granulocytes: 0 %
Lymphocytes Relative: 22 %
Lymphs Abs: 2.1 10*3/uL (ref 0.7–4.0)
MCH: 27.3 pg (ref 26.0–34.0)
MCHC: 32.5 g/dL (ref 30.0–36.0)
MCV: 84.1 fL (ref 80.0–100.0)
Monocytes Absolute: 0.9 10*3/uL (ref 0.1–1.0)
Monocytes Relative: 9 %
Neutro Abs: 5.8 10*3/uL (ref 1.7–7.7)
Neutrophils Relative %: 62 %
Platelets: 250 10*3/uL (ref 150–400)
RBC: 3.95 MIL/uL — ABNORMAL LOW (ref 4.22–5.81)
RDW: 14.9 % (ref 11.5–15.5)
WBC: 9.5 10*3/uL (ref 4.0–10.5)
nRBC: 0 % (ref 0.0–0.2)

## 2019-09-12 LAB — RAPID URINE DRUG SCREEN, HOSP PERFORMED
Amphetamines: NOT DETECTED
Barbiturates: NOT DETECTED
Benzodiazepines: NOT DETECTED
Cocaine: NOT DETECTED
Opiates: NOT DETECTED
Tetrahydrocannabinol: NOT DETECTED

## 2019-09-12 LAB — BASIC METABOLIC PANEL
Anion gap: 9 (ref 5–15)
BUN: <5 mg/dL — ABNORMAL LOW (ref 6–20)
CO2: 26 mmol/L (ref 22–32)
Calcium: 9 mg/dL (ref 8.9–10.3)
Chloride: 100 mmol/L (ref 98–111)
Creatinine, Ser: 0.76 mg/dL (ref 0.61–1.24)
GFR calc Af Amer: >60 mL/min (ref >60)
GFR calc non Af Amer: >60 mL/min (ref >60)
Glucose, Bld: 98 mg/dL (ref 70–99)
Potassium: 4.3 mmol/L (ref 3.5–5.1)
Sodium: 135 mmol/L (ref 135–145)

## 2019-09-12 LAB — PHOSPHORUS: Phosphorus: 4.3 mg/dL (ref 2.5–4.6)

## 2019-09-12 MED ORDER — MELATONIN 5 MG PO TABS: 5.0000 mg | ORAL_TABLET | Freq: Every day | ORAL | 0 refills | Status: AC

## 2019-09-12 MED ORDER — ESCITALOPRAM OXALATE 10 MG PO TABS: 10.0000 mg | ORAL_TABLET | Freq: Every day | ORAL | 1 refills | Status: AC

## 2019-09-12 MED ORDER — MAGNESIUM OXIDE 400 (241.3 MG) MG PO TABS: 400.0000 mg | ORAL_TABLET | Freq: Two times a day (BID) | ORAL | 1 refills | Status: AC

## 2019-09-12 MED ORDER — ACETAMINOPHEN 325 MG PO TABS: 650.0000 mg | ORAL_TABLET | ORAL | Status: AC | PRN

## 2019-09-12 MED FILL — ESCITALOPRAM 10 MG TABLET: 10 | 30 days supply | Qty: 30 | Fill #0

## 2019-09-12 MED FILL — MAGNESIUM OXIDE 400 MG TABS: 400 | 30 days supply | Qty: 60 | Fill #0

## 2019-09-12 NOTE — Progress Notes (Signed)
[facility] PROGRESS NOTE  George Kelly ZOX:[id number] DOB: 01-02-1991 DOA: 07/25/2019 PCP: Patient, No Pcp Per  Status: Inpatient---Remains inpatient appropriate because:Inpatient level of care appropriate due to severity of illness and need to taper and dc psych meds, needs IP psych clearance before dc due to report of intentional OD PTA   Dispo: The patient is from: Home              Anticipated d/c is to: SNF              Anticipated d/c date is: 1 day              Patient currently is medically stable to d/c.  As of 7/27 psych has cleared patient to discharge home with outpatient resources.  Currently we are tapering down patient's Seroquel, beta-blocker and Depakene and have added Lexapro to aid in treating his polysubstance disorder and ongoing depression.  Patient aware tentative discharge will be Thursday of this week and he will discharged to girlfriend's house.  He will have transportation to the [place] area for appointments after discharge.  Code Status: Full Family Communication: 7/26 sister and mother updated.  7/27 mother updated regarding psych recommendations and patient stable to discharge home.  She confirms patient will be unable to live with her.  States girlfriend has "nice" home in [place] and it is okay that he discharges there.  She will also be assisting patient to try to obtain housing in the [place] area. DVT prophylaxis: Lovenox Vaccination status: Unknown  HPI: 29 yo male found unresponsive with probable drug overdose. PEA arrest with ROSC after about 15 minutes. Found to have lactic acidosis, AKI from rhabdomyolysis, aspiration pneumonia, shock liver, hypothermia. UDS positive for cocaine, benzo's, THC.  He initially required CPPT for acute renal failure.  Weaned off of precedex and weaning some of his sedative and psychotropic medications prior to transfer from unit.  Patient haD been slowly progressing regards to improving mentation and improved  respiratory status.  He was decannulated on 7/15 and is stable on room air.  Remains impulsive and must have one-to-one sitter when he is unrestrained for activity such as eating.  As of 7/21 SLP has advanced patient to regular diet with thin liquids. Restraints have been removed in favor of [place] bed.  Beginning on 7/25 patient had significant improvement in mentation.  He is now alert and oriented.  He is able to ambulate independently without any gait disturbances.  He was able to shower.  He admitted to the attending physician that he had intentionally taken a narcotic overdose due to severe grief and depression over the loss of a child-girlfriend aborted baby at 5 months gestation.  Currently denying suicidal ideation and states he has good support at home with his family and girlfriend.   Subjective: Awake and oriented x3.  Seems to have some mild short-term memory deficits.  Discussed with patient that due to risk management issues it is necessary for him to be accompanied by staff if he leaves the unit.  He verbalized understanding of this.  Objective: Vitals:   09/11/19 2114 09/11/19 2114  BP: (!) 149/85 (!) 149/85  Pulse: 87 94  Resp:    Temp:  98.3 F (36.8 C)  SpO2:  99%    Intake/Output Summary (Last 24 hours) at 09/12/2019 1228 Last data filed at 09/12/2019 0600 Gross per 24 hour  Intake 600 ml  Output --  Net 600 ml   Filed Weights   08/31/19 0500  09/01/19 0500 09/03/19 0500  Weight: 68 kg 68 kg 74 kg    Exam: Constitutional: NAD, alert, not restless at this time Neck: normal, supple, no masses, tracheal stoma site  Respiratory: clear to auscultation bilaterally, Normal respiratory effort. No accessory muscle use.  Room air.   Cardiovascular: Regular rate and rhythm, no murmurs / rubs / gallops. No extremity edema. 2+ pedal pulses.  Abdomen: no tenderness, no masses palpated. No hepatosplenomegaly. Bowel sounds positive.  Musculoskeletal: no clubbing / cyanosis. No  joint deformity upper and lower extremities. Good ROM, no contractures. Normal muscle tone.  Skin: no rashes, lesions, ulcers. No induration Neurologic: CN 2-12 grossly intact. Sensation intact, DTR not assessed.  Strength 5/5 x all 4 extremities.  Ambulates without any ambulatory dysfunction or assistive device. Psychiatric: Alert and oriented x4.   Assessment/Plan: Acute on chronic hypoxic Respiratory Failure requiring tracheostomy tube Out of hospital cardiac Arrest Aspiration Pneumonia/Enterobacter PNA -Decannulated on 7/15-Stoma closed as of 7/21 -Stoma culture drainage obtained on 7/17 consistent with a few MRSA.  Patient without any significant symptoms: he is afebrile,WBC downward  trend to 11.8 as of 7/14.  Will obtain CBC.  Suspect colonization.  Unintentional OD/family history of bipolar disorder -Patient admits to unintentional overdose in the context of of excessive EtOH intake at house party- urine drug screen positive for cocaine, THC [facility] -Family reports a longstanding history of alcohol abuse with significant mood changes during ingestion of alcohol -Patient apparently had stopped consuming alcohol while awaiting the birth of his girlfriends baby.  When this girlfriend aborted the fetus at 5 months gestation this apparently was a triggering event for patient at which point he began drinking heavily and using drugs and eventually performed intentional overdose prior to arrival. -Appreciate psychiatry evaluation.  His primary diagnosis is polysubstance abuse with recommendation for outpatient follow-up. -Given recent issues with depression have Lexapro started this admission and are currently tapering down Depakene since no evidence of seizures and this was primarily used for behavior.  Also tapering Seroquel and beta-blockers with no plans to continue Seroquel, beta-blockers or Depakene after discharge -Patient plans to discharge with girlfriend to [place] and states has  transportation available to attend appointments in [place] -See below under metabolic encephalopathy regarding medication adjustments. -Patient left unit without assistance of staff yesterday evening with his girlfriend as she was leaving the hospital for the night.  I discussed this with the patient and he agreed not to do this again.  Urine drug screen was obtained due to high risk of patient using drugs while off the unit given his history of polysubstance abuse but fortunately this was negative.  Dysphagia -Resolved -7/21 SLP evaluated and advanced patient to regular diet with thin liquids  Nutrition Status: Nutrition Problem: Inadequate oral intake Etiology: acute illness Signs/Symptoms: recent NPO Interventions:  7/21: regular diet with thin liquids Estimated body mass index is 21.52 kg/m as calculated from the following:   Height as of this encounter: 6\' 1"  (1.854 m).   Weight as of this encounter: 74 kg.  -Patient eating 100% of diet and asking for snacks in between-also ingesting 100% of protein supplements  Fever -Resolved -Suspect related to atelectasis   Acute metabolic encephalopathy 2nd to anoxic brain injury, sepsis  Hx of substance abuse -Patient currently without withdrawal symptoms and has been continued on appropriate medications during the hospitalization -7/15 it was noted that patient performed better with PT/OT/SLP prior to receiving sedative medications therefore began titrating offending medications: -Prn Ativan stopped in favor  of prn Klonopin -7/25 decreased scheduled Klonopin to 0.25 mg daily and as of 7/26 have discontinued -7/24 changed scheduled oxycodone to 5 mg q 8 hrs prn -7/26: Decreased Seroquel to 25 mg BID and plan is to discontinue upon discharge -7/27: Depakene from 500 mg TID to 125 mg BID with plans to discontinue upon discharge -Cont prn Haldol and Benadryl for BT behavioral issues while in the hospital -7/27: Decreased propranolol to  10 mg BID   Hypertension -BP soft so Norvasc dc'd 7/22 -Continue labetalol and consider tapering as tolerated-current SBP well controlled although heart rate averages 70s to 90s  Anemia of critical illness -Hgb stable at 10.9-would transfuse for <7.   Physical deconditioning -Now ambulating without difficulty -Above regarding improved mentation  Hypomagnesemia -Magnesium 1.8 after replacement but as of 7/27 despite oral replacement had decreased once again to 1.4 therefore was given 2 g IV x1 -Continue magnesium after discharge  Other:   PICC line placed 7/23-need to be removed upon discharge     Data Reviewed: Basic Metabolic Panel: Recent Labs  Lab 09/06/19 0242 09/10/19 0833 09/12/19 1056  NA 136 135 135  K 4.0 3.4* 4.3  CL 101 101 100  CO2 25 26 26   GLUCOSE 92 108* 98  BUN 10 <5* <5*  CREATININE 0.79 0.77 0.76  CALCIUM 8.8* 8.5* 9.0  MG 1.8 1.4* 1.7  PHOS  --   --  4.3   Liver Function Tests: Recent Labs  Lab 09/06/19 0242 09/10/19 0833  AST 16 22  ALT 18 21  ALKPHOS 97 88  BILITOT 0.7 0.4  PROT 7.8 7.5  ALBUMIN 2.6* 2.6*   No results for input(s): LIPASE, AMYLASE in the last 168 hours. Recent Labs  Lab 09/06/19 0242 09/10/19 0833  AMMONIA 39* 29   CBC: Recent Labs  Lab 09/06/19 0242 09/10/19 0833 09/12/19 1056  WBC 8.0 7.5 9.5  NEUTROABS 3.2 3.7 5.8  HGB 9.8* 10.7* 10.8*  HCT 29.9* 33.5* 33.2*  MCV 84.7 85.2 84.1  PLT 246 224 250   Cardiac Enzymes: No results for input(s): CKTOTAL, CKMB, CKMBINDEX, TROPONINI in the last 168 hours. BNP (last 3 results) No results for input(s): BNP in the last 8760 hours.  ProBNP (last 3 results) No results for input(s): PROBNP in the last 8760 hours.  CBG: No results for input(s): GLUCAP in the last 168 hours.  No results found for this or any previous visit (from the past 240 hour(s)).   Studies: No results found.  Scheduled Meds: . chlorhexidine  15 mL Mouth Rinse BID  . Chlorhexidine  Gluconate Cloth  6 each Topical Daily  . enoxaparin (LOVENOX) injection  40 mg Subcutaneous Q24H  . escitalopram  10 mg Oral Daily  . feeding supplement (ENSURE ENLIVE)  237 mL Oral BID BM  . magnesium oxide  400 mg Oral BID  . mouth rinse  15 mL Mouth Rinse q12n4p  . melatonin  5 mg Oral Daily  . nicotine  14 mg Transdermal Daily  . pantoprazole  40 mg Oral Daily  . polyethylene glycol  17 g Oral BID  . propranolol  10 mg Oral BID  . QUEtiapine  25 mg Oral BID  . senna-docusate  1 tablet Oral BID  . sodium chloride flush  10-40 mL Intracatheter Q12H  . sodium chloride flush  10-40 mL Intracatheter Q12H  . valproic acid  125 mg Oral TID   Continuous Infusions: . sodium chloride Stopped (08/21/19 0548)    Principal  Problem:   Overdose, accidental or unintentional, initial encounter Active Problems:   Cardiac arrest (HCC)   Hyperkalemia   Acute respiratory failure with hypoxia (HCC)   Acute renal failure with tubular necrosis (HCC)   PEA (Pulseless electrical activity) (HCC)   Acute hypoxemic respiratory failure (HCC)   Acute renal failure (ARF) (HCC)   Status post tracheostomy (HCC)   Tracheostomy tube present (HCC)   Polysubstance abuse (HCC)   Leukocytosis   Acute blood loss anemia   Anoxic brain injury (HCC)   Dysphagia causing pulmonary aspiration with swallowing   Subacute delirium   Overdose of opiate or related narcotic, intentional self-harm, initial encounter (HCC)   Alcohol use disorder, moderate, dependence (HCC)   Consultants:  PCCM  Nephrology  Procedures:  ETT placement  R Chest Tube  Trach  R Femoral HD Cath for CPPT  R Gray CVL  L Radial A-line   Antibiotics: Anti-infectives (From admission, onward)   Start     Dose/Rate Route Frequency Ordered Stop   08/02/19 1400  ceFEPIme (MAXIPIME) 2 g in sodium chloride 0.9 % 100 mL IVPB        2 g 200 mL/hr over 30 Minutes Intravenous Every 8 hours 08/02/19 1232 08/09/19 0615   07/31/19 1000   linezolid (ZYVOX) IVPB 600 mg  Status:  Discontinued        600 mg 300 mL/hr over 60 Minutes Intravenous Every 12 hours 07/31/19 0827 08/02/19 0736   07/31/19 0830  meropenem (MERREM) 1 g in sodium chloride 0.9 % 100 mL IVPB  Status:  Discontinued        1 g 200 mL/hr over 30 Minutes Intravenous Every 8 hours 07/31/19 0827 08/02/19 1232   07/28/19 0800  piperacillin-tazobactam (ZOSYN) IVPB 3.375 g  Status:  Discontinued        3.375 g 12.5 mL/hr over 240 Minutes Intravenous Every 8 hours 07/28/19 0759 07/31/19 0827   07/27/19 1430  linezolid (ZYVOX) IVPB 600 mg  Status:  Discontinued        600 mg 300 mL/hr over 60 Minutes Intravenous Every 12 hours 07/27/19 1418 07/28/19 0749   07/26/19 1630  piperacillin-tazobactam (ZOSYN) IVPB 2.25 g  Status:  Discontinued        2.25 g 100 mL/hr over 30 Minutes Intravenous Every 8 hours 07/26/19 1029 07/28/19 0759   07/26/19 1045  vancomycin (VANCOCIN) IVPB 1000 mg/200 mL premix        1,000 mg 200 mL/hr over 60 Minutes Intravenous  Once 07/26/19 1033 07/26/19 1214   07/26/19 0801  vancomycin variable dose per unstable renal function (pharmacist dosing)  Status:  Discontinued         Does not apply See admin instructions 07/26/19 0801 07/27/19 1418   07/26/19 0700  piperacillin-tazobactam (ZOSYN) IVPB 3.375 g  Status:  Discontinued        3.375 g 100 mL/hr over 30 Minutes Intravenous Every 6 hours 07/26/19 0656 07/26/19 1028   07/25/19 1700  ceFEPIme (MAXIPIME) 2 g in sodium chloride 0.9 % 100 mL IVPB  Status:  Discontinued        2 g 200 mL/hr over 30 Minutes Intravenous Every 24 hours 07/25/19 1642 07/26/19 0650   07/25/19 1645  vancomycin (VANCOREADY) IVPB 1500 mg/300 mL        1,500 mg 150 mL/hr over 120 Minutes Intravenous  Once 07/25/19 1642 07/25/19 1901       Time spent: 20 minutes    Junious SilkAllison Tatsuo Musial ANP  [facility]  Pager [phone]. If 7PM-7AM, please contact night-coverage at [email or web address] 09/12/2019, 12:28 PM  LOS: 49 days

## 2019-09-12 NOTE — Progress Notes (Signed)
Physical Therapy Treatment Patient Details Name: George Kelly MRN: [id number] DOB: Nov 17, 1990 Today's Date: 09/12/2019    History of Present Illness 29 yo male found unresponsive with probable drug overdose.  PEA arrest with ROSC after about 15 minutes.  Found to have lactic acidosis, AKI from rhabdomyolysis, aspiration pneumonia, shock liver, hypothermia.  UDS positive for cocaine, benzo's, THC.   6/17 tracheostomy.    PT Comments    Patient was received standing at the doorway, ready for physical therapy, and excited to go outside. Communicated with nursing staff that we were taking patient outside for treatment session. Patient ambulated through the hallway, down three flights of stairs, down the sidewalk outside, and back up to his room independently. He completed the DGI with a score of 24/24. He was left standing in his room and verbally understood that he may not go outside without hospital staff present. No further concerns in regards to physical therapy, signing off on this patient.     Follow Up Recommendations  No PT follow up     Equipment Recommendations  None recommended by PT    Recommendations for Other Services       Precautions / Restrictions Precautions Precautions: None Restrictions Weight Bearing Restrictions: No    Mobility  Bed Mobility Overal bed mobility: Independent             General bed mobility comments: Pt received standing at door motivated to begin physical therapy  Transfers Overall transfer level: Independent               General transfer comment: Pt received standing at door motivated to begin physical therapy  Ambulation/Gait Ambulation/Gait assistance: Independent Gait Distance (Feet): 1000 Feet Assistive device: None       General Gait Details: Patient ambulated through the hallway, down three flights of steps, to hospital exit, completed DGI, walked upthe hill outside, and back up to his room independently with no  gait or mobility concerns.   Stairs             Wheelchair Mobility    Modified Rankin (Stroke Patients Only)       Balance Overall balance assessment: Independent   Sitting balance-Leahy Scale: Normal     Standing balance support: During functional activity;No upper extremity supported Standing balance-Leahy Scale: Normal Standing balance comment: no difficulties with balance during functional activity             High level balance activites: Braiding;Direction changes;Turns;Sudden stops;Head turns;Other (comment) (walking over and around obstacles) High Level Balance Comments: see below for DGI Standardized Balance Assessment Standardized Balance Assessment : Dynamic Gait Index   Dynamic Gait Index Level Surface: Normal Change in Gait Speed: Normal Gait with Horizontal Head Turns: Normal Gait with Vertical Head Turns: Normal Gait and Pivot Turn: Normal Step Over Obstacle: Normal Step Around Obstacles: Normal Steps: Normal Total Score: 24      Cognition Arousal/Alertness: Awake/alert Behavior During Therapy: WFL for tasks assessed/performed Overall Cognitive Status: Within Functional Limits for tasks assessed                     Current Attention Level: Divided   Following Commands: Follows multi-step commands consistently   Awareness: Anticipatory   General Comments: Cognition, behavior much improved. A&Ox4. Pt calm, appropriate responses and conversing with therapist. Pt motivated to go home and do better for himself      Exercises      General Comments General comments (skin integrity, edema, etc.): Pt  was motivated to walk with physical therapy and go outside. Collaborated with nursing staff to assist in facilitating in trip outside. Pt has progressed significantly with no further PT concerns.      Pertinent Vitals/Pain Pain Assessment: No/denies pain Pain Score: 0-No pain Faces Pain Scale: No hurt    Home Living                       Prior Function            PT Goals (current goals can now be found in the care plan section) Acute Rehab PT Goals Patient Stated Goal: go outside PT Goal Formulation: With patient Time For Goal Achievement: 09/20/19 Potential to Achieve Goals: Good Progress towards PT goals: Goals met/education completed, patient discharged from PT    Frequency    Other (Comment) (signing off today, will not receive further PT)      PT Plan Current plan remains appropriate    Co-evaluation              AM-PAC PT "6 Clicks" Mobility   Outcome Measure  Help needed turning from your back to your side while in a flat bed without using bedrails?: None Help needed moving from lying on your back to sitting on the side of a flat bed without using bedrails?: None Help needed moving to and from a bed to a chair (including a wheelchair)?: None Help needed standing up from a chair using your arms (e.g., wheelchair or bedside chair)?: None Help needed to walk in hospital room?: None Help needed climbing 3-5 steps with a railing? : None 6 Click Score: 24    End of Session   Activity Tolerance: Patient tolerated treatment well Patient left: Other (comment) (patient left in room, was told he cannot go outside without staff, was excited to be discharged soon.) Nurse Communication:  (communicated with nursing staff to take patient outside and after treatment session that he was back in his room.) PT Visit Diagnosis: Muscle weakness (generalized) (M62.81);Difficulty in walking, not elsewhere classified (R26.2);Other abnormalities of gait and mobility (R26.89)     Time: 9518-8416 PT Time Calculation (min) (ACUTE ONLY): 16 min  Charges:  $Gait Training: 8-22 mins                    Livingston Diones, SPT, ATC

## 2019-09-13 DIAGNOSIS — F4321 Adjustment disorder with depressed mood: Secondary | ICD-10-CM | POA: Diagnosis present

## 2019-09-13 DIAGNOSIS — I10 Essential (primary) hypertension: Secondary | ICD-10-CM | POA: Clinically undetermined

## 2019-09-13 MED ORDER — AMLODIPINE BESYLATE 5 MG PO TABS: 5.0000 mg | ORAL_TABLET | Freq: Every day | ORAL | 1 refills | Status: AC

## 2019-09-13 MED ORDER — AMLODIPINE BESYLATE 5 MG PO TABS
5.0000 mg | ORAL_TABLET | Freq: Every day | ORAL | Status: DC
  Administered 2019-09-13: 5 mg via ORAL

## 2019-09-13 MED FILL — AMLODIPINE BESYLATE 5 MG TA: 5 | 30 days supply | Qty: 30 | Fill #0

## 2019-09-13 NOTE — Discharge Summary (Addendum)
Physician Discharge Summary  George Kelly AXK:[id number] DOB: 20-May-1990 DOA: 07/25/2019  PCP: [facility]  Admit date: 07/25/2019 Discharge date: 09/13/2019  Time spent: >30 minutes  Recommendations for Outpatient Follow-up:  1. Patient has been given recommendations and resources to follow-up with outpatient psychiatric services 2. Patient has been newly diagnosed with hypertension and has been started on Norvasc 5 mg daily.  He has been instructed to follow-up with his family physician for blood pressure check in the next 1 to 2 weeks.  He has an appointment scheduled at the [facility] on 8/18 at 9:30 in the morning 3. Patient lost his primary housing while hospitalized.  At time of discharge he was to reside with his girlfriend in [place].  Patient was given paperwork to complete regarding section 8 housing.   Discharge Diagnoses:  Principal Problem:   Overdose, accidental or unintentional, initial encounter Active Problems:   Cardiac arrest (HCC)   Acute respiratory failure with hypoxia (HCC)   PEA (Pulseless electrical activity) (HCC)   Acute hypoxemic respiratory failure (HCC)   Acute renal failure (ARF) (HCC)   Polysubstance abuse (HCC)   Alcohol use disorder, moderate, dependence (HCC)   Essential hypertension   Hypomagnesemia   Situational depression   Discharge Condition: Stable  Diet recommendation: Regular  Filed Weights   08/31/19 0500 09/01/19 0500 09/03/19 0500  Weight: 68 kg 68 kg 74 kg    History of present illness:  29 yo male found unresponsive with probable drug overdose. PEA arrest with ROSC after about 15 minutes. Found to have lactic acidosis, AKI from rhabdomyolysis, aspiration pneumonia, shock liver, hypothermia. UDS positive for cocaine, benzo's, THC.  He initially required CPPT for acute renal failure.  Weaned off of precedex and weaning some of his sedative and psychotropic  medications prior to transfer from unit.  Patient haD been slowly progressing regards to improving mentation and improved respiratory status.  He was decannulated on 7/15 and is stable on room air.  Remains impulsive and must have one-to-one sitter when he is unrestrained for activity such as eating.  As of 7/21 SLP has advanced patient to regular diet with thin liquids. Restraints have been removed in favor of [place] bed.  Beginning on 7/25 patient had significant improvement in mentation.  He is now alert and oriented.  He is able to ambulate independently without any gait disturbances.  He was able to shower.  He admitted to the attending physician that he had intentionally taken a narcotic overdose due to severe grief and depression over the loss of a child-girlfriend aborted baby at 5 months gestation.  Currently denying suicidal ideation and states he has good support at home with his family and girlfriend.  Hospital Course:   Acute on chronic hypoxic Respiratory Failure requiring tracheostomy tube Out of hospital cardiac Arrest Aspiration Pneumonia/Enterobacter PNA -Decannulated on 7/15-Stoma closed as of 7/21 -Stoma culture drainage obtained on 7/17 consistent with a few MRSA.  Patient without any significant symptoms: he is afebrile,WBC downward  trend to 11.8 as of 7/14.  Secondary to colonization  Unintentional OD/family history of bipolar disorder -Patient admits to unintentional overdose in the context of of excessive EtOH intake at house party- urine drug screen positive for cocaine, THC [facility] -Family reports a longstanding history of alcohol abuse with significant mood changes during ingestion of alcohol -Patient apparently had stopped consuming alcohol while awaiting the birth of his girlfriends baby.  When this girlfriend aborted the  fetus at 5 months gestation this apparently was a triggering event for patient at which point he began drinking heavily and using drugs and eventually  performed intentional overdose prior to arrival. -Appreciate psychiatry evaluation.  His primary diagnosis is polysubstance abuse with recommendation for outpatient follow-up. -Given recent issues with depression Lexapro started this admission.  Tapered and discontinued Depakene since no evidence of seizures and this was primarily used for behavior. Have tapered and discontinued Seroquel and beta-blockers. -Patient plans to discharge with girlfriend to [place] and states has transportation available to attend appointments in [place]  Dysphagia -Resolved -7/21 SLP evaluated and advanced patient to regular diet with thin liquids  Nutrition Status: Nutrition Problem: Inadequate oral intake Etiology: acute illness Signs/Symptoms: recent NPO Interventions:  7/21: regular diet with thin liquids Estimated body mass index is 21.52 kg/m as calculated from the following:   Height as of this encounter: 6\' 1"  (1.854 m).   Weight as of this encounter: 74 kg.  -Patient eating 100% of diet and asking for snacks in between-also ingesting 100% of protein supplements  Fever -Resolved -Suspect related to atelectasis   Acute metabolic encephalopathy 2nd to anoxic brain injury, sepsis  Hx of substance abuse -Resolved -Patient currently without withdrawal symptoms and has been continued on appropriate medications during the hospitalization -Have tapered and discontinued Ativan, Klonopin, beta-blockers, Seroquel, and Depakene  Hypertension -BP soft so Norvasc dc'd 7/22 but as labetalol taper concluded BP on date of discharge was 152/96. -Norvasc resumed at 5 mg daily and prescription given -Are to avoid beta-blocker class of drugs and young patient due to potential side effects of sexual dysfunction  Anemia of critical illness -Hgb stable at 10.9  Physical deconditioning -Now ambulating without difficulty with no discharge needs regarding PT  Hypomagnesemia -Magnesium 1.8 after  replacement but as of 7/27 despite oral replacement had decreased once again to 1.4 therefore was given 2 g IV x1 with magnesium increasing to 1.7 on 7/28 -Continue magnesium after discharge  False positive RPR -Because of his persistent encephalopathy during the hospitalization multiple labs were checked.  TSH was normal.  RPR was reactive but the RPR titer was normal at 1: 2 and the T palladium antibodies were nonreactive  Procedures:  ETT placement  R Chest Tube  Trach  R Femoral HD Cath for CPPT  R [place] CVL  L Radial A-line  PICC line  Consultations:  PCCM  Nephrology  Psychiatry  Discharge Exam: Vitals:   09/12/19 2051 09/13/19 0811  BP: (!) 150/108 (!) 152/96  Pulse: 98 102  Resp: 17   Temp: 98 F (36.7 C) 98.2 F (36.8 C)  SpO2: 98% 99%   Constitutional: NAD, alert, not restless at this time Neck: normal, supple, no masses, tracheal stoma site  Respiratory: clear to auscultation bilaterally, Normal respiratory effort. No accessory muscle use.  Room air.   Cardiovascular: Regular rate and rhythm, no murmurs / rubs / gallops. No extremity edema. Abdomen: no tenderness, no masses palpated.  Bowel sounds positive.  Neurologic: CN 2-12 grossly intact. Sensation intact, DTR not assessed.  Strength 5/5 x all 4 extremities.  Ambulates without any ambulatory dysfunction or assistive device. Psychiatric: Alert and oriented x4.  Discharge Instructions   Discharge Instructions    Call MD for:  difficulty breathing, headache or visual disturbances   Complete by: As directed    Call MD for:  persistant dizziness or light-headedness   Complete by: As directed    Call MD for:  persistant nausea and  vomiting   Complete by: As directed    Call MD for:  temperature >100.4   Complete by: As directed    Diet - low sodium heart healthy   Complete by: As directed    Diet general   Complete by: As directed    Discharge instructions   Complete by: As directed    Please  do not drink alcoholic beverages or use illegal drugs such as marijuana, cocaine or unprescribed narcotics  You have been found to have elevated blood pressure which is a new medical problem for you.  Please take the prescribed medication amlodipine/Norvasc as prescribed.  You will need to follow-up with a primary care physician to monitor your blood pressure regularly.  Because of recent issues with depression that led to excessive use of alcohol and other drugs recommended that you follow-up with psychiatry after discharge.  Resources will be provided upon discharge.  In addition you have been prescribed Lexapro/Escitalopram to take to help with any depressive symptoms.  This medication can take up to 6 weeks to achieve optimal effect and may need to be increased by your family physician.  If not already done please complete section 8 housing application as provided to you by the licensed social Automotive engineerworker/care manager   Increase activity slowly   Complete by: As directed    No wound care   Complete by: As directed      Allergies as of 09/13/2019      Reactions   Peanut-containing Drug Products       Medication List    TAKE these medications   acetaminophen 325 MG tablet Commonly known as: TYLENOL Take 2 tablets (650 mg total) by mouth every 4 (four) hours as needed for fever or mild pain.   amLODipine 5 MG tablet Commonly known as: NORVASC Take 1 tablet (5 mg total) by mouth daily.   escitalopram 10 MG tablet Commonly known as: LEXAPRO Take 1 tablet (10 mg total) by mouth daily.   magnesium oxide 400 (241.3 Mg) MG tablet Commonly known as: MAG-OX Take 1 tablet (400 mg total) by mouth 2 (two) times daily.   melatonin 5 MG Tabs Take 1 tablet (5 mg total) by mouth daily.      Allergies  Allergen Reactions  . Peanut-Containing Drug Products     Follow-up Information     [facility] Go on 10/03/2019.   Why: @ 9:30a for hospital followup Contact  information: [address] [place] [id number] [phone]               The results of significant diagnostics from this hospitalization (including imaging, microbiology, ancillary and laboratory) are listed below for reference.    Significant Diagnostic Studies: DG Shoulder Right  Result Date: 09/08/2019 CLINICAL DATA:  Right shoulder pain, no known injury EXAM: RIGHT SHOULDER - 2+ VIEW COMPARISON:  None. FINDINGS: There is no evidence of fracture or dislocation. There is no evidence of arthropathy or other focal bone abnormality. Right upper extremity PICC. IMPRESSION: No fracture or dislocation of the right shoulder. Joint spaces are well preserved. No radiographic abnormality of the right shoulder. Electronically Signed   By: Lauralyn PrimesAlex  Bibbey M.D.   On: 09/08/2019 13:31   DG Abd 1 View  Result Date: 09/08/2019 CLINICAL DATA:  Overdose, RIGHT shoulder and abdominal pain, altered level of consciousness EXAM: ABDOMEN - 1 VIEW COMPARISON:  Portable exam 1117 hours compared to 08/17/2019 FINDINGS: Increased stool throughout colon. Small bowel gas  pattern normal. Slight gaseous distention of stomach. Osseous structures unremarkable. No urinary tract calcifications. IMPRESSION: Increased stool throughout colon question constipation. Electronically Signed   By: Ulyses Southward M.D.   On: 09/08/2019 13:48   DG Abd 1 View  Result Date: 08/16/2019 CLINICAL DATA:  NG tube placement. EXAM: ABDOMEN - 1 VIEW COMPARISON:  Prior study 08/16/2019. FINDINGS: NG tube noted with tip coiled in stomach in stable location. PICC line noted with tip at cavoatrial junction. Cardiomegaly. Bibasilar atelectasis. Left pleural effusion cannot be excluded. IMPRESSION: NG tube noted with tip coiled in stomach in stable location. Electronically Signed   By: Maisie Fus  Register   On: 08/16/2019 13:31   DG Abd 1 View  Result Date: 08/16/2019 CLINICAL DATA:  Nasogastric tube placement. EXAM: ABDOMEN - 1 VIEW  COMPARISON:  July 25, 2019. FINDINGS: Stable cardiomediastinal silhouette. Nasogastric tube tip is seen in the proximal stomach. No abnormal bowel gas pattern is noted. IMPRESSION: Nasogastric tube tip seen in proximal stomach. Electronically Signed   By: Lupita Raider M.D.   On: 08/16/2019 12:20   DG CHEST PORT 1 VIEW  Result Date: 08/27/2019 EXAM: PORTABLE CHEST 1 VIEW COMPARISON:  08/19/2019 FINDINGS: Endotracheal tube and feeding tube unchanged. Stable cardiac silhouette. There is increase in bibasilar effusions. Potential bibasilar airspace disease. Upper lungs clear. IMPRESSION: 1. Increase in bilateral pleural effusions. Potential increase in lower lobe airspace disease. 2. Stable support apparatus. Electronically Signed   By: Genevive Bi M.D.   On: 08/27/2019 14:52   DG CHEST PORT 1 VIEW  Result Date: 08/19/2019 CLINICAL DATA:  Acute respiratory failure EXAM: PORTABLE CHEST 1 VIEW COMPARISON:  September 14, 2018 FINDINGS: The mediastinal contour is stable. The heart size is enlarged. Tracheostomy tube is identified with distal tip 4.2 cm from carina. Nasogastric tube and left central venous line are unchanged. Increased pulmonary interstitium with enlarged central pulmonary vessel caliber are identified bilaterally. Patchy opacity of bilateral bases are noted. Small bilateral pleural effusions are identified. Bony structures are stable. IMPRESSION: 1. Mild congestive heart failure. 2. Patchy opacity of bilateral bases, superimposed pneumonia is not excluded, unchanged compared prior exam. Electronically Signed   By: Sherian Rein M.D.   On: 08/19/2019 07:40   DG CHEST PORT 1 VIEW  Result Date: 08/18/2019 CLINICAL DATA:  Fever. EXAM: PORTABLE CHEST 1 VIEW COMPARISON:  08/14/2019 FINDINGS: Tracheostomy tube unchanged. Left-sided PICC line has been advanced with tip within the right atrium just above the inferior cavoatrial junction. This could be pulled back approximately 8 cm. Enteric tube has tip  over the stomach in the left upper quadrant. Lungs are hypoinflated demonstrate persistent hazy bilateral perihilar and bibasilar opacification with possible mild interval improvement. Findings may be due to interstitial edema with small bilateral pleural effusions and basilar atelectasis. Infection is also possible. Mild stable cardiomegaly. Remainder of the exam is unchanged. IMPRESSION: 1. Possible slight interval improvement of hazy bilateral perihilar and bibasilar opacification which may be due to mild interstitial edema with small bilateral pleural effusions/bibasilar atelectasis. Infection is also possible. 2. Tubes and lines as described. Note that there is been interval advancement of the left sided PICC line as tip lies over the right atrium just above the inferior cavoatrial junction. This could be pulled back 8 cm. These results were called by telephone at the time of interpretation on 08/18/2019 at 11:40 am to patient's nurse, Vonna Kotyk, who verbally acknowledged these results. Electronically Signed   By: Elberta Fortis M.D.   On: 08/18/2019  11:40   DG Abd Portable 1V  Result Date: 08/17/2019 CLINICAL DATA:  Check feeding catheter placement EXAM: PORTABLE ABDOMEN - 1 VIEW COMPARISON:  None. FINDINGS: Feeding catheter is noted with the tip in the fundus of the stomach. Patchy airspace opacity in the left base is noted. No obstructive changes are seen. IMPRESSION: Feeding catheter within the fundus of the stomach. Electronically Signed   By: Alcide Clever M.D.   On: 08/17/2019 14:34   [place] EKG SITE RITE  Result Date: 09/06/2019 If Site Rite image not attached, placement could not be confirmed due to current cardiac rhythm.   Microbiology: No results found for this or any previous visit (from the past 240 hour(s)).   Labs: Basic Metabolic Panel: Recent Labs  Lab 09/10/19 0833 09/12/19 1056  NA 135 135  K 3.4* 4.3  CL 101 100  CO2 26 26  GLUCOSE 108* 98  BUN <5* <5*  CREATININE 0.77  0.76  CALCIUM 8.5* 9.0  MG 1.4* 1.7  PHOS  --  4.3   Liver Function Tests: Recent Labs  Lab 09/10/19 0833  AST 22  ALT 21  ALKPHOS 88  BILITOT 0.4  PROT 7.5  ALBUMIN 2.6*   No results for input(s): LIPASE, AMYLASE in the last 168 hours. Recent Labs  Lab 09/10/19 0833  AMMONIA 29   CBC: Recent Labs  Lab 09/10/19 0833 09/12/19 1056  WBC 7.5 9.5  NEUTROABS 3.7 5.8  HGB 10.7* 10.8*  HCT 33.5* 33.2*  MCV 85.2 84.1  PLT 224 250   Cardiac Enzymes: No results for input(s): CKTOTAL, CKMB, CKMBINDEX, TROPONINI in the last 168 hours. BNP: BNP (last 3 results) No results for input(s): BNP in the last 8760 hours.  ProBNP (last 3 results) No results for input(s): PROBNP in the last 8760 hours.  CBG: No results for input(s): GLUCAP in the last 168 hours.     Signed:  Junious Silk ANP  [facility] 09/13/2019, 9:47 AM

## 2019-10-03 ENCOUNTER — Inpatient Hospital Stay: Payer: Self-pay | Admitting: Physician Assistant

## 2021-01-18 IMAGING — DX DG CHEST 1V PORT
1 series · 1 of 1 positions shown · non-contrast
Comparison: One-view chest x-ray 08/04/2019

CLINICAL DATA: Intubation.  Respiratory failure.

EXAM:
PORTABLE CHEST 1 VIEW

[chest]
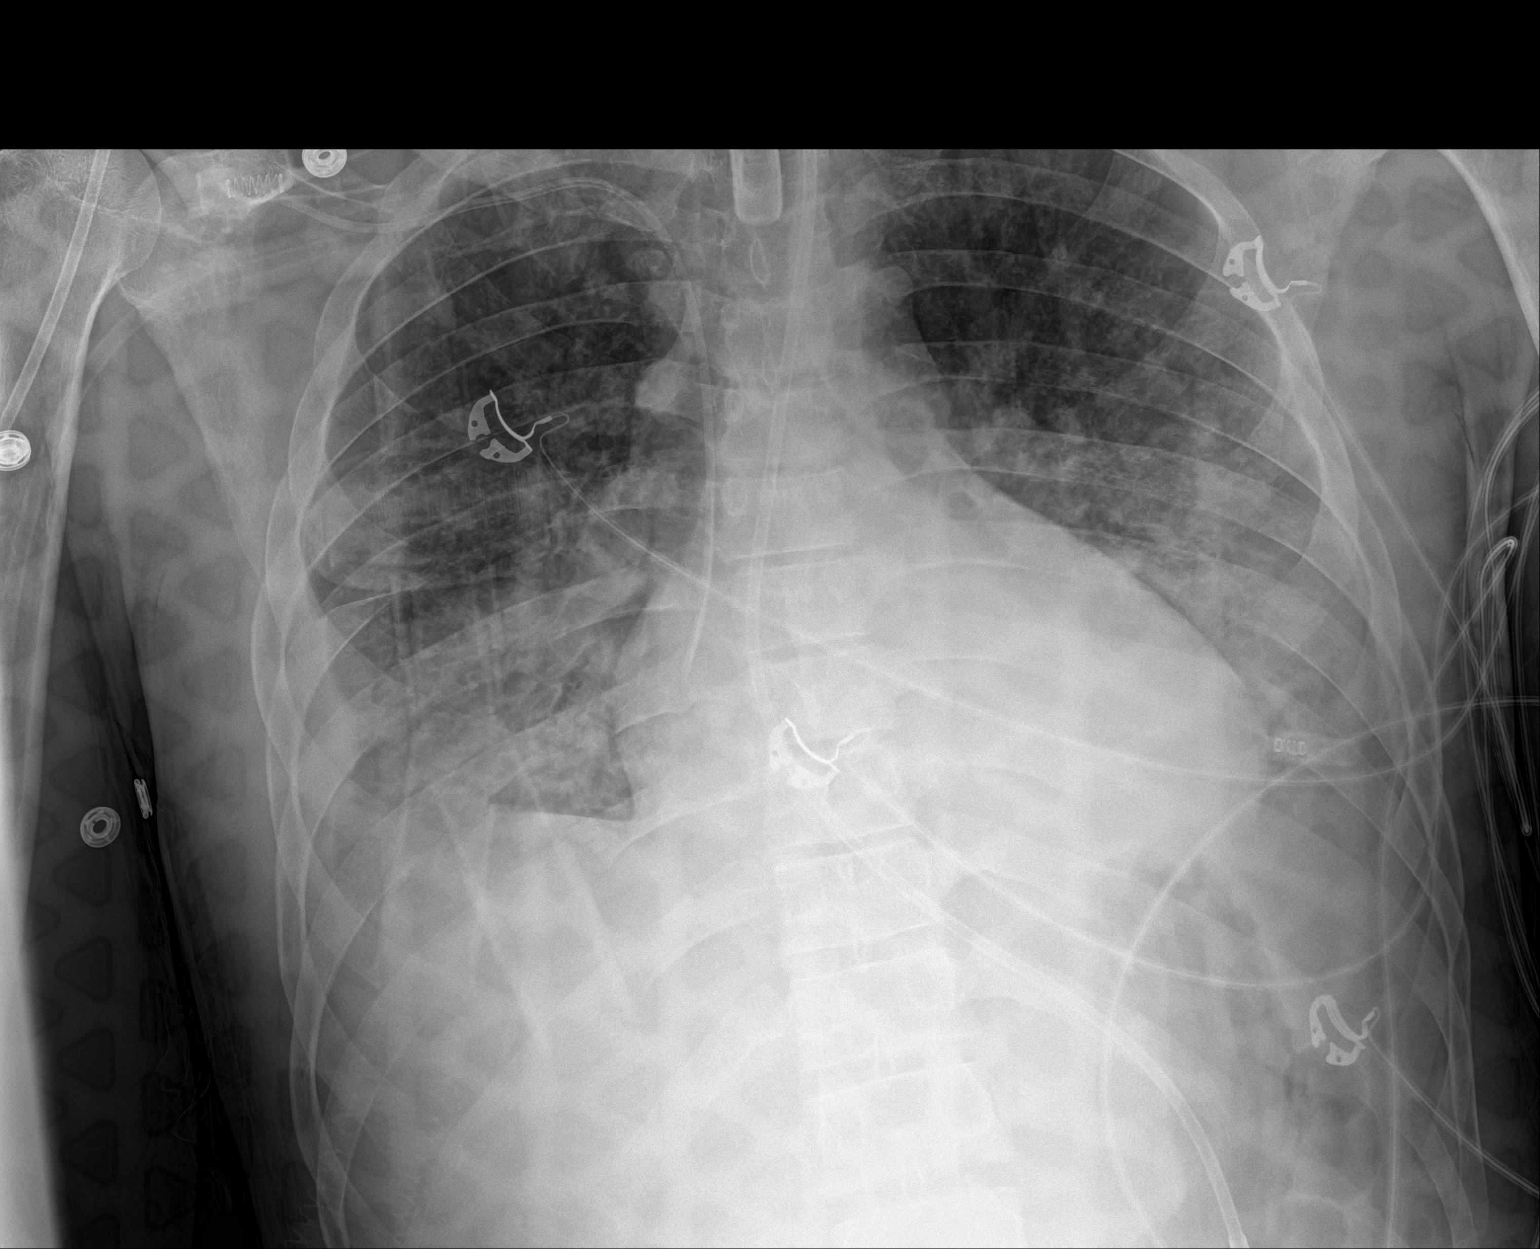

[1 of 1 positions shown; findings below may reference images not displayed]

FINDINGS: Heart size is exaggerated by low lung volumes. Tracheostomy tube is
stable. Feeding tube courses off the inferior border of the film.
Right subclavian line is stable. Right-sided chest tube was removed.

Detail is obscured by overlying warming blanket. Increasing
bilateral pleural effusions and basilar airspace disease is noted.
Mild edema has increased.
IMPRESSION: 1. Increasing bilateral pleural effusions and basilar airspace
disease, likely atelectasis. Infection is not excluded.
2. Increasing edema.
3. Interval removal of right-sided chest tube. No pneumothorax.
4. Tracheostomy in other support apparatus is otherwise stable.

## 2021-01-25 IMAGING — DX DG CHEST 1V PORT
1 series · 1 of 1 positions shown · non-contrast
Comparison: 08/12/2019

CLINICAL DATA: Pneumothorax.

EXAM:
PORTABLE CHEST 1 VIEW

[chest ap]
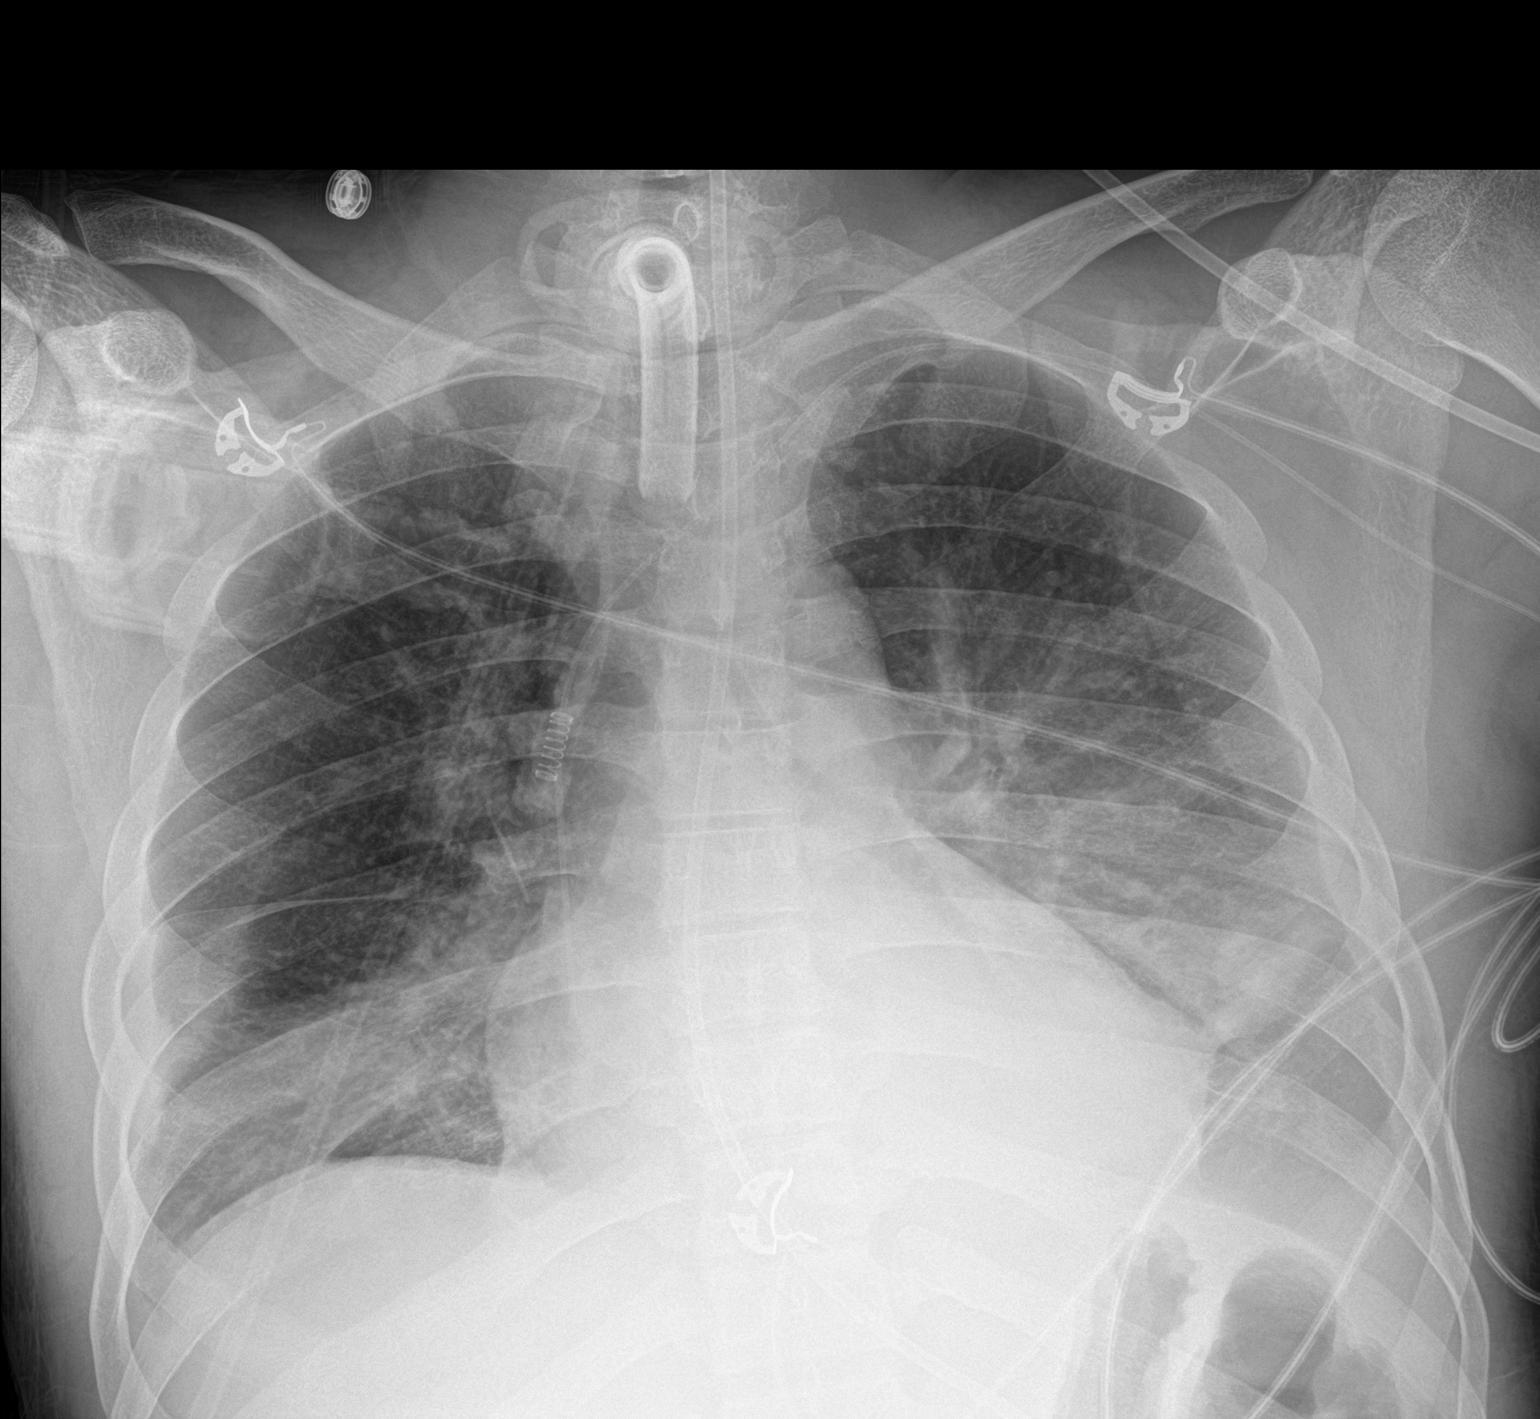

[1 of 1 positions shown; findings below may reference images not displayed]

FINDINGS: The tracheostomy tube is in good position, unchanged.

Feeding tube is coursing down the esophagus and into the stomach.

Left PICC line is stable.

Stable to slightly improved bibasilar airspace process. No definite
pneumothorax.
IMPRESSION: 1. Stable support apparatus.
2. Stable to slightly improved bibasilar airspace process.

## 2021-01-28 IMAGING — DX DG ABD PORTABLE 1V
1 series · 1 of 1 positions shown · non-contrast
Comparison: None.

CLINICAL DATA: Check feeding catheter placement

EXAM:
PORTABLE ABDOMEN - 1 VIEW

[abdomen kub]
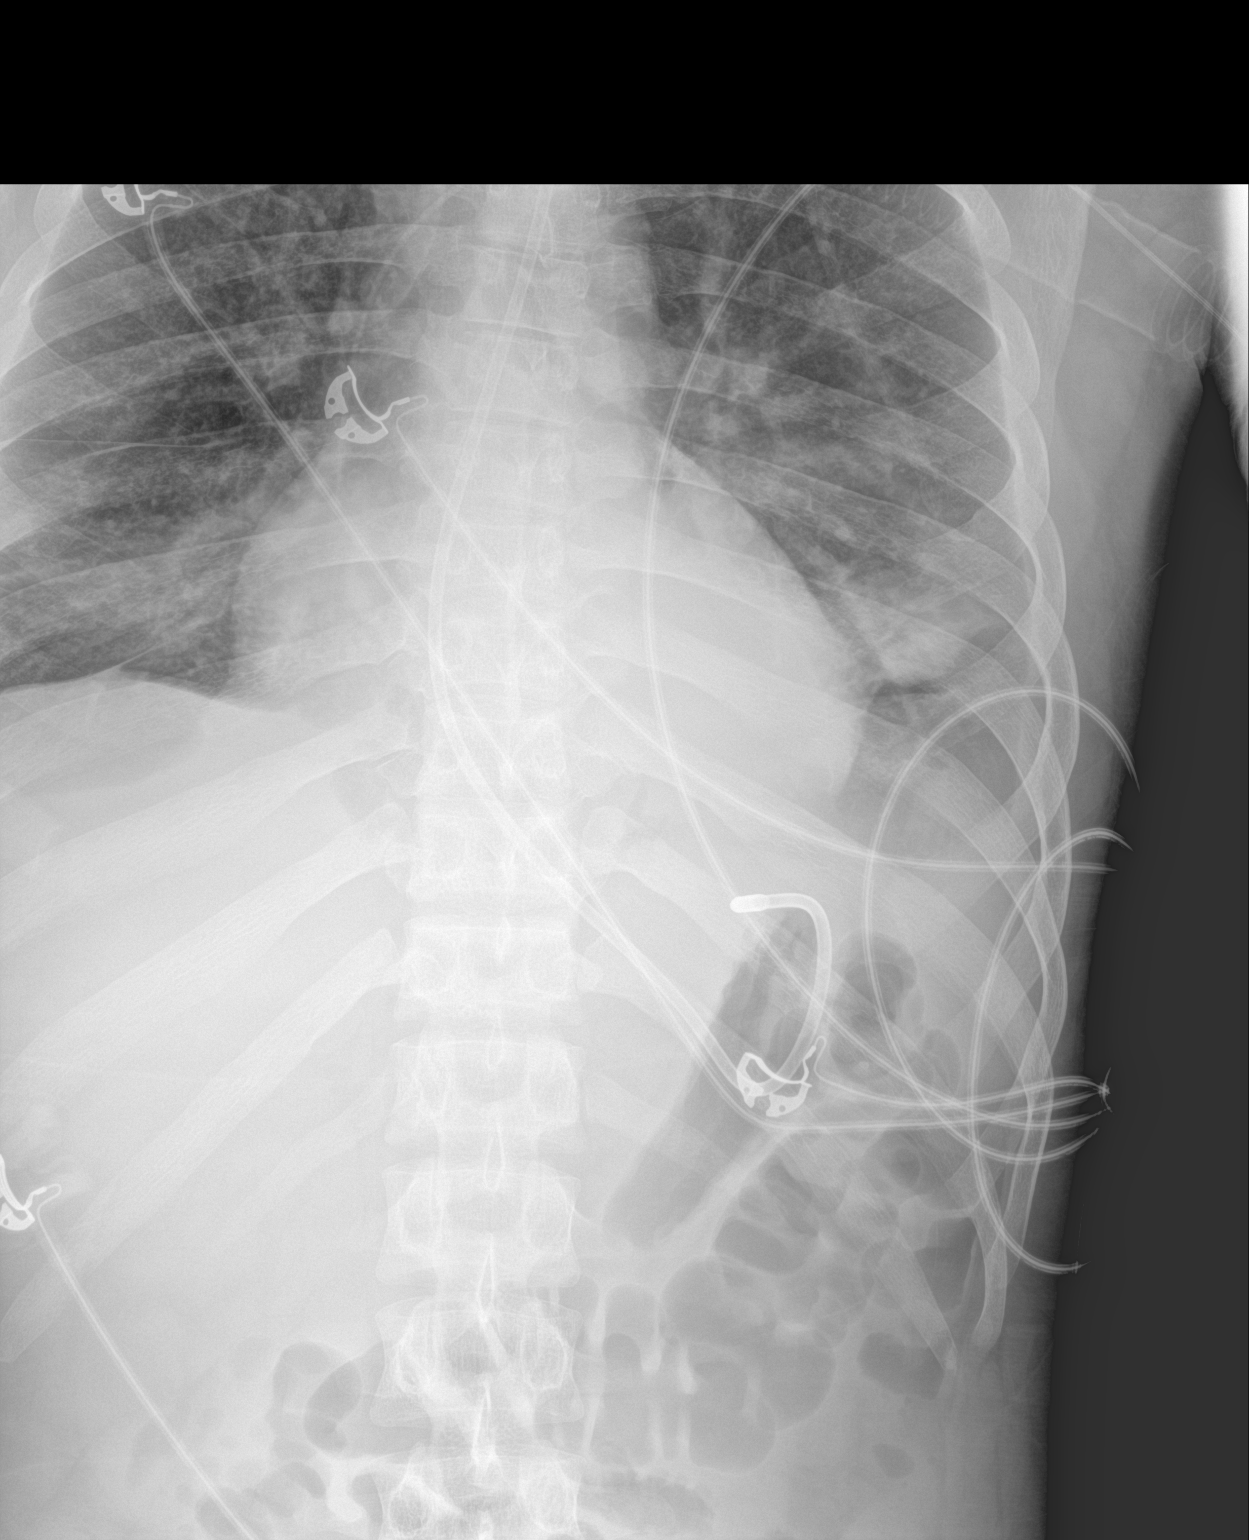

[1 of 1 positions shown; findings below may reference images not displayed]

FINDINGS: Feeding catheter is noted with the tip in the fundus of the stomach.
Patchy airspace opacity in the left base is noted. No obstructive
changes are seen.
IMPRESSION: Feeding catheter within the fundus of the stomach.
